# Patient Record
Sex: Female | Born: 1935 | Race: Black or African American | Hispanic: No | Smoking: Never smoker
Health system: Southern US, Community
[De-identification: ages and names within clinical notes are randomized; demographics above are authoritative.]

## PROBLEM LIST (undated history)

## (undated) DIAGNOSIS — E1110 Type 2 diabetes mellitus with ketoacidosis without coma: Secondary | ICD-10-CM

## (undated) DIAGNOSIS — R002 Palpitations: Secondary | ICD-10-CM

## (undated) DIAGNOSIS — G4700 Insomnia, unspecified: Secondary | ICD-10-CM

## (undated) DIAGNOSIS — E785 Hyperlipidemia, unspecified: Secondary | ICD-10-CM

## (undated) DIAGNOSIS — I10 Essential (primary) hypertension: Secondary | ICD-10-CM

## (undated) DIAGNOSIS — K219 Gastro-esophageal reflux disease without esophagitis: Secondary | ICD-10-CM

## (undated) DIAGNOSIS — E1140 Type 2 diabetes mellitus with diabetic neuropathy, unspecified: Secondary | ICD-10-CM

## (undated) HISTORY — DX: Hyperlipidemia, unspecified: E78.5

## (undated) HISTORY — PX: COLONOSCOPY: SHX174

## (undated) HISTORY — DX: Type 2 diabetes mellitus with diabetic neuropathy, unspecified: E11.40

## (undated) HISTORY — DX: Palpitations: R00.2

## (undated) HISTORY — PX: DILATION AND CURETTAGE OF UTERUS: SHX78

## (undated) HISTORY — PX: EYE SURGERY: SHX253

## (undated) HISTORY — DX: Insomnia, unspecified: G47.00

## (undated) HISTORY — DX: Essential (primary) hypertension: I10

---

## 1998-05-15 ENCOUNTER — Emergency Department (HOSPITAL_COMMUNITY): Admission: EM | Admit: 1998-05-15 | Discharge: 1998-05-15 | Payer: Self-pay | Admitting: Emergency Medicine

## 1998-06-29 ENCOUNTER — Encounter: Admission: RE | Admit: 1998-06-29 | Discharge: 1998-06-29 | Payer: Self-pay | Admitting: *Deleted

## 2002-11-13 HISTORY — PX: LAPAROSCOPIC APPENDECTOMY: SUR753

## 2002-11-13 HISTORY — PX: UMBILICAL HERNIA REPAIR: SHX196

## 2003-04-13 ENCOUNTER — Emergency Department (HOSPITAL_COMMUNITY): Admission: EM | Admit: 2003-04-13 | Discharge: 2003-04-13 | Payer: Self-pay | Admitting: Emergency Medicine

## 2003-04-14 ENCOUNTER — Encounter: Payer: Self-pay | Admitting: Emergency Medicine

## 2003-08-10 LAB — HM COLONOSCOPY: HM Colonoscopy: ABNORMAL

## 2003-08-19 ENCOUNTER — Ambulatory Visit (HOSPITAL_COMMUNITY): Admission: RE | Admit: 2003-08-19 | Discharge: 2003-08-19 | Payer: Self-pay | Admitting: Gastroenterology

## 2003-08-19 ENCOUNTER — Encounter: Payer: Self-pay | Admitting: Gastroenterology

## 2003-09-19 ENCOUNTER — Inpatient Hospital Stay (HOSPITAL_COMMUNITY): Admission: EM | Admit: 2003-09-19 | Discharge: 2003-09-20 | Payer: Self-pay | Admitting: Emergency Medicine

## 2003-09-20 ENCOUNTER — Encounter (INDEPENDENT_AMBULATORY_CARE_PROVIDER_SITE_OTHER): Payer: Self-pay | Admitting: Specialist

## 2004-03-02 ENCOUNTER — Ambulatory Visit (HOSPITAL_COMMUNITY): Admission: RE | Admit: 2004-03-02 | Discharge: 2004-03-02 | Payer: Self-pay

## 2006-11-28 ENCOUNTER — Ambulatory Visit: Payer: Self-pay | Admitting: Endocrinology

## 2006-12-19 ENCOUNTER — Ambulatory Visit: Payer: Self-pay | Admitting: Endocrinology

## 2007-01-09 ENCOUNTER — Ambulatory Visit: Payer: Self-pay | Admitting: Endocrinology

## 2007-01-30 ENCOUNTER — Ambulatory Visit: Payer: Self-pay | Admitting: Endocrinology

## 2007-01-30 LAB — CONVERTED CEMR LAB: TSH: 0.68 microintl units/mL (ref 0.35–5.50)

## 2007-02-12 ENCOUNTER — Ambulatory Visit: Payer: Self-pay | Admitting: Endocrinology

## 2007-02-19 ENCOUNTER — Ambulatory Visit: Payer: Self-pay

## 2007-02-19 HISTORY — PX: OTHER SURGICAL HISTORY: SHX169

## 2007-03-05 ENCOUNTER — Ambulatory Visit: Payer: Self-pay | Admitting: Endocrinology

## 2007-05-07 ENCOUNTER — Ambulatory Visit: Payer: Self-pay | Admitting: Endocrinology

## 2007-05-07 LAB — CONVERTED CEMR LAB
BUN: 14 mg/dL (ref 6–23)
CO2: 30 meq/L (ref 19–32)
Calcium: 10.2 mg/dL (ref 8.4–10.5)
Chloride: 103 meq/L (ref 96–112)
Creatinine, Ser: 0.9 mg/dL (ref 0.4–1.2)
Creatinine,U: 77.7 mg/dL
GFR calc Af Amer: 80 mL/min
GFR calc non Af Amer: 66 mL/min
Glucose, Bld: 94 mg/dL (ref 70–99)
Hgb A1c MFr Bld: 8.4 % — ABNORMAL HIGH (ref 4.6–6.0)
Microalb Creat Ratio: 2.6 mg/g (ref 0.0–30.0)
Microalb, Ur: 0.2 mg/dL (ref 0.0–1.9)
Potassium: 4.3 meq/L (ref 3.5–5.1)
Sodium: 141 meq/L (ref 135–145)

## 2007-08-13 ENCOUNTER — Encounter: Payer: Self-pay | Admitting: *Deleted

## 2007-08-13 DIAGNOSIS — I10 Essential (primary) hypertension: Secondary | ICD-10-CM | POA: Insufficient documentation

## 2007-08-13 DIAGNOSIS — R635 Abnormal weight gain: Secondary | ICD-10-CM | POA: Insufficient documentation

## 2007-08-13 DIAGNOSIS — E1110 Type 2 diabetes mellitus with ketoacidosis without coma: Secondary | ICD-10-CM

## 2007-08-13 DIAGNOSIS — G4700 Insomnia, unspecified: Secondary | ICD-10-CM

## 2007-08-13 DIAGNOSIS — E1040 Type 1 diabetes mellitus with diabetic neuropathy, unspecified: Secondary | ICD-10-CM | POA: Insufficient documentation

## 2007-08-13 DIAGNOSIS — E785 Hyperlipidemia, unspecified: Secondary | ICD-10-CM | POA: Insufficient documentation

## 2007-08-13 HISTORY — DX: Type 2 diabetes mellitus with ketoacidosis without coma: E11.10

## 2007-08-13 HISTORY — DX: Insomnia, unspecified: G47.00

## 2007-08-13 HISTORY — DX: Hyperlipidemia, unspecified: E78.5

## 2007-08-13 HISTORY — DX: Essential (primary) hypertension: I10

## 2007-08-14 ENCOUNTER — Encounter: Payer: Self-pay | Admitting: Endocrinology

## 2007-08-14 ENCOUNTER — Ambulatory Visit: Payer: Self-pay | Admitting: Endocrinology

## 2007-08-14 LAB — CONVERTED CEMR LAB: Hgb A1c MFr Bld: 7.7 % — ABNORMAL HIGH (ref 4.6–6.0)

## 2007-11-15 ENCOUNTER — Ambulatory Visit: Payer: Self-pay | Admitting: Endocrinology

## 2007-11-15 LAB — CONVERTED CEMR LAB: Hgb A1c MFr Bld: 7.7 % — ABNORMAL HIGH (ref 4.6–6.0)

## 2007-12-04 ENCOUNTER — Telehealth: Payer: Self-pay | Admitting: Internal Medicine

## 2008-01-02 ENCOUNTER — Encounter: Payer: Self-pay | Admitting: Endocrinology

## 2008-01-09 ENCOUNTER — Encounter: Payer: Self-pay | Admitting: Endocrinology

## 2008-03-16 ENCOUNTER — Ambulatory Visit: Payer: Self-pay | Admitting: Endocrinology

## 2008-03-16 DIAGNOSIS — M79609 Pain in unspecified limb: Secondary | ICD-10-CM | POA: Insufficient documentation

## 2008-03-16 LAB — CONVERTED CEMR LAB: Hgb A1c MFr Bld: 7.6 % — ABNORMAL HIGH (ref 4.6–6.0)

## 2008-06-15 ENCOUNTER — Ambulatory Visit: Payer: Self-pay | Admitting: Endocrinology

## 2008-06-15 LAB — CONVERTED CEMR LAB: Hgb A1c MFr Bld: 7.4 % — ABNORMAL HIGH (ref 4.6–6.0)

## 2008-06-17 ENCOUNTER — Telehealth: Payer: Self-pay | Admitting: Endocrinology

## 2008-06-18 ENCOUNTER — Ambulatory Visit: Payer: Self-pay

## 2008-06-18 ENCOUNTER — Encounter: Payer: Self-pay | Admitting: Endocrinology

## 2008-09-17 ENCOUNTER — Ambulatory Visit: Payer: Self-pay | Admitting: Endocrinology

## 2008-09-17 LAB — CONVERTED CEMR LAB: Hgb A1c MFr Bld: 7.3 % — ABNORMAL HIGH (ref 4.6–6.0)

## 2008-12-17 ENCOUNTER — Ambulatory Visit: Payer: Self-pay | Admitting: Endocrinology

## 2008-12-17 LAB — CONVERTED CEMR LAB: Hgb A1c MFr Bld: 7.6 % — ABNORMAL HIGH (ref 4.6–6.0)

## 2009-03-03 ENCOUNTER — Encounter: Payer: Self-pay | Admitting: Endocrinology

## 2009-03-18 ENCOUNTER — Ambulatory Visit: Payer: Self-pay | Admitting: Endocrinology

## 2009-03-18 LAB — CONVERTED CEMR LAB: Hgb A1c MFr Bld: 7.5 % — ABNORMAL HIGH (ref 4.6–6.5)

## 2009-04-14 ENCOUNTER — Telehealth (INDEPENDENT_AMBULATORY_CARE_PROVIDER_SITE_OTHER): Payer: Self-pay | Admitting: *Deleted

## 2009-05-05 ENCOUNTER — Encounter: Payer: Self-pay | Admitting: Endocrinology

## 2009-06-17 ENCOUNTER — Ambulatory Visit: Payer: Self-pay | Admitting: Endocrinology

## 2009-06-17 LAB — CONVERTED CEMR LAB
Creatinine,U: 113.4 mg/dL
Hgb A1c MFr Bld: 8 % — ABNORMAL HIGH (ref 4.6–6.5)
Microalb Creat Ratio: 1.8 mg/g (ref 0.0–30.0)
Microalb, Ur: 0.2 mg/dL (ref 0.0–1.9)

## 2009-09-17 ENCOUNTER — Ambulatory Visit: Payer: Self-pay | Admitting: Endocrinology

## 2009-09-17 LAB — CONVERTED CEMR LAB: Hgb A1c MFr Bld: 8.3 % — ABNORMAL HIGH (ref 4.6–6.5)

## 2009-12-17 ENCOUNTER — Ambulatory Visit: Payer: Self-pay | Admitting: Endocrinology

## 2009-12-17 LAB — CONVERTED CEMR LAB: Hgb A1c MFr Bld: 8.4 % — ABNORMAL HIGH (ref 4.6–6.5)

## 2010-02-07 ENCOUNTER — Telehealth: Payer: Self-pay | Admitting: Endocrinology

## 2010-03-18 ENCOUNTER — Ambulatory Visit: Payer: Self-pay | Admitting: Endocrinology

## 2010-03-18 LAB — CONVERTED CEMR LAB: Hgb A1c MFr Bld: 7.9 % — ABNORMAL HIGH (ref 4.6–6.5)

## 2010-08-26 ENCOUNTER — Ambulatory Visit: Payer: Self-pay | Admitting: Endocrinology

## 2010-08-26 LAB — CONVERTED CEMR LAB: Hgb A1c MFr Bld: 8.4 % — ABNORMAL HIGH (ref 4.6–6.5)

## 2010-09-23 ENCOUNTER — Emergency Department (HOSPITAL_COMMUNITY): Admission: EM | Admit: 2010-09-23 | Discharge: 2010-09-23 | Payer: Self-pay | Admitting: *Deleted

## 2010-09-23 ENCOUNTER — Emergency Department (HOSPITAL_COMMUNITY): Admission: EM | Admit: 2010-09-23 | Discharge: 2010-09-23 | Payer: Self-pay | Admitting: Emergency Medicine

## 2010-10-31 ENCOUNTER — Encounter
Admission: RE | Admit: 2010-10-31 | Discharge: 2010-10-31 | Payer: Self-pay | Source: Home / Self Care | Attending: Orthopaedic Surgery | Admitting: Orthopaedic Surgery

## 2010-11-17 ENCOUNTER — Encounter: Payer: Self-pay | Admitting: Endocrinology

## 2010-11-18 ENCOUNTER — Telehealth (INDEPENDENT_AMBULATORY_CARE_PROVIDER_SITE_OTHER): Payer: Self-pay | Admitting: *Deleted

## 2010-11-25 ENCOUNTER — Ambulatory Visit
Admission: RE | Admit: 2010-11-25 | Discharge: 2010-11-25 | Payer: Self-pay | Source: Home / Self Care | Attending: Endocrinology | Admitting: Endocrinology

## 2010-11-25 ENCOUNTER — Other Ambulatory Visit: Payer: Self-pay | Admitting: Endocrinology

## 2010-11-25 LAB — HEMOGLOBIN A1C: Hgb A1c MFr Bld: 8.4 % — ABNORMAL HIGH (ref 4.6–6.5)

## 2010-12-08 ENCOUNTER — Telehealth (INDEPENDENT_AMBULATORY_CARE_PROVIDER_SITE_OTHER): Payer: Self-pay | Admitting: *Deleted

## 2010-12-12 ENCOUNTER — Encounter: Payer: Self-pay | Admitting: Endocrinology

## 2010-12-13 NOTE — Assessment & Plan Note (Signed)
Summary: 3 MO ROV /$50 /NWS   Vital Signs:  Patient Profile:   75 Years Old Female Weight:      190.0 pounds O2 Sat:      97 % O2 treatment:    Room Air Temp:     97.1 degrees F oral Pulse rate:   72 / minute BP sitting:   122 / 78  (left arm) Cuff size:   regular  Pt. in pain?   no  Vitals Entered By: Orlan Leavens (September 17, 2008 1:06 PM)                  Referred by:  shelton PCP:  shelton  Chief Complaint:  3 month follow-up/ also want flu shot.  History of Present Illness: pt feels well.  she brings a record of her cbg's which i have reviewed today.  it varies from 70's (before lunch) to mid-100's (am).  better overall.    Prior Medications Reviewed Using: Patient Recall  Updated Prior Medication List: HUMALOG 100 UNIT/ML  SOLN (INSULIN LISPRO (HUMAN)) qac three times a day) 06-22-12 units ACTOS 15 MG  TABS (PIOGLITAZONE HCL) take 1 by mouth qd NEURONTIN 100 MG  CAPS (GABAPENTIN) take 1 by mouth three times a day qd PRAVASTATIN SODIUM 20 MG  TABS (PRAVASTATIN SODIUM) take 1 by mouth qd METFORMIN HCL 1000 MG  TABS (METFORMIN HCL) take 1 by mouth q am & 1/2 by mouth qhs AMBIEN 5 MG  TABS (ZOLPIDEM TARTRATE) take 1 by mouth at bedtime prn METFORMIN HCL 500 MG TABS (METFORMIN HCL) Take 1/2 tablet by mouth at bedtime RELION N 100 UNIT/ML SUSP (INSULIN ISOPHANE HUMAN) Inject 12 unit subcutaneously at bedtime ZIAC 2.5-6.25 MG TABS (BISOPROLOL-HYDROCHLOROTHIAZIDE) Take 1 tablet by mouth once a day METFORMIN HCL 500 MG  TB24 (METFORMIN HCL) take 2 by mouth q am and 1 pm once daily BD U/F SHORT PEN NEEDLE 31G X 8 MM  MISC (INSULIN PEN NEEDLE) use as directed three times a day  Current Allergies: No known allergies   Past Medical History:    Reviewed history from 06/15/2008 and no changes required:       FOOT PAIN, RIGHT (ICD-729.5)       WEIGHT GAIN (ICD-783.1)       INSOMNIA (ICD-780.52)       HYPERTENSION (ICD-401.9)       HYPERLIPIDEMIA (ICD-272.4)  DIABETES MELLITUS, TYPE I (ICD-250.01)            Review of Systems  The patient denies hypoglycemia and syncope.     Physical Exam  General:     well developed, well nourished, in no acute distress Psych:     alert and cooperative; normal mood and affect; normal attention span and concentration Additional Exam:     A1C%                 [H]  7.3 %      Impression & Recommendations:  Problem # 1:  DIABETES MELLITUS, TYPE I (ICD-250.01)  Medications Added to Medication List This Visit: 1)  Humalog 100 Unit/ml Soln (Insulin lispro (human)) .... Qac three times a day) 05-22-12 units  Other Orders: Flu Vaccine 74yrs + (65784) Administration Flu vaccine (O9629)   Patient Instructions: 1)  reduce humalog to (just before each meal) 05-23-11 2)  same nph (8 at night) 3)  ret 3 mos   Flu Vaccine Consent Questions     Do you have a history of severe allergic  reactions to this vaccine? no    Any prior history of allergic reactions to egg and/or gelatin? no    Do you have a sensitivity to the preservative Thimersol? no    Do you have a past history of Guillan-Barre Syndrome? no    Do you currently have an acute febrile illness? no    Have you ever had a severe reaction to latex? no    Vaccine information given and explained to patient? yes    Are you currently pregnant? no    Lot Number:[id number]   Exp Date:05/12/2009   Site Given Right Deltoid IM   ]

## 2010-12-13 NOTE — Medication Information (Signed)
Summary: Diabetic supplies/[facility]  Diabetic supplies/[facility]   Imported By: Lester Marvell 03/09/2009 09:34:41  _____________________________________________________________________  External Attachment:    Type:   Image     Comment:   External Document

## 2010-12-13 NOTE — Progress Notes (Signed)
  Phone Note Refill Request Message from:  Fax from Pharmacy on February 07, 2010 8:10 AM  Refills Requested: Medication #1:  HUMALOG KWIKPEN 100 UNIT/ML SOLN qac (three times a day) 06-21-13 units.   Dosage confirmed as above?Dosage Confirmed Initial call taken by: Josph Macho RMA,  February 07, 2010 8:10 AM    Prescriptions: HUMALOG KWIKPEN 100 UNIT/ML SOLN (INSULIN LISPRO (HUMAN)) qac (three times a day) 06-21-13 units  #1 box x 6   Entered by:   Josph Macho RMA   Authorized by:   Minus Breeding MD   Signed by:   Josph Macho RMA on 02/07/2010   Method used:   Electronically to        [facility]. [phone]* (retail)       [address]       [place], [place]  [address]       Ph: [phone]       Fax: [phone]   RxID:   [id number]

## 2010-12-13 NOTE — Medication Information (Signed)
Summary: Rx for Diabetic Supplies/[facility]  Rx for Diabetic Supplies/[facility]   Imported By: Esmeralda Links D'jimraou 01/13/2008 12:32:31  _____________________________________________________________________  External Attachment:    Type:   Image     Comment:   External Document

## 2010-12-13 NOTE — Assessment & Plan Note (Signed)
Summary: 3 MO ROV/NWS    Vital Signs:  Patient Profile:   75 Years Old Female Weight:      183.8 pounds Temp:     98.7 degrees F oral Pulse rate:   71 / minute BP sitting:   137 / 67  (right arm) Cuff size:   large  Vitals Entered By: Orlan Leavens (November 15, 2007 11:17 AM)                 Visit Type:  Follow-up Visit Referred by:  shelton PCP:  shelton  Chief Complaint:  dm.  History of Present Illness: patient states she has been feeling well recently.  She also states her diabetes has been well-controlled.  She brings with her record of her home glucoses, which I have reviewed today.  She has a few numbers below 100 before breakfast and occasionally before lunch, and are slightly higher before supper and bedtime.  However, the vast majority of her glucoses are in the low 100s, with a few in the mid 100s.  Current Allergies: No known allergies   Past Medical History:    Reviewed history from 08/13/2007 and no changes required:       Diabetes mellitus, type I       Hyperlipidemia       Hypertension     Review of Systems       denies hypoglycemia   Physical Exam  General:     obese.   Pulses:     dorsalis pedis intact bilat.   Extremities:     no deformity.  no ulcer on the feet.  feet are of normal color and temp.  no edema  Neurologic:     sensation is intact to touch on the feet Additional Exam:     a1c=7.7    Impression & Recommendations:  Problem # 1:  DIABETES MELLITUS, TYPE I (ICD-250.01)  Her updated medication list for this problem includes:    Humalog 100 Unit/ml Soln (Insulin lispro (human)) ..... See office notes    Actos 15 Mg Tabs (Pioglitazone hcl) .Marland Kitchen... Take 1 by mouth qd    Metformin Hcl 1000 Mg Tabs (Metformin hcl) .Marland Kitchen... Take 1 by mouth q am & 1/2 by mouth qhs    Novolog 100 Unit/ml Soln (Insulin aspart) ..... See office notes  Orders: TLB-A1C / Hgb A1C (Glycohemoglobin) (83036-A1C) Est. Patient Level III  (60630)      Patient Instructions: 1)  continue NPH insulin 7 units nightly 2)  increase Humalog to 8 units with breakfast, 10 with lunch, and 12 with supper. 3)  Return 4 months    ]  Appended Document: 3 MO ROV/NWS Faxed notes to Dr. Renae Gloss @ [phone]/LMB

## 2010-12-13 NOTE — Assessment & Plan Note (Signed)
Summary: 3 MOS F/U /# / CD  RS'D PER PT TO OCT/NWS   Vital Signs:  Patient profile:   75 year old female Height:      65 inches (165.10 cm) Weight:      176.75 pounds (80.34 kg) BMI:     29.52 O2 Sat:      98 % on Room air Temp:     97.9 degrees F (36.61 degrees C) oral Pulse rate:   69 / minute BP sitting:   132 / 76  (left arm) Cuff size:   regular  Vitals Entered By: Brenton Grills MA (August 26, 2010 1:20 PM)  O2 Flow:  Room air CC: 3 month F/U/aj Is Patient Diabetic? Yes   Referring Provider:  shelton Primary Provider:  shelton  CC:  3 month F/U/aj.  History of Present Illness: [name] brings a record of her cbg's which i have reviewed today.  most are in the 100's.  it is below 100 at hs, and before supper.  pt states [name] feels well in general.  Current Medications (verified): 1)  Neurontin 100 Mg  Caps (Gabapentin) .... Take 1 By Mouth Three Times A Day Qd 2)  Pravastatin Sodium 20 Mg  Tabs (Pravastatin Sodium) .... Take 1 By Mouth Qd 3)  Relion N 100 Unit/ml Susp (Insulin Isophane Human) .... Inject 9 Unit Subcutaneously At Bedtime 4)  Ziac 2.5-6.25 Mg Tabs (Bisoprolol-Hydrochlorothiazide) .... Take 1 Tablet By Mouth Once A Day 5)  Metformin Hcl 500 Mg  Tb24 (Metformin Hcl) .... Take 2 By Mouth Q Am and 1 Pm Once Daily 6)  Bd U/f Short Pen Needle 31g X 8 Mm  Misc (Insulin Pen Needle) .... Use As Directed 4 X A Day (Any Brand) 7)  Humalog Kwikpen 100 Unit/ml Soln (Insulin Lispro (Human)) .... Three Times A Day (Just Before Each Meal)  06-22-12 Units, and Pen Needles 4x A Day  Allergies (verified): No Known Drug Allergies  Past History:  Past Medical History: Last updated: 06/15/2008 FOOT PAIN, RIGHT (ICD-729.5) WEIGHT GAIN (ICD-783.1) INSOMNIA (ICD-780.52) HYPERTENSION (ICD-401.9) HYPERLIPIDEMIA (ICD-272.4) DIABETES MELLITUS, TYPE I (ICD-250.01)  Review of Systems  The patient denies hypoglycemia.    Physical Exam  General:  normal appearance.   Pulses:   dorsalis pedis intact bilat.   Extremities:  no deformity.  no ulcer on the feet.  feet are of normal color and temp.  no edema  Neurologic:  sensation is intact to touch on the feet  Additional Exam:   Hemoglobin A1C       [H]  8.4 %       Impression & Recommendations:  Problem # 1:  DIABETES MELLITUS, TYPE I (ICD-250.01) [name] needs some adjustment in her therapy  Medications Added to Medication List This Visit: 1)  Relion N 100 Unit/ml Susp (Insulin isophane human) .... Inject 12 units subcutaneously at bedtime 2)  Humalog Kwikpen 100 Unit/ml Soln (Insulin lispro (human)) .... Three times a day (just before each meal)  07-22-12 units, and pen needles 4x a day  Other Orders: Flu Vaccine 2yrs + MEDICARE PATIENTS (Z6109) Administration Flu vaccine - MCR (G0008) TLB-A1C / Hgb A1C (Glycohemoglobin) (83036-A1C) Est. Patient Level III ([address])  Patient Instructions: 1)  tests are being ordered for you today.  a few days after the test(s), please call [phone] to hear your test results. 2)  pending the test results: 3)  increase nph to 12 units at bedtime 4)  Please schedule a follow-up appointment in 3 months.  5)  reduce humalog to (just before each meal) 07-22-12 units. 6)  (update: i left message on phone-tree:  rx as we discussed) Prescriptions: HUMALOG KWIKPEN 100 UNIT/ML SOLN (INSULIN LISPRO (HUMAN)) three times a day (just before each meal)  07-22-12 units, and pen needles 4x a day  #1 box x 11   Entered and Authorized by:   Minus Breeding MD   Signed by:   Minus Breeding MD on 08/26/2010   Method used:   Print then Give to Patient   RxID:   [id number]  Flu Vaccine Consent Questions     Do you have a history of severe allergic reactions to this vaccine? no    Any prior history of allergic reactions to egg and/or gelatin? no    Do you have a sensitivity to the preservative Thimersol? no    Do you have a past history of Guillan-Barre Syndrome? no    Do you currently have an  acute febrile illness? no    Have you ever had a severe reaction to latex? no    Vaccine information given and explained to patient? yes    Are you currently pregnant? no    Lot Number:[id number]   Exp Date:05/13/2011   Site Given  Right Deltoid IMu1

## 2010-12-13 NOTE — Medication Information (Signed)
Summary: Rx for Diabetic Supplies/[facility]  Rx for Diabetic Supplies/[facility]   Imported By: Esmeralda Links D'jimraou 01/15/2008 14:47:54  _____________________________________________________________________  External Attachment:    Type:   Image     Comment:   External Document

## 2010-12-13 NOTE — Medication Information (Signed)
Summary: Glucose Testing Supplies/[facility]  Glucose Testing Supplies/[facility]   Imported By: Sherian Rein 05/19/2009 08:28:09  _____________________________________________________________________  External Attachment:    Type:   Image     Comment:   External Document

## 2010-12-13 NOTE — Assessment & Plan Note (Signed)
Summary: 4 MTH FU-STC   Vital Signs:  Patient Profile:   75 Years Old Female Weight:      188.0 pounds Temp:     98.3 degrees F oral Pulse rate:   76 / minute BP sitting:   126 / 70  (left arm) Cuff size:   regular  Vitals Entered By: Orlan Leavens (Mar 16, 2008 11:09 AM)                 Referred by:  shelton PCP:  shelton  Chief Complaint:  4 MONTH FOLLOW-UP.  History of Present Illness: patient says she's feeling well in general. She currently takes Humalog with breakfast, 10 with lunch, and 13 with supper. She also takes NPH 7 units q.h.s. She brings with her extensive record of her home glucoses which I have reviewed today. patient states 3 months of slight pain of the right foot, worse in the context of inversion of the foot. Its worst at the forefoot, but not in the toes.    Current Allergies: No known allergies   Past Medical History:    Reviewed history from 08/13/2007 and no changes required:       Diabetes mellitus, type I       Hyperlipidemia       Hypertension     Review of Systems  The patient denies suspicious skin lesions.         no hypoglycemia   Physical Exam  General:     normal appearance.   Pulses:     dorsalis pedis intact bilat.  no carotid bruit  Extremities:     no deformity.  no ulcer on the feet.  feet are of normal color and temp.  no edema  Neurologic:     sensation is intact to touch on the feet  Skin:     insulin injection sites at anterior abdomen are normal  Additional Exam:     A1C%                 [H]  7.6 %              Impression & Recommendations:  Problem # 1:  DIABETES MELLITUS, TYPE I (ICD-250.01)  The following medications were removed from the medication list:    Novolog 100 Unit/ml Soln (Insulin aspart) ..... See office notes  Her updated medication list for this problem includes:    Humalog 100 Unit/ml Soln (Insulin lispro (human)) ..... Qac three times a day) 06-22-12 units    Actos 15 Mg Tabs  (Pioglitazone hcl) .Marland Kitchen... Take 1 by mouth qd    Metformin Hcl 1000 Mg Tabs (Metformin hcl) .Marland Kitchen... Take 1 by mouth q am & 1/2 by mouth qhs    Metformin Hcl 500 Mg Tabs (Metformin hcl) .Marland Kitchen... Take 1/2 tablet by mouth at bedtime    Relion N 100 Unit/ml Susp (Insulin isophane human) ..... Inject 8 unit subcutaneously at bedtime    Metformin Hcl 500 Mg Tb24 (Metformin hcl) .Marland Kitchen... Take 2 by mouth q am and 1 pm once daily  Orders: TLB-A1C / Hgb A1C (Glycohemoglobin) (83036-A1C) Est. Patient Level IV ([address])   Problem # 2:  FOOT PAIN, RIGHT (ICD-729.5)  Orders: Podiatry Referral (Podiatry)   Medications Added to Medication List This Visit: 1)  Humalog 100 Unit/ml Soln (Insulin lispro (human)) .... Qac three times a day) 06-22-12 units 2)  Relion N 100 Unit/ml Susp (Insulin isophane human) .... Inject 7 unit subcutaneously at bedtime 3)  Relion N 100 Unit/ml Susp (Insulin isophane human) .... Inject 8 unit subcutaneously at bedtime   Patient Instructions: 1)  increase nph to 8 units 2)  same humalog 3)  ret 3 mos 4)  cc dr Kirtland Bouchard shelton 5)  ref podiatry    ]  Appended Document: 4 MTH FU-STC faxed notes to dr shelton @ [phone]/lmb

## 2010-12-13 NOTE — Assessment & Plan Note (Signed)
Summary: 3 MTH FU  STC   Vital Signs:  Patient profile:   75 year old female Height:      65 inches (165.10 cm) Weight:      178.25 pounds (81.02 kg) O2 Sat:      98 % on Room air Temp:     97.1 degrees F (36.17 degrees C) oral Pulse rate:   68 / minute BP sitting:   124 / 84  (left arm) Cuff size:   large  Vitals Entered By: Josph Macho CMA (December 17, 2009 12:58 PM)  O2 Flow:  Room air CC: 3 month follow up/ pt states she is no longer taking Ambien/ CF Is Patient Diabetic? Yes   Referring Provider:  shelton Primary Provider:  shelton  CC:  3 month follow up/ pt states she is no longer taking Ambien/ CF.  History of Present Illness: pt states she feels well in general.  she brings a record of her cbg's which i have reviewed today.  it is highest in am (mostly mid-100's) and lowest at hs (80-90).  it is lower before supper than before lunch.  Current Medications (verified): 1)  Neurontin 100 Mg  Caps (Gabapentin) .... Take 1 By Mouth Three Times A Day Qd 2)  Pravastatin Sodium 20 Mg  Tabs (Pravastatin Sodium) .... Take 1 By Mouth Qd 3)  Ambien 5 Mg  Tabs (Zolpidem Tartrate) .... Take 1 By Mouth At Bedtime Prn 4)  Metformin Hcl 500 Mg Tabs (Metformin Hcl) .... Take 1/2 Tablet By Mouth At Bedtime 5)  Relion N 100 Unit/ml Susp (Insulin Isophane Human) .... Inject 9 Unit Subcutaneously At Bedtime 6)  Ziac 2.5-6.25 Mg Tabs (Bisoprolol-Hydrochlorothiazide) .... Take 1 Tablet By Mouth Once A Day 7)  Metformin Hcl 500 Mg  Tb24 (Metformin Hcl) .... Take 2 By Mouth Q Am and 1 Pm Once Daily 8)  Bd U/f Short Pen Needle 31g X 8 Mm  Misc (Insulin Pen Needle) .... Use As Directed Three Times A Day 9)  Humalog Kwikpen 100 Unit/ml Soln (Insulin Lispro (Human)) .... Qac (Three Times A Day) 06-21-13 Units  Allergies (verified): No Known Drug Allergies  Past History:  Past Medical History: Last updated: 06/15/2008 FOOT PAIN, RIGHT (ICD-729.5) WEIGHT GAIN (ICD-783.1) INSOMNIA  (ICD-780.52) HYPERTENSION (ICD-401.9) HYPERLIPIDEMIA (ICD-272.4) DIABETES MELLITUS, TYPE I (ICD-250.01)  Review of Systems  The patient denies hypoglycemia.    Physical Exam  General:  normal appearance.   Skin:  insulin injection sites at anterior abdomen are normal  Additional Exam:  Hemoglobin A1C       [H]  8.4 %   Impression & Recommendations:  Problem # 1:  DIABETES MELLITUS, TYPE I (ICD-250.01) needs increased rx  Other Orders: TLB-A1C / Hgb A1C (Glycohemoglobin) (83036-A1C) Est. Patient Level III ([address])  Patient Instructions: 1)  pending the test results: 2)  increase nph to 11 units at bedtime 3)  Please schedule a follow-up appointment in 3 months. 4)  tests are being ordered for you today.  a few days after the test(s), please call [phone] to hear your test results. 5)  take humalog to (just before each meal) 07-22-12 units 6)  here are some sample pens of "apidra" (it is very interchangeable with "humalog").  Preventive Care Screening  Mammogram:    Date:  08/13/2009    Results:  historical

## 2010-12-13 NOTE — Assessment & Plan Note (Signed)
   Vital Signs:  Patient Profile:   75 Years Old Female Weight:      180 pounds Temp:     98 degrees F Pulse rate:   74 / minute BP sitting:   113 / 66                 Visit Type:  f/u Referred by:  shelton PCP:  shelton  Chief Complaint:  dm.  History of Present Illness: patient is a 75 year old woman who states her home glucoses have significantly improved since her last visit.  She brings with her and extensive record of her home glucoses which varied between the 70s in the low 100s.  However, there in the mid 100s in the morning, which is higher than they are at bedtime.  Current Allergies: No known allergies   Past Medical History:    Reviewed history from 08/13/2007 and no changes required:       Diabetes mellitus, type I       Hyperlipidemia       Hypertension     Review of Systems       she denies hypoglycemia     Impression & Recommendations:  Problem # 1:  DIABETES MELLITUS, TYPE I (ICD-250.01)  Her updated medication list for this problem includes:    Humalog 100 Unit/ml Soln (Insulin lispro (human)) ..... See office notes    Actos 15 Mg Tabs (Pioglitazone hcl) .Marland Kitchen... Take 1 by mouth qd    Metformin Hcl 1000 Mg Tabs (Metformin hcl) .Marland Kitchen... Take 1 by mouth q am & 1/2 by mouth qhs    Novolog 100 Unit/ml Soln (Insulin aspart) ..... See office notes  Labs Reviewed: HgBA1c: 7.7 (08/14/2007)   Creat: 0.9 (05/07/2007)      Complete Medication List: 1)  Humalog 100 Unit/ml Soln (Insulin lispro (human)) .... See office notes 2)  Actos 15 Mg Tabs (Pioglitazone hcl) .... Take 1 by mouth qd 3)  Neurontin 100 Mg Caps (Gabapentin) .... Take 1 by mouth three times a day qd 4)  Pravastatin Sodium 20 Mg Tabs (Pravastatin sodium) .... Take 1 by mouth qd 5)  Metformin Hcl 1000 Mg Tabs (Metformin hcl) .... Take 1 by mouth q am & 1/2 by mouth qhs 6)  Novolog 100 Unit/ml Soln (Insulin aspart) .... See office notes 7)  Ambien 5 Mg Tabs (Zolpidem tartrate) .... Take 1  by mouth at bedtime prn   Patient Instructions: 1)  1 continue Humalog 8 breakfast 9 lunch 11 supper 2)  2 increase NPH to 7 units nightly 3)  Please schedule a follow-up appointment in 3 months. 4)  3 continue your Actos and metformin at their current dosages    ]

## 2010-12-13 NOTE — Assessment & Plan Note (Signed)
Summary: 3 MO ROV /NWS $50   Vital Signs:  Patient profile:   75 year old female Height:      65 inches (165.10 cm) Weight:      182 pounds (82.73 kg) O2 Sat:      95 % on Room air Temp:     97.8 degrees F (36.56 degrees C) oral Pulse rate:   71 / minute BP sitting:   118 / 70  (left arm) Cuff size:   regular  Vitals Entered By: Josph Macho CMA (September 17, 2009 1:11 PM)  O2 Flow:  Room air CC: 3 month follow up/ flu vax today/ CF Is Patient Diabetic? Yes   Referring Provider:  shelton Primary Provider:  shelton  CC:  3 month follow up/ flu vax today/ CF.  History of Present Illness: pt states she feels well in general.  she brings a record of her cbg's which i have reviewed today.  it is well-controlled, excpet for a 64 in the afternoon.  all other cbg's are in the low-100's.  Current Medications (verified): 1)  Humalog 100 Unit/ml  Soln (Insulin Lispro (Human)) .... Qac Three Times A Day) 05-22-12 Units 2)  Neurontin 100 Mg  Caps (Gabapentin) .... Take 1 By Mouth Three Times A Day Qd 3)  Pravastatin Sodium 20 Mg  Tabs (Pravastatin Sodium) .... Take 1 By Mouth Qd 4)  Metformin Hcl 1000 Mg  Tabs (Metformin Hcl) .... Take 1 By Mouth Q Am & 1/2 By Mouth Qhs 5)  Ambien 5 Mg  Tabs (Zolpidem Tartrate) .... Take 1 By Mouth At Bedtime Prn 6)  Metformin Hcl 500 Mg Tabs (Metformin Hcl) .... Take 1/2 Tablet By Mouth At Bedtime 7)  Relion N 100 Unit/ml Susp (Insulin Isophane Human) .... Inject 9 Unit Subcutaneously At Bedtime 8)  Ziac 2.5-6.25 Mg Tabs (Bisoprolol-Hydrochlorothiazide) .... Take 1 Tablet By Mouth Once A Day 9)  Metformin Hcl 500 Mg  Tb24 (Metformin Hcl) .... Take 2 By Mouth Q Am and 1 Pm Once Daily 10)  Bd U/f Short Pen Needle 31g X 8 Mm  Misc (Insulin Pen Needle) .... Use As Directed Three Times A Day 11)  Humalog Pen 100 Unit/ml Soln (Insulin Lispro (Human)) .... Three Times A Day (Qac) 06-22-13 Units  Allergies (verified): No Known Drug Allergies  Past  History:  Past Medical History: Last updated: 06/15/2008 FOOT PAIN, RIGHT (ICD-729.5) WEIGHT GAIN (ICD-783.1) INSOMNIA (ICD-780.52) HYPERTENSION (ICD-401.9) HYPERLIPIDEMIA (ICD-272.4) DIABETES MELLITUS, TYPE I (ICD-250.01)  Review of Systems  The patient denies syncope.    Physical Exam  General:  normal appearance.   Pulses:  dorsalis pedis intact bilat.   Extremities:  no deformity.  no ulcer on the feet.  feet are of normal color and temp.  no edema  Neurologic:  sensation is intact to touch on the feet  Additional Exam:  Hemoglobin A1C       [H]  8.3 %   Impression & Recommendations:  Problem # 1:  DIABETES MELLITUS, TYPE I (ICD-250.01) needs increased rx  Medications Added to Medication List This Visit: 1)  Humalog Pen 100 Unit/ml Soln (Insulin lispro (human)) .... Three times a day (qac) 06-21-13 units 2)  Humalog Kwikpen 100 Unit/ml Soln (Insulin lispro (human)) .... Qac (three times a day) 06-21-13 units  Other Orders: Flu Vaccine 66yrs + (36644) Administration Flu vaccine - MCR (G0008) TLB-A1C / Hgb A1C (Glycohemoglobin) (83036-A1C) Est. Patient Level III (03474)  Patient Instructions: 1)  same nph (9 units  at bedtime) 2)  Please schedule a follow-up appointment in 3 months. 3)  tests are being ordered for you today.  a few days after the test(s), please call [phone] to hear your test results. 4)  decrease humalog to (just before each meal) 06-21-13 units 5)  (update: i left message on phone-tree:  increase nph to 10 units at bedtime) Flu Vaccine Consent Questions     Do you have a history of severe allergic reactions to this vaccine? no    Any prior history of allergic reactions to egg and/or gelatin? no    Do you have a sensitivity to the preservative Thimersol? no    Do you have a past history of Guillan-Barre Syndrome? no    Do you currently have an acute febrile illness? no    Have you ever had a severe reaction to latex? no    Vaccine information given  and explained to patient? yes    Are you currently pregnant? no    Lot Number:[id number]   Exp Date:05/12/2010   Site Given  Left Deltoid IMPrescriptions: HUMALOG KWIKPEN 100 UNIT/ML SOLN (INSULIN LISPRO (HUMAN)) qac (three times a day) 06-21-13 units  #1 box x 11   Entered and Authorized by:   Minus Breeding MD   Signed by:   Minus Breeding MD on 09/17/2009   Method used:   Print then Give to Patient   RxID:   [id number]   .[email or web address]

## 2010-12-13 NOTE — Progress Notes (Signed)
  Phone Note Outgoing Call   Call placed by: Ami Bullins CMA,  April 14, 2009 4:08 PM Summary of Call: Spoke with pt regarding her actos. MD advised pt to d/c the Actos since pt's insurance has blocked her from gettitng this. Pt mentioned that she did have about 60 left and wanted to know if she could take these until she ran out. Please advise.  Follow-up for Phone Call        ok Follow-up by: Minus Breeding MD,  April 14, 2009 4:16 PM  Additional Follow-up for Phone Call Additional follow up Details #1::        Informed pt that she could finish up rx of actos Additional Follow-up by: Ami Bullins CMA,  April 14, 2009 4:24 PM

## 2010-12-13 NOTE — Progress Notes (Signed)
Summary: REFILL  Medications Added METFORMIN HCL 500 MG TABS (METFORMIN HCL) Take 1/2 tablet by mouth at bedtime RELION N 100 UNIT/ML SUSP (INSULIN ISOPHANE HUMAN) Inject 3 unit subcutaneously at bedtime ZIAC 2.5-6.25 MG TABS (BISOPROLOL-HYDROCHLOROTHIAZIDE) Take 1 tablet by mouth once a day       Phone Note Call from Patient Call back at Home Phone [phone]   Caller: Patient/[phone] Call For: DR ELLISON/ANN Summary of Call: NEED SHORT PEN NEEDLES  CALLED IN TO [facility] [phone] PT STATES SHE IS OUT OF MEDS NEED TO TAKE HER INSULINE TO NIGHT Initial call taken by: Shelbie Proctor,  December 04, 2007 1:12 PM    New/Updated Medications: METFORMIN HCL 500 MG TABS (METFORMIN HCL) Take 1/2 tablet by mouth at bedtime RELION N 100 UNIT/ML SUSP (INSULIN ISOPHANE HUMAN) Inject 3 unit subcutaneously at bedtime ZIAC 2.5-6.25 MG TABS (BISOPROLOL-HYDROCHLOROTHIAZIDE) Take 1 tablet by mouth once a day   Prescriptions: HUMALOG 100 UNIT/ML  SOLN (INSULIN LISPRO (HUMAN)) see office notes  #1 x 6   Entered by:   Maris Berger   Authorized by:   Corwin Levins MD   Signed by:   Maris Berger on 12/04/2007   Method used:   Electronically sent to ...       [facility]. #[id number]*       [address]       [place], [place]  [address]       Ph: [phone]       Fax: [phone]   RxID:   [id number]

## 2010-12-13 NOTE — Assessment & Plan Note (Signed)
Summary: 3 MTH FU-$50-STC   Vital Signs:  Patient Profile:   75 Years Old Female Weight:      188.0 pounds Temp:     97.7 degrees F oral Pulse rate:   76 / minute BP sitting:   130 / 71  (left arm) Cuff size:   regular  Pt. in pain?   yes    Location:   Leg    Type:       sharp  Vitals Entered By: Orlan Leavens (June 15, 2008 1:03 PM)                  Referred by:  shelton PCP:  shelton  Chief Complaint:  3 month follow-up/ pt complaining of leg pain.  History of Present Illness: pt says her cbg's vary from 77-200.  it is generally highest in am, despite no hs-snack.  it is lowest at hs. pt states chronic leg pain at rest and with exertion    Current Allergies: No known allergies   Past Medical History:    Reviewed history from 08/13/2007 and no changes required:       FOOT PAIN, RIGHT (ICD-729.5)       WEIGHT GAIN (ICD-783.1)       INSOMNIA (ICD-780.52)       HYPERTENSION (ICD-401.9)       HYPERLIPIDEMIA (ICD-272.4)       DIABETES MELLITUS, TYPE I (ICD-250.01)            Review of Systems  The patient denies hypoglycemia.     Physical Exam  General:     obese.   Pulses:     dorsalis pedis intact bilat.   Extremities:     no deformity.  no ulcer on the feet.  feet are of normal color and temp.  no edema  Neurologic:     sensation is intact to touch on the feet  Additional Exam:      A1C%                 [H]  7.4 %      Impression & Recommendations:  Problem # 1:  DIABETES MELLITUS, TYPE I (ICD-250.01) with steady improvement  Problem # 2:  LEG PAIN, BILATERAL (ICD-729.5)  Medications Added to Medication List This Visit: 1)  Relion N 100 Unit/ml Susp (Insulin isophane human) .... Inject 12 unit subcutaneously at bedtime   Patient Instructions: 1)  cc dr Kirtland Bouchard shelton 2)  increase nph to 12 units qhs 3)  art dopplers 4)  decrease qac humalog to 06-23-11 units 5)  ret 3 mos   ]

## 2010-12-13 NOTE — Assessment & Plan Note (Signed)
Summary: 3 mth fu  $50   stc   Vital Signs:  Patient profile:   75 year old female Height:      65 inches Weight:      188 pounds BMI:     31.40 Temp:     97.6 degrees F oral Pulse rate:   84 / minute BP sitting:   126 / 62  (left arm) Cuff size:   large  Vitals Entered By: Bill Salinas CMA (Mar 18, 2009 1:18 PM) CC: follow-up visit   Referring Provider:  shelton Primary Provider:  shelton  CC:  follow-up visit.  History of Present Illness: pt says she feels well in general.  she brings a record of her cbg's which i have reviewed today.  it varies from high-60's (before lunch and after supper) to low-100's (other times).  denies any change in her weight.  Current Medications (verified): 1)  Humalog 100 Unit/ml  Soln (Insulin Lispro (Human)) .... Qac Three Times A Day) 05-22-12 Units 2)  Actos 15 Mg  Tabs (Pioglitazone Hcl) .... Take 1 By Mouth Qd 3)  Neurontin 100 Mg  Caps (Gabapentin) .... Take 1 By Mouth Three Times A Day Qd 4)  Pravastatin Sodium 20 Mg  Tabs (Pravastatin Sodium) .... Take 1 By Mouth Qd 5)  Metformin Hcl 1000 Mg  Tabs (Metformin Hcl) .... Take 1 By Mouth Q Am & 1/2 By Mouth Qhs 6)  Ambien 5 Mg  Tabs (Zolpidem Tartrate) .... Take 1 By Mouth At Bedtime Prn 7)  Metformin Hcl 500 Mg Tabs (Metformin Hcl) .... Take 1/2 Tablet By Mouth At Bedtime 8)  Relion N 100 Unit/ml Susp (Insulin Isophane Human) .... Inject 12 Unit Subcutaneously At Bedtime 9)  Ziac 2.5-6.25 Mg Tabs (Bisoprolol-Hydrochlorothiazide) .... Take 1 Tablet By Mouth Once A Day 10)  Metformin Hcl 500 Mg  Tb24 (Metformin Hcl) .... Take 2 By Mouth Q Am and 1 Pm Once Daily 11)  Bd U/f Short Pen Needle 31g X 8 Mm  Misc (Insulin Pen Needle) .... Use As Directed Three Times A Day 12)  Humalog Pen 100 Unit/ml Soln (Insulin Lispro (Human)) .... Use As Directed  Allergies (verified): No Known Drug Allergies  Past History:  Past Medical History:    FOOT PAIN, RIGHT (ICD-729.5)    WEIGHT GAIN (ICD-783.1)   INSOMNIA (ICD-780.52)    HYPERTENSION (ICD-401.9)    HYPERLIPIDEMIA (ICD-272.4)    DIABETES MELLITUS, TYPE I (ICD-250.01)     (06/15/2008)  Review of Systems  The patient denies syncope.    Physical Exam  General:  normal appearance.   Skin:  insulin injection sites at anterior abdomen are normal  Additional Exam:  a1c=7.5   Impression & Recommendations:  Problem # 1:  DIABETES MELLITUS, TYPE I (ICD-250.01) therapy limited by hypoglycemia  Medications Added to Medication List This Visit: 1)  Relion N 100 Unit/ml Susp (Insulin isophane human) .... Inject 9 unit subcutaneously at bedtime 2)  Humalog Pen 100 Unit/ml Soln (Insulin lispro (human)) .... Three times a day (qac0 04-22-10 units  Other Orders: TLB-A1C / Hgb A1C (Glycohemoglobin) (83036-A1C) Est. Patient Level III (29562)  Patient Instructions: 1)  same nph (9 units at bedtime) 2)  reduce humalog to (just before each meal) 04-22-10 3)  Please schedule a follow-up appointment in 3 months.

## 2010-12-13 NOTE — Assessment & Plan Note (Signed)
Summary: 3 MTH FU   STC   Vital Signs:  Patient profile:   75 year old female Height:      65 inches (165.10 cm) Weight:      174 pounds (79.09 kg) BMI:     29.06 O2 Sat:      96 % on Room air Temp:     97.0 degrees F (36.11 degrees C) oral Pulse rate:   78 / minute BP sitting:   118 / 72  (left arm) Cuff size:   large  Vitals Entered By: Josph Macho RMA (Mar 18, 2010 1:11 PM)  O2 Flow:  Room air CC: 3 month follow up/ pt states she is no longer taking Ambien/ pt states she needs refills on pen needles/ CF Is Patient Diabetic? Yes   Referring Provider:  shelton Primary Provider:  shelton  CC:  3 month follow up/ pt states she is no longer taking Ambien/ pt states she needs refills on pen needles/ CF.  History of Present Illness: pt states she feels well in general.  she brings a record of her cbg's which i have reviewed today.  it is variable, but has 1 cbg in the 90's at every time of day except the afternoon.  most cbg's are low to mid-100's.    Current Medications (verified): 1)  Neurontin 100 Mg  Caps (Gabapentin) .... Take 1 By Mouth Three Times A Day Qd 2)  Pravastatin Sodium 20 Mg  Tabs (Pravastatin Sodium) .... Take 1 By Mouth Qd 3)  Ambien 5 Mg  Tabs (Zolpidem Tartrate) .... Take 1 By Mouth At Bedtime Prn 4)  Metformin Hcl 500 Mg Tabs (Metformin Hcl) .... Take 1/2 Tablet By Mouth At Bedtime 5)  Relion N 100 Unit/ml Susp (Insulin Isophane Human) .... Inject 9 Unit Subcutaneously At Bedtime 6)  Ziac 2.5-6.25 Mg Tabs (Bisoprolol-Hydrochlorothiazide) .... Take 1 Tablet By Mouth Once A Day 7)  Metformin Hcl 500 Mg  Tb24 (Metformin Hcl) .... Take 2 By Mouth Q Am and 1 Pm Once Daily 8)  Bd U/f Short Pen Needle 31g X 8 Mm  Misc (Insulin Pen Needle) .... Use As Directed Three Times A Day 9)  Humalog Kwikpen 100 Unit/ml Soln (Insulin Lispro (Human)) .... Qac (Three Times A Day) 06-21-13 Units  Allergies (verified): No Known Drug Allergies  Past History:  Past Medical  History: Last updated: 06/15/2008 FOOT PAIN, RIGHT (ICD-729.5) WEIGHT GAIN (ICD-783.1) INSOMNIA (ICD-780.52) HYPERTENSION (ICD-401.9) HYPERLIPIDEMIA (ICD-272.4) DIABETES MELLITUS, TYPE I (ICD-250.01)  Review of Systems  The patient denies hypoglycemia.    Physical Exam  General:  normal appearance.   Pulses:  dorsalis pedis intact bilat.   Extremities:  no deformity.  no ulcer on the feet.  feet are of normal color and temp.  no edema  Neurologic:  sensation is intact to touch on the feet  Additional Exam:   Hemoglobin A1C       [H]  7.9 %    Impression & Recommendations:  Problem # 1:  DIABETES MELLITUS, TYPE I (ICD-250.01) Assessment Improved  Medications Added to Medication List This Visit: 1)  Bd U/f Short Pen Needle 31g X 8 Mm Misc (Insulin pen needle) .... Use as directed 4 x a day (any brand) 2)  Humalog Kwikpen 100 Unit/ml Soln (Insulin lispro (human)) .... Three times a day (just before each meal)  06-22-12 units, and pen needles 4x a day  Other Orders: TLB-A1C / Hgb A1C (Glycohemoglobin) (83036-A1C) Est. Patient Level III (84132)  Patient Instructions: 1)  tests are being ordered for you today.  a few days after the test(s), please call [phone] to hear your test results. 2)  pending the test results: 3)  continue nph 11 units at bedtime 4)  Please schedule a follow-up appointment in 3 months. 5)  increase humalog to (just before each meal) 07-23-12 units. 6)  (update: i left message on phone-tree:  rx as we discussed) Prescriptions: BD U/F SHORT PEN NEEDLE 31G X 8 MM  MISC (INSULIN PEN NEEDLE) use as directed 4 x a day (any brand)  #120 x 11   Entered and Authorized by:   Minus Breeding MD   Signed by:   Minus Breeding MD on 03/18/2010   Method used:   Electronically to        [facility]. [phone]* (retail)       [address]       [place], [place]  [address]       Ph: [phone]       Fax: [phone]   RxID:    [id number] METFORMIN HCL 500 MG  TB24 (METFORMIN HCL) take 2 by mouth q am and 1 pm once daily  #90 Tablet x 11   Entered and Authorized by:   Minus Breeding MD   Signed by:   Minus Breeding MD on 03/18/2010   Method used:   Electronically to        [facility]. [phone]* (retail)       [address]       [place], [place]  [address]       Ph: [phone]       Fax: [phone]   RxID:   [id number]

## 2010-12-13 NOTE — Assessment & Plan Note (Signed)
Summary: 3 MTH FU   $50    STC   Vital Signs:  Patient profile:   75 year old female Height:      65 inches Weight:      187 pounds BMI:     31.23 O2 Sat:      97 % on Room air Temp:     97.7 degrees F oral Pulse rate:   70 / minute BP sitting:   116 / 72  (left arm) Cuff size:   regular  Vitals Entered By: Ami Bullins CMA (June 17, 2009 1:00 PM)  O2 Flow:  Room air CC: pt here for 3 month fu and states her blood sugars have been running high in the PM, pt states the highest reading has been 208/ pt no longer taking actos or ambein/ ab   Referring Atlantis Delong:  shelton Primary Sally Reimers:  shelton  CC:  pt here for 3 month fu and states her blood sugars have been running high in the PM and pt states the highest reading has been 208/ pt no longer taking actos or ambein/ ab.  History of Present Illness: pt says her cbg's have increased sincve she has been off the actos.  she brings a record of her cbg's which i have reviewed today.  it vries from 110 (afternoon) to mid-100's (lunch and hs).  pt states she feels well in general.  Current Medications (verified): 1)  Humalog 100 Unit/ml  Soln (Insulin Lispro (Human)) .... Qac Three Times A Day) 05-22-12 Units 2)  Actos 15 Mg  Tabs (Pioglitazone Hcl) .... Take 1 By Mouth Qd 3)  Neurontin 100 Mg  Caps (Gabapentin) .... Take 1 By Mouth Three Times A Day Qd 4)  Pravastatin Sodium 20 Mg  Tabs (Pravastatin Sodium) .... Take 1 By Mouth Qd 5)  Metformin Hcl 1000 Mg  Tabs (Metformin Hcl) .... Take 1 By Mouth Q Am & 1/2 By Mouth Qhs 6)  Ambien 5 Mg  Tabs (Zolpidem Tartrate) .... Take 1 By Mouth At Bedtime Prn 7)  Metformin Hcl 500 Mg Tabs (Metformin Hcl) .... Take 1/2 Tablet By Mouth At Bedtime 8)  Relion N 100 Unit/ml Susp (Insulin Isophane Human) .... Inject 9 Unit Subcutaneously At Bedtime 9)  Ziac 2.5-6.25 Mg Tabs (Bisoprolol-Hydrochlorothiazide) .... Take 1 Tablet By Mouth Once A Day 10)  Metformin Hcl 500 Mg  Tb24 (Metformin Hcl) .... Take  2 By Mouth Q Am and 1 Pm Once Daily 11)  Bd U/f Short Pen Needle 31g X 8 Mm  Misc (Insulin Pen Needle) .... Use As Directed Three Times A Day 12)  Humalog Pen 100 Unit/ml Soln (Insulin Lispro (Human)) .... Three Times A Day (Qac0 04-22-10 Units  Allergies (verified): No Known Drug Allergies  Past History:  Past Medical History: Last updated: 06/15/2008 FOOT PAIN, RIGHT (ICD-729.5) WEIGHT GAIN (ICD-783.1) INSOMNIA (ICD-780.52) HYPERTENSION (ICD-401.9) HYPERLIPIDEMIA (ICD-272.4) DIABETES MELLITUS, TYPE I (ICD-250.01)  Review of Systems  The patient denies hypoglycemia.    Physical Exam  General:  normal appearance.   Neck:  Supple without thyroid enlargement or tenderness. No cervical lymphadenopathy, neck masses or tracheal deviation.  Additional Exam:  Hemoglobin A1C       [H]  8.0 %                       4.6-6.5 Microalbumin Ratio        1.8 mg/g     Impression & Recommendations:  Problem # 1:  DIABETES MELLITUS, TYPE I (ICD-250.01) needs increased rx  Medications Added to Medication List This Visit: 1)  Humalog Pen 100 Unit/ml Soln (Insulin lispro (human)) .... Three times a day (qac) 06-22-13 units  Other Orders: TLB-A1C / Hgb A1C (Glycohemoglobin) (83036-A1C) TLB-Microalbumin/Creat Ratio, Urine (82043-MALB) Est. Patient Level III ([address])  Patient Instructions: 1)  same nph (9 units at bedtime) 2)  continue humalog to (just before each meal) 04-22-10 3)  Please schedule a follow-up appointment in 3 months. 4)  tests are being ordered for you today.  a few days after the test(s), please call [phone] to hear your test results.  this is very important to do because the results may change the instructions you see here 5)  it is very important to keep good control of blood pressure and cholesterol, especially in those with diabetes.  please discuss these with your doctor.  you should take an aspirin every day, unless you have been advised by a doctor not to. 6)  we  discussed the importance of diet and exercise therapy and the risks of diabetes.  you should see an eye doctor every year. 7)  (update: i left message on phone-tree: 8)  increase humalog to (just before each meal) 06-22-13 units

## 2010-12-13 NOTE — Progress Notes (Signed)
Summary: bisoprolol-hctz  Phone Note From Pharmacy   Caller: [facility]. #[id number]* Reason for Call: Needs renewal Summary of Call: Requesting renewal on Bisoprolol-hctz 2.5/62.5mg  # 30 take 1 by mouth once daily. Last filled 05/17/08. Initial call taken by: Orlan Leavens,  June 17, 2008 1:43 PM      Prescriptions: ZIAC 2.5-6.25 MG TABS (BISOPROLOL-HYDROCHLOROTHIAZIDE) Take 1 tablet by mouth once a day  #30 Tablet x 6   Entered by:   Orlan Leavens   Authorized by:   Minus Breeding MD   Signed by:   Orlan Leavens on 06/17/2008   Method used:   Electronically sent to ...       [facility]. #[id number]*       [address]       [place], [place]  [address]       Ph: [phone]       Fax: [phone]   RxID:   [id number]

## 2010-12-15 NOTE — Medication Information (Signed)
Summary: Humalog/[employer]  Humalog/[employer]   Imported By: Sherian Rein 11/30/2010 14:25:40  _____________________________________________________________________  External Attachment:    Type:   Image     Comment:   External Document

## 2010-12-15 NOTE — Progress Notes (Signed)
Summary: PA-Humulin  Phone Note From Pharmacy   Summary of Call: PA-Humulin called [employer] @ [phone], awaiting form. Dagoberto Reef  December 08, 2010 4:14 PM Form to Dr Everardo All 12/08/10 . Dagoberto Reef  December 09, 2010 11:28 AM   Follow-up for Phone Call        [facility] called and needs to know if pt is on a insulin pump and what type of insulin she uses and they need a call back today. [phone] Tamela Oddi. Follow-up by: Verdell Face,  December 09, 2010 2:42 PM  Additional Follow-up for Phone Call Additional follow up Details #1::        informed Corky Mull of information needed, Prior Authorization approved per [place] at [facility] at number listed above. Additional Follow-up by: Brenton Grills CMA Duncan Dull),  December 09, 2010 3:35 PM

## 2010-12-15 NOTE — Progress Notes (Signed)
Summary: PA-Humalog  Phone Note From Pharmacy   Summary of Call: PA-Humalog, called [employer] @ [phone] received form gave form to Dr Everardo All to complete. Dagoberto Reef  November 18, 2010 8:44 AM   Follow-up for Phone Call        called pt to advise PA in process, sample can be  given until [employer] responds, no answer, no VM. Margaret Pyle, CMA  November 18, 2010 12:32 PM   Pt advised, sample in Side B fridge Follow-up by: Margaret Pyle, CMA,  November 18, 2010 1:22 PM  Additional Follow-up for Phone Call Additional follow up Details #1::        her ins does not pay for humalog.  i changed to reg insulin,a dn sent rx. Additional Follow-up by: Minus Breeding MD,  December 01, 2010 2:47 PM    New/Updated Medications: HUMULIN R 100 UNIT/ML SOLN (INSULIN REGULAR HUMAN) three times a day (just before each meal) 07-22-12 units Prescriptions: HUMULIN R 100 UNIT/ML SOLN (INSULIN REGULAR HUMAN) three times a day (just before each meal) 07-22-12 units  #1 vial x 11   Entered and Authorized by:   Minus Breeding MD   Signed by:   Minus Breeding MD on 12/01/2010   Method used:   Electronically to        [facility]. [phone]* (retail)       [address]       [place], [place]  [address]       Ph: [phone]       Fax: [phone]   RxID:   [id number]

## 2010-12-15 NOTE — Assessment & Plan Note (Signed)
Summary: 3 MTH FU---STC   Vital Signs:  Patient profile:   75 year old female Height:      65 inches (165.10 cm) Weight:      173.25 pounds (78.75 kg) BMI:     28.93 O2 Sat:      95 % on Room air Temp:     98.2 degrees F (36.78 degrees C) oral Pulse rate:   72 / minute BP sitting:   116 / 64  (left arm) Cuff size:   large  Vitals Entered By: Brenton Grills CMA (AAMA) (November 25, 2010 2:09 PM)  O2 Flow:  Room air CC: 3 month F/U/? about PA for insulin/aj Is Patient Diabetic? Yes   Referring Provider:  shelton Primary Provider:  shelton  CC:  3 month F/U/? about PA for insulin/aj.  History of Present Illness: pt states she feels well in general.  she brings a record of her cbg's which i have reviewed today.  it can be as low as 100 at any time of day, and most are in the low-100's.  she seldom has hypoglycemia, and these episodes are mild.    Current Medications (verified): 1)  Neurontin 100 Mg  Caps (Gabapentin) .... Take 1 By Mouth Three Times A Day Qd 2)  Pravastatin Sodium 20 Mg  Tabs (Pravastatin Sodium) .... Take 1 By Mouth Qd 3)  Relion N 100 Unit/ml Susp (Insulin Isophane Human) .... Inject 12 Units Subcutaneously At Bedtime 4)  Ziac 2.5-6.25 Mg Tabs (Bisoprolol-Hydrochlorothiazide) .... Take 1 Tablet By Mouth Once A Day 5)  Metformin Hcl 500 Mg  Tb24 (Metformin Hcl) .... Take 2 By Mouth Q Am and 1 Pm Once Daily 6)  Bd U/f Short Pen Needle 31g X 8 Mm  Misc (Insulin Pen Needle) .... Use As Directed 4 X A Day (Any Brand) 7)  Humalog Kwikpen 100 Unit/ml Soln (Insulin Lispro (Human)) .... Three Times A Day (Just Before Each Meal)  07-22-12 Units, and Pen Needles 4x A Day  Allergies (verified): No Known Drug Allergies  Past History:  Past Medical History: Last updated: 06/15/2008 FOOT PAIN, RIGHT (ICD-729.5) WEIGHT GAIN (ICD-783.1) INSOMNIA (ICD-780.52) HYPERTENSION (ICD-401.9) HYPERLIPIDEMIA (ICD-272.4) DIABETES MELLITUS, TYPE I (ICD-250.01)  Review of  Systems  The patient denies syncope.    Physical Exam  General:  normal appearance.   Skin:  gait is normal and steady Psych:  Alert and cooperative; normal mood and affect; normal attention span and concentration.   Additional Exam:  Hemoglobin A1C       [H]  8.4 %    Impression & Recommendations:  Problem # 1:  DIABETES MELLITUS, TYPE I (ICD-250.01) needs increased rx, but the pattern of her cbg's says we can't safely do so, expecially at her advanced age.  Other Orders: TLB-A1C / Hgb A1C (Glycohemoglobin) (83036-A1C) Est. Patient Level III (16109)  Patient Instructions: 1)  tests are being ordered for you today.  a few days after the test(s), please call [phone] to hear your test results. 2)  pending the test results, please continue the same insulin for now. 3)  Please schedule a follow-up appointment in 3 months. 4)  (update: i left message on phone-tree:  same rx). Prescriptions: HUMALOG KWIKPEN 100 UNIT/ML SOLN (INSULIN LISPRO (HUMAN)) three times a day (just before each meal)  07-22-12 units, and pen needles 4x a day  #1 box x 11   Entered and Authorized by:   Minus Breeding MD   Signed by:   Cleophas Dunker  Everardo All MD on 11/25/2010   Method used:   Print then Give to Patient   RxID:   [id number]    Orders Added: 1)  TLB-A1C / Hgb A1C (Glycohemoglobin) [83036-A1C] 2)  Est. Patient Level III [14782]

## 2010-12-29 NOTE — Medication Information (Signed)
Summary: Humulin/[facility]  Humulin/[facility]   Imported By: Sherian Rein 12/21/2010 14:26:53  _____________________________________________________________________  External Attachment:    Type:   Image     Comment:   External Document

## 2011-01-24 LAB — DIFFERENTIAL
Basophils Absolute: 0 10*3/uL (ref 0.0–0.1)
Basophils Relative: 0 % (ref 0–1)
Eosinophils Absolute: 0.1 10*3/uL (ref 0.0–0.7)
Eosinophils Relative: 3 % (ref 0–5)
Lymphocytes Relative: 54 % — ABNORMAL HIGH (ref 12–46)
Lymphs Abs: 2.5 10*3/uL (ref 0.7–4.0)
Monocytes Absolute: 0.3 10*3/uL (ref 0.1–1.0)
Monocytes Relative: 7 % (ref 3–12)
Neutro Abs: 1.6 10*3/uL — ABNORMAL LOW (ref 1.7–7.7)
Neutrophils Relative %: 35 % — ABNORMAL LOW (ref 43–77)

## 2011-01-24 LAB — URINALYSIS, ROUTINE W REFLEX MICROSCOPIC
Bilirubin Urine: NEGATIVE
Glucose, UA: NEGATIVE mg/dL
Hgb urine dipstick: NEGATIVE
Ketones, ur: NEGATIVE mg/dL
Nitrite: NEGATIVE
Protein, ur: NEGATIVE mg/dL
Specific Gravity, Urine: 1.006 (ref 1.005–1.030)
Urobilinogen, UA: 0.2 mg/dL (ref 0.0–1.0)
pH: 5.5 (ref 5.0–8.0)

## 2011-01-24 LAB — BASIC METABOLIC PANEL
BUN: 16 mg/dL (ref 6–23)
CO2: 27 mEq/L (ref 19–32)
Calcium: 9.8 mg/dL (ref 8.4–10.5)
Chloride: 98 mEq/L (ref 96–112)
Creatinine, Ser: 1.17 mg/dL (ref 0.4–1.2)
GFR calc Af Amer: 55 mL/min — ABNORMAL LOW (ref >60)
GFR calc non Af Amer: 45 mL/min — ABNORMAL LOW (ref >60)
Glucose, Bld: 124 mg/dL — ABNORMAL HIGH (ref 70–99)
Potassium: 4 mEq/L (ref 3.5–5.1)
Sodium: 136 mEq/L (ref 135–145)

## 2011-01-24 LAB — CBC
HCT: 38.2 % (ref 36.0–46.0)
Hemoglobin: 12.6 g/dL (ref 12.0–15.0)
MCH: 27 pg (ref 26.0–34.0)
MCHC: 33 g/dL (ref 30.0–36.0)
MCV: 81.8 fL (ref 78.0–100.0)
Platelets: 247 10*3/uL (ref 150–400)
RBC: 4.67 MIL/uL (ref 3.87–5.11)
RDW: 14.1 % (ref 11.5–15.5)
WBC: 4.6 10*3/uL (ref 4.0–10.5)

## 2011-01-24 LAB — URINE MICROSCOPIC-ADD ON

## 2011-01-24 LAB — POCT CARDIAC MARKERS
CKMB, poc: <1 ng/mL — ABNORMAL LOW (ref 1.0–8.0)
CKMB, poc: <1 ng/mL — ABNORMAL LOW (ref 1.0–8.0)
Myoglobin, poc: 54.5 ng/mL (ref 12–200)
Myoglobin, poc: 76.7 ng/mL (ref 12–200)
Troponin i, poc: <0.05 ng/mL (ref 0.00–0.09)
Troponin i, poc: <0.05 ng/mL (ref 0.00–0.09)

## 2011-03-03 ENCOUNTER — Other Ambulatory Visit (INDEPENDENT_AMBULATORY_CARE_PROVIDER_SITE_OTHER): Payer: Medicare Other

## 2011-03-03 ENCOUNTER — Ambulatory Visit (INDEPENDENT_AMBULATORY_CARE_PROVIDER_SITE_OTHER): Payer: Medicare Other | Admitting: Endocrinology

## 2011-03-03 ENCOUNTER — Encounter: Payer: Self-pay | Admitting: Endocrinology

## 2011-03-03 VITALS — BP 122/74 | HR 76 | Temp 98.6°F | Ht 65.0 in | Wt 175.4 lb

## 2011-03-03 DIAGNOSIS — E109 Type 1 diabetes mellitus without complications: Secondary | ICD-10-CM

## 2011-03-03 DIAGNOSIS — IMO0001 Reserved for inherently not codable concepts without codable children: Secondary | ICD-10-CM

## 2011-03-03 LAB — HEMOGLOBIN A1C: Hgb A1c MFr Bld: 9 % — ABNORMAL HIGH (ref 4.6–6.5)

## 2011-03-03 MED ORDER — GLUCOSE BLOOD VI STRP: ORAL_STRIP | Status: DC

## 2011-03-03 NOTE — Progress Notes (Signed)
  Subjective:    Patient ID: [name], female    DOB: 04-Jun-1936, 75 y.o.   MRN: [id number]  HPI pt states she feels well in general.  she brings a record of her cbg's which i have reviewed today.  It varies from 140-200, with  No trend throughout the day. Past Medical History  Diagnosis Date  . DIABETES MELLITUS, TYPE I 08/13/2007  . HYPERLIPIDEMIA 08/13/2007  . HYPERTENSION 08/13/2007  . INSOMNIA 08/13/2007   Past Surgical History  Procedure Date  . Stress cardiolite 02/19/2007    reports that she has never smoked. She does not have any smokeless tobacco history on file. Her alcohol and drug histories not on file. family history is not on file. Allergies not on file    Review of Systems denies hypoglycemia    Objective:   Physical Exam GENERAL: no distress Pulses: dorsalis pedis intact bilat.   Feet: no deformity.  no ulcer on the feet.  feet are of normal color and temp.  no edema Neuro: sensation is intact to touch on the feet     Lab Results  Component Value Date   HGBA1C 9.0* 03/03/2011      Assessment & Plan:  Dm, needs increased rx

## 2011-03-03 NOTE — Patient Instructions (Addendum)
Here is a new blood-sugar meter.  i have sent a prescription for strip to your pharmacy. check your blood sugar 2 times a day.  vary the time of day when you check, between before the 3 meals, and at bedtime.  also check if you have symptoms of your blood sugar being too high or too low.  please keep a record of the readings and bring it to your next appointment here.  please call us sooner if you are having low blood sugar episodes. blood tests are being ordered for you today.  please call [phone] to hear your test results. pending the test results, please increase regular insulin to 3x a day (just before each meal) 07-23-13 units. Please make a follow-up appointment in 3 months. good diet and exercise habits significanly improve the control of your diabetes.  please let me know if you wish to be referred to a dietician.  high blood sugar is very risky to your health.  you should see an eye doctor every year. controlling your blood pressure and cholesterol drastically reduces the damage diabetes does to your body.  this also applies to quitting smoking.  please discuss these with your doctor.  you should take an aspirin every day, unless you have been advised by a doctor not to. (update: i left message on phone-tree:  Increase reg insulin to 07-24-14 units).

## 2011-03-22 ENCOUNTER — Other Ambulatory Visit: Payer: Self-pay | Admitting: Endocrinology

## 2011-03-28 NOTE — Consult Note (Signed)
[facility]                          ENDOCRINOLOGY CONSULTATION   LIBBEY, DUCE                  MRN:          [id number]  DATE:05/07/2007                            DOB:          06/12/1936    REASON FOR VISIT:  Follow up diabetes.   HISTORY OF PRESENT ILLNESS:  A 75 year old woman who states her glucose  is well controlled.  She currently takes NPH insulin 4 units q.h.s. and  Humalog 7 breakfast, 8 lunch, and 10 supper.  She also takes Actos 15 mg  a day and metformin 1000 mg q.a.m. and 500 mg q.p.m.   PAST MEDICAL HISTORY:  1. Hypertension.  2. Dyslipidemia.   REVIEW OF SYSTEMS:  Denies hypoglycemia.   PHYSICAL EXAMINATION:  VITAL SIGNS:  Blood pressure is 116/66, heart  rate is 67, temperature is 97.3, the weight is 176.  GENERAL:  No distress.  FEET:  Normal color and temperature.  There is no ulcer present on the  feet.  Dorsalis pedis pulses are intact bilaterally and sensation is  intact to touch.   LABORATORY STUDIES:  On May 07, 2007, BMET is normal.  Urine  microalbumin ratio is normal.  Hemoglobin A1c elevated at 8.4.   IMPRESSION:  There is a difference between the patient's report about  how well controlled her glucose is and the A1c.  I will manage the  situation as best I can.   PLAN:  1. Increase NPH insulin to 6 units q.h.s.  2. Increase q.a.c. Humalog to 06/21/10.  3. Return 30 days with a record of your home glucoses.     Sean A. Everardo All, MD  Electronically Signed    SAE/MedQ  DD: 05/08/2007  DT: 05/09/2007  Job #: [id number]   cc:   Merlene Laughter. Renae Gloss, M.D.

## 2011-03-31 NOTE — Consult Note (Signed)
[facility]                          ENDOCRINOLOGY CONSULTATION   [name], [name]                  MRN:          [id number]  DATE:02/12/2007                            DOB:          Jun 30, 1936    REASON FOR VISIT:  Follow up diabetes.   HISTORY OF PRESENT ILLNESS:  A 75 year old woman who still has some  glucose in the high 100s at bedtime and then 200 in the morning, despite  not eating any nightly snack.   She also has some insomnia recently, for reasons she is uncertain about.   PAST MEDICAL HISTORY:  1. Hypertension.  2. Dyslipidemia.  3. She tells me today that her regular doctor was Dr. Renae Gloss, which I      was not aware of.   REVIEW OF SYSTEMS:  Denies hypoglycemia.   PHYSICAL EXAMINATION:  VITAL SIGNS:  Blood pressure 144/61, heart rate  73, temperature 98.6.  The weight is 179.  GENERAL:  In no distress.  She does not appear anxious nor depressed.   IMPRESSION:  1. She needs a further increase in her insulin.  2. Mild insomnia.   PLAN:  1. Increase q.a.c. Humalog to 05-20-09.  2. Add NPH Insulin 3 units nightly.  3. Return next month.  4. I prescribed her 15 tablets of Ambien 5 mg to take as needed and      advised her to follow up her insomnia with Dr. Renae Gloss.     Sean A. Everardo All, MD  Electronically Signed    SAE/MedQ  DD: 02/12/2007  DT: 02/13/2007  Job #: [id number]   cc:   Merlene Laughter. Renae Gloss, M.D.

## 2011-03-31 NOTE — Op Note (Signed)
NAMESHENAY, TORTI                     ACCOUNT NO.:  [id number]   MEDICAL RECORD NO.:  [id number]                   PATIENT TYPE:  AMB   LOCATION:  DAY                                  FACILITY:  [facility]   PHYSICIAN:  Lorre Munroe., M.D.            DATE OF BIRTH:  01-Aug-1936   DATE OF PROCEDURE:  03/02/2004  DATE OF DISCHARGE:                                 OPERATIVE REPORT   PREOPERATIVE DIAGNOSES:  Recurrent umbilical hernia.   POSTOPERATIVE DIAGNOSES:  Recurrent umbilical hernia.   OPERATION:  Repair of umbilical hernia.   SURGEON:  Lebron Conners, M.D.   ANESTHESIA:  General.   DESCRIPTION OF PROCEDURE:  After the patient was monitored and anesthetized  and had routine preparation and draping of the mid abdomen, I excised the  previous scar and lengthened it for a couple of centimeters on each side.  I  then cut into the hernia sac since it was very large and debrided portions  of it but only until I could reduce the contents of the hernia which  appeared to be omentum. I then grasped the edges of the fascia and made  incisions through the hernia sac and subcutaneous tissues and scar freeing  up the fascia and skin and several centimeters in each direction taking  great care not to button hole the umbilical skin. After adequate  mobilization, I closed the defect with running #0 Prolene suture.  There was  generalized weakness in the area so I further mobilized the skin and  subcutaneous tissues off the fascia in all directions and put on a patch of  polypropylene mesh which was trimmed to fit the defect.  The mesh measured  approximately 5 x 6 cm after I trimmed it.  I sewed it in with running  basting 2-0 Prolene suture and I felt the hernia repair was satisfactory.  I  then put in a 7 mm Jackson-Pratt drain brought out through a separate small  stab incision and cut to necessary length and secured it to the skin with 2-  0 Prolene. I sutured the umbilical skin  down to the mesh with a single  suture of 3-0 Vicryl and closed the subcutaneous tissues with running 3-0  Vicryl and then closed the skin with staples. Hemostasis was good.  Sponge,  needle and instrument counts were correct and the patient tolerated the  operation well.                                               Lorre Munroe., M.D.    Jodi Marble  D:  03/02/2004  T:  03/02/2004  Job:  [id number]

## 2011-03-31 NOTE — Op Note (Signed)
[name], [name]                     ACCOUNT NO.:  [id number]   MEDICAL RECORD NO.:  [id number]                   PATIENT TYPE:  INP   LOCATION:  0101                                 FACILITY:  [facility]   PHYSICIAN:  Lorre Munroe., M.D.            DATE OF BIRTH:  [birth year]   DATE OF PROCEDURE:  09/19/2003  DATE OF DISCHARGE:                                 OPERATIVE REPORT   PREOPERATIVE DIAGNOSES:  1. Acute appendicitis.  2. Umbilical hernia.   POSTOPERATIVE DIAGNOSES:  1. Acute appendicitis.  2. Umbilical hernia.   OPERATION:  Laparoscopic appendectomy and repair of umbilical hernia.   SURGEON:  Lebron Conners, M.D.   ANESTHESIA:  General.   DESCRIPTION OF PROCEDURE:  After the patient was monitored and anesthetized,  and had routine preparation and draping of the abdomen, I liberally infused  local anesthetic just below the umbilicus.  I made an incision down to the  fascia and opened the fascia just below the umbilical hernia.  I placed a 0  Vicryl suture in the fascia, after bluntly opening the peritoneum, and  secured a Hasson cannula.  I was easily able to see an inflamed appendix,  and no other abnormalities.  I then anesthetized sites, and under direct  vision put in a 5 mm right upper quadrant port and an 11 mm port in the left  lower quadrant.  I elevated the appendix and reduced its mesentery.  In so  doing, got a little bit of bleeding, which I controlled with clips.  I then  stapled across the appendix and mesentery.  Two firings of stapler were  required.  A little bit of hemostasis remained to be done.  I suctioned out  blood and assured good hemostasis.  I placed the appendix in a plastic pouch  and removed it through the umbilical incision; tied that pursestring suture.  I checked again for bleeding, and found that all had stopped.  I removed the  right upper quadrant port under direct vision and then allowed the CO2 to  escape, removed the  lower abdominal port.  Sponge, needle and instrument  counts were correct.   I made an incision through the subcutaneous tissues, elevating the umbilicus  off the umbilical hernia.  Defined its edges and closed it with running 0  Prolene suture down into the suture of 0 Vicryl (which had already been  placed).  I felt the repair was good.  I closed all skin incisions with  intracuticular 4-0 Vicryl and Steri-Strips.   She tolerated the operation well.                                               Lorre Munroe., M.D.    WB/MEDQ  D:  09/19/2003  T:  09/19/2003  Job:  [id number]  

## 2011-03-31 NOTE — Consult Note (Signed)
[facility]                          ENDOCRINOLOGY CONSULTATION   JAKYAH, BRADBY                  MRN:          [id number]  DATE:11/28/2006                            DOB:          1936-05-20    The patient is self-referred. Reason for referral: Diabetes.   HISTORY OF PRESENT ILLNESS:  [age]-year-old woman with a 20-year  history of diabetes. She is unaware of any chronic complications. She  has been on insulin for the past year. She states her glucose's are  extremely variable and has hypoglycemia 2-3 times per week, usually in  the afternoon and/or after exercise. She takes Humalog 4 breakfast, 3  lunch, and 3 supper.   Symptomatically, she has severe weight gain of 50 pounds over the past  few years but no associated numbness of the feet.   PAST MEDICAL HISTORY:  1. Hypertension.  2. Dyslipidemia.   SOCIAL HISTORY:  She is retired. She is here with her daughter. She was  widowed in 1999.   FAMILY HISTORY:  Positive for diabetes in both parents and in 6  siblings.   REVIEW OF SYSTEMS:  Denies chest pain and shortness of breath.   PHYSICAL EXAMINATION:  VITAL SIGNS: Blood pressure 136/70, her heart  rate is 79, temperature is 97.9. Weight is 184.  GENERAL:  In no distress.  SKIN: Normal texture and temperature, no rash.  HEENT: No proptosis, no periorbital swelling. Pharynx is normal.  NECK: Supple, no goiter.  CHEST: Clear to auscultation, no respiratory distress.  CARDIOVASCULAR: There is no JVD, trace bilateral pretibial edema.  Regular rate and rhythm, no murmur. Pedal pulses are intact and there is  no bruit at the carotid arteries.  FEET: Normal color and temperature, there is no ulcer present on the  feet.  NEUROLOGIC: Oriented, does not appear anxious nor depressed and  sensation is intact to touch on the feet.   LABORATORIES STUDIES:  On June 25, 2006 hemoglobin A1C was 7.9.   IMPRESSION:  1. Diabetes of  uncertain type. It appears that her control is being      limited by hypoglycemia.  2. Dyslipidemia.  3. Hypertension.  4. Weight gain.   PLAN:  1. We talked about the importance of diet and exercise therapy.  2. Decrease Glipizide to 10 mg daily.  3. Check glucose daily, varying the time of day among a.c. and h.s.  4. Return in 3 weeks.  5. Check A1C.  6. Continue other medications for now.     Sean A. Everardo All, MD  Electronically Signed    SAE/MedQ  DD: 11/30/2006  DT: 11/30/2006  Job #: [id number]

## 2011-03-31 NOTE — Discharge Summary (Signed)
NAMECIRIA, [name]                     ACCOUNT NO.:  [id number]   MEDICAL RECORD NO.:  [id number]                   PATIENT TYPE:  INP   LOCATION:  0452                                 FACILITY:  [facility]   PHYSICIAN:  Lorre Munroe., M.D.            DATE OF BIRTH:  03/30/1936   DATE OF ADMISSION:  09/19/2003  DATE OF DISCHARGE:  09/20/2003                                 DISCHARGE SUMMARY   HISTORY OF PRESENT ILLNESS:  the patient is a 75 year old woman with typical  pain in the lower abdomen and tenderness in the right lower quadrant  suggesting acute appendicitis.  CT scan was compatible with that diagnosis.  She was admitted to the hospital to have the procedure done.   On September 19, 2003, she underwent a laparoscopic appendectomy.  She also  had an umbilical hernia which I repaired.  She did well following the  surgery and was ready to go home the next day and was discharged on her  prehospitalization medications and given a prescription for pain medication  as well.   Final pathology showed acute appendicitis without any unsuspected  abnormalities.   DIAGNOSIS:  Acute appendicitis.   OPERATION:  Laparoscopic appendectomy.   CONDITION ON DISCHARGE:  Improved.                                               Lorre Munroe., M.D.    WB/MEDQ  D:  09/29/2003  T:  09/29/2003  Job:  [id number]

## 2011-03-31 NOTE — H&P (Signed)
   NAMEPEREL, HAUSCHILD                     ACCOUNT NO.:  [id number]   MEDICAL RECORD NO.:  [id number]                   PATIENT TYPE:  INP   LOCATION:  0101                                 FACILITY:  [facility]   PHYSICIAN:  Lorre Munroe., M.D.            DATE OF BIRTH:  08-Aug-1936   DATE OF ADMISSION:  09/19/2003  DATE OF DISCHARGE:                                HISTORY & PHYSICAL   CHIEF COMPLAINT:  Abdominal pain.   PRESENT ILLNESS:  A 75 year old black female without any chronic abdominal  or GI problems and no history of abdominal surgery who has had a one day  history of severe lower midline and right lower quadrant pain.  White count  was normal.  CT of the abdomen is consistent with acute appendicitis and  shows no other abnormalities.  She has no urinary symptoms.  She is advised  to undergo appendectomy and admitted for that purpose.   PAST MEDICAL HISTORY:  1. She has type 2 diabetes and is on oral agents for that.  2. She has hypertension which is well controlled by medicine.   CURRENT MEDICINES:  Actos, Glucovance, and Zestril.  She takes no other over  the counter medicines or any herbs or other medicines.   ALLERGIES:  She has no known medicine allergies.   She denies heart problems.  She does not smoke or drink.   FAMILY HISTORY:  Unremarkable.   CHILDHOOD ILLNESSES:  Unremarkable.   REVIEW OF SYSTEMS:  Unremarkable.  She specifically denies numbness in the  feet or hands, shortness of breath, chest pains.   PHYSICAL EXAMINATION:  GENERAL:  No acute distress.  Slightly sedated from  medication.  VITAL SIGNS:  Normal.  HEAD/NECK/EYES/EARS/NOSE/MOUTH AND THROAT:  Unremarkable.  CHEST:  Clear to auscultation.  HEART:  Rate and rhythm normal.  No murmur or gallop.  BREASTS:  Normal.  ABDOMEN:  No mass or organomegaly.  Quite tender across the lower abdomen  especially in the right lower quadrant.  RECTAL:  Not done.  PELVIC:  Not done.  EXTREMITIES:   Normal.  NEUROLOGIC:  Normal.    IMPRESSION:  1. Acute appendicitis.  2. Umbilical hernia.   PLAN:  Repair of umbilical hernia and laparoscopic appendectomy.  The  patient agrees to this.                                               Lorre Munroe., M.D.    WB/MEDQ  D:  09/19/2003  T:  09/19/2003  Job:  [id number]

## 2011-05-24 ENCOUNTER — Other Ambulatory Visit: Payer: Self-pay | Admitting: Endocrinology

## 2011-06-02 ENCOUNTER — Encounter: Payer: Self-pay | Admitting: Endocrinology

## 2011-06-02 ENCOUNTER — Other Ambulatory Visit (INDEPENDENT_AMBULATORY_CARE_PROVIDER_SITE_OTHER): Payer: Medicare Other

## 2011-06-02 ENCOUNTER — Ambulatory Visit (INDEPENDENT_AMBULATORY_CARE_PROVIDER_SITE_OTHER): Payer: Medicare Other | Admitting: Endocrinology

## 2011-06-02 VITALS — BP 122/68 | HR 69 | Temp 98.2°F | Ht 65.0 in | Wt 173.8 lb

## 2011-06-02 DIAGNOSIS — E109 Type 1 diabetes mellitus without complications: Secondary | ICD-10-CM

## 2011-06-02 LAB — HEMOGLOBIN A1C: Hgb A1c MFr Bld: 8.6 % — ABNORMAL HIGH (ref 4.6–6.5)

## 2011-06-02 MED ORDER — INSULIN LISPRO 100 UNIT/ML ~~LOC~~ SOLN: SUBCUTANEOUS | Status: DC

## 2011-06-02 MED ORDER — INSULIN NPH (HUMAN) (ISOPHANE) 100 UNIT/ML ~~LOC~~ SUSP: 13.0000 [IU] | Freq: Every day | SUBCUTANEOUS | Status: DC

## 2011-06-02 NOTE — Progress Notes (Signed)
  Subjective:    Patient ID: [name], female    DOB: [birth year], 75 y.o.   MRN: [id number]  HPI pt states she feels well in general.  She increased her insulin as rx'ed.  she brings a record of her cbg's, checked mostly in am and at hs, which i have reviewed today.  It varies from 84-200, but most are in the low-100's.  There is no trend throughout the day. Past Medical History  Diagnosis Date  . DIABETES MELLITUS, TYPE I 08/13/2007  . HYPERLIPIDEMIA 08/13/2007  . HYPERTENSION 08/13/2007  . INSOMNIA 08/13/2007    Past Surgical History  Procedure Date  . Stress cardiolite 02/19/2007    History   Social History  . Marital Status: Widowed    Spouse Name: N/A    Number of Children: N/A  . Years of Education: N/A   Occupational History  . Not on file.   Social History Main Topics  . Smoking status: Never Smoker   . Smokeless tobacco: Not on file  . Alcohol Use: Not on file  . Drug Use: Not on file  . Sexually Active: Not on file   Other Topics Concern  . Not on file   Social History Narrative  . No narrative on file    Current Outpatient Prescriptions on File Prior to Visit  Medication Sig Dispense Refill  . bisoprolol-hydrochlorothiazide (ZIAC) 2.5-6.25 MG per tablet take 1 tablet by mouth once daily  30 tablet  5  . gabapentin (NEURONTIN) 100 MG capsule Take 100 mg by mouth 3 (three) times daily.        Marland Kitchen glucose blood (ONE TOUCH ULTRA TEST) test strip 2x a day, and lancets 250.01  100 each  11  . Insulin Pen Needle (B-D ULTRAFINE III SHORT PEN) 31G X 8 MM MISC Use as directed 4 x a day       . metFORMIN (GLUCOPHAGE-XR) 500 MG 24 hr tablet Take 2 by mouth every morning and 1 every evening       . pravastatin (PRAVACHOL) 20 MG tablet take 1 tablet by mouth once daily for cholesterol  30 tablet  4    No Known Allergies  No family history on file.  BP 122/68  Pulse 69  Temp(Src) 98.2 F (36.8 C) (Oral)  Ht 5\' 5"  (1.651 m)  Wt 173 lb 12.8 oz (78.835 kg)   BMI 28.92 kg/m2  SpO2 96%    Review of Systems denies hypoglycemia    Objective:   Physical Exam GENERAL: no distress SKIN: Insulin injection sites at the anterior abdomen are normal    Lab Results  Component Value Date   HGBA1C 8.6* 06/02/2011     Assessment & Plan:  Dm, needs increased rx

## 2011-06-02 NOTE — Patient Instructions (Addendum)
check your blood sugar 2 times a day.  vary the time of day when you check, between before the 3 meals, and at bedtime.  also check if you have symptoms of your blood sugar being too high or too low.  please keep a record of the readings and bring it to your next appointment here.  please call us sooner if you are having low blood sugar episodes. blood tests are being ordered for you today.  please call [phone] to hear your test results.  You will be prompted to enter the 9-digit "MRN" number that appears at the top left of this page, followed by #.  Then you will hear the message. pending the test results, please continue the same insulin for now. Please make a follow-up appointment in 3 months. (update: i left message on phone-tree:  Increase nph to 14 units qhs.  Increase humalog to (just before each meal) 08-23-14 units)

## 2011-07-23 ENCOUNTER — Other Ambulatory Visit: Payer: Self-pay | Admitting: Endocrinology

## 2011-08-21 ENCOUNTER — Other Ambulatory Visit: Payer: Self-pay | Admitting: Endocrinology

## 2011-09-08 ENCOUNTER — Other Ambulatory Visit (INDEPENDENT_AMBULATORY_CARE_PROVIDER_SITE_OTHER): Payer: Medicare Other

## 2011-09-08 ENCOUNTER — Ambulatory Visit (INDEPENDENT_AMBULATORY_CARE_PROVIDER_SITE_OTHER): Payer: Medicare Other | Admitting: Endocrinology

## 2011-09-08 ENCOUNTER — Encounter: Payer: Self-pay | Admitting: Endocrinology

## 2011-09-08 VITALS — BP 122/72 | HR 72 | Temp 97.5°F | Ht 66.0 in | Wt 172.0 lb

## 2011-09-08 DIAGNOSIS — E109 Type 1 diabetes mellitus without complications: Secondary | ICD-10-CM

## 2011-09-08 DIAGNOSIS — Z23 Encounter for immunization: Secondary | ICD-10-CM

## 2011-09-08 LAB — HEMOGLOBIN A1C: Hgb A1c MFr Bld: 8.2 % — ABNORMAL HIGH (ref 4.6–6.5)

## 2011-09-08 NOTE — Progress Notes (Signed)
  Subjective:    Patient ID: [name], female    DOB: [birth year], 74 y.o.   MRN: [id number]  HPI pt states she feels well in general.  she brings a record of her cbg's which i have reviewed today.  It varies from 90-130.  There is no trend throughout the day. Past Medical History  Diagnosis Date  . DIABETES MELLITUS, TYPE I 08/13/2007  . HYPERLIPIDEMIA 08/13/2007  . HYPERTENSION 08/13/2007  . INSOMNIA 08/13/2007    Past Surgical History  Procedure Date  . Stress cardiolite 02/19/2007    History   Social History  . Marital Status: Widowed    Spouse Name: N/A    Number of Children: N/A  . Years of Education: N/A   Occupational History  . Not on file.   Social History Main Topics  . Smoking status: Never Smoker   . Smokeless tobacco: Not on file  . Alcohol Use: Not on file  . Drug Use: Not on file  . Sexually Active: Not on file   Other Topics Concern  . Not on file   Social History Narrative  . No narrative on file    Current Outpatient Prescriptions on File Prior to Visit  Medication Sig Dispense Refill  . bisoprolol-hydrochlorothiazide (ZIAC) 2.5-6.25 MG per tablet take 1 tablet by mouth once daily  30 tablet  5  . gabapentin (NEURONTIN) 100 MG capsule Take 100 mg by mouth 3 (three) times daily.        Marland Kitchen glucose blood (ONE TOUCH ULTRA TEST) test strip 2x a day, and lancets 250.01  100 each  11  . insulin lispro (HUMALOG) 100 UNIT/ML injection 3x a day (just before each meal) 08-23-14 units, and pen needles 4/day       . insulin NPH (HUMULIN N,NOVOLIN N) 100 UNIT/ML injection Inject 17 Units into the skin at bedtime.       . Insulin Pen Needle (B-D ULTRAFINE III SHORT PEN) 31G X 8 MM MISC Use as directed 4 x a day       . metFORMIN (GLUCOPHAGE-XR) 500 MG 24 hr tablet take 2 tablets by mouth every morning and 1 tablet every evening  90 tablet  5  . pravastatin (PRAVACHOL) 20 MG tablet take 1 tablet by mouth once daily for cholesterol  30 tablet  5    No  Known Allergies  No family history on file.  BP 122/72  Pulse 72  Temp(Src) 97.5 F (36.4 C) (Oral)  Ht 5\' 6"  (1.676 m)  Wt 172 lb (78.019 kg)  BMI 27.76 kg/m2  SpO2 97%  Review of Systems denies hypoglycemia    Objective:   Physical Exam Pulses: dorsalis pedis intact bilat.   Feet: no deformity.  no ulcer on the feet.  feet are of normal color and temp.  no edema Neuro: sensation is intact to touch on the feet.     Lab Results  Component Value Date   HGBA1C 8.2* 09/08/2011       Assessment & Plan:  Dm, needs increased rx

## 2011-09-08 NOTE — Patient Instructions (Addendum)
check your blood sugar 2 times a day.  vary the time of day when you check, between before the 3 meals, and at bedtime.  also check if you have symptoms of your blood sugar being too high or too low.  please keep a record of the readings and bring it to your next appointment here.  please call us sooner if you are having low blood sugar episodes. blood tests are being ordered for you today.  please call [phone] to hear your test results.  You will be prompted to enter the 9-digit "MRN" number that appears at the top left of this page, followed by #.  Then you will hear the message. pending the test results, please continue the same insulin for now. Please make a follow-up appointment in 4 months. (update: i left message on phone-tree:  Increase nph to 17 qhs).

## 2011-11-22 ENCOUNTER — Other Ambulatory Visit: Payer: Self-pay | Admitting: *Deleted

## 2011-11-22 MED ORDER — BISOPROLOL-HYDROCHLOROTHIAZIDE 2.5-6.25 MG PO TABS: ORAL_TABLET | ORAL | Status: DC

## 2011-11-22 NOTE — Telephone Encounter (Signed)
R'cd fax from [facility] for refill of Ziac

## 2012-01-12 ENCOUNTER — Ambulatory Visit: Payer: Medicare Other | Admitting: Endocrinology

## 2012-01-18 ENCOUNTER — Other Ambulatory Visit: Payer: Self-pay | Admitting: Endocrinology

## 2012-02-02 ENCOUNTER — Ambulatory Visit (INDEPENDENT_AMBULATORY_CARE_PROVIDER_SITE_OTHER): Payer: Medicare Other | Admitting: Endocrinology

## 2012-02-02 ENCOUNTER — Other Ambulatory Visit (INDEPENDENT_AMBULATORY_CARE_PROVIDER_SITE_OTHER): Payer: Medicare Other

## 2012-02-02 ENCOUNTER — Encounter: Payer: Self-pay | Admitting: Endocrinology

## 2012-02-02 VITALS — BP 122/70 | HR 68 | Temp 97.5°F | Ht 65.0 in | Wt 173.8 lb

## 2012-02-02 DIAGNOSIS — E109 Type 1 diabetes mellitus without complications: Secondary | ICD-10-CM

## 2012-02-02 LAB — HEMOGLOBIN A1C: Hgb A1c MFr Bld: 8.3 % — ABNORMAL HIGH (ref 4.6–6.5)

## 2012-02-02 MED ORDER — GLUCOSE BLOOD VI STRP: ORAL_STRIP | Status: DC

## 2012-02-02 NOTE — Patient Instructions (Addendum)
check your blood sugar 2 times a day.  vary the time of day when you check, between before the 3 meals, and at bedtime.  also check if you have symptoms of your blood sugar being too high or too low.  please keep a record of the readings and bring it to your next appointment here.  please call us sooner if you are having low blood sugar episodes. blood tests are being requested for you today.  You will receive a letter with results. Change humalog to 3x a day (just before each meal) 08-24-13 units Increase nph to 18 units at bedtime.   (see letter)

## 2012-02-02 NOTE — Progress Notes (Signed)
  Subjective:    Patient ID: [name], female    DOB: 1936-03-14, 76 y.o.   MRN: [id number]  HPI Pt returns for f/u of insulin-requiring DM (1980).  pt states she feels well in general.  she brings a record of her cbg's which i have reviewed today.  She has occasional mild hypoglycemia at hs.   Past Medical History  Diagnosis Date  . DIABETES MELLITUS, TYPE I 08/13/2007  . HYPERLIPIDEMIA 08/13/2007  . HYPERTENSION 08/13/2007  . INSOMNIA 08/13/2007    Past Surgical History  Procedure Date  . Stress cardiolite 02/19/2007    History   Social History  . Marital Status: Widowed    Spouse Name: N/A    Number of Children: N/A  . Years of Education: N/A   Occupational History  . Not on file.   Social History Main Topics  . Smoking status: Never Smoker   . Smokeless tobacco: Not on file  . Alcohol Use: Not on file  . Drug Use: Not on file  . Sexually Active: Not on file   Other Topics Concern  . Not on file   Social History Narrative  . No narrative on file    Current Outpatient Prescriptions on File Prior to Visit  Medication Sig Dispense Refill  . bisoprolol-hydrochlorothiazide (ZIAC) 2.5-6.25 MG per tablet take 1 tablet by mouth once daily  30 tablet  9  . gabapentin (NEURONTIN) 100 MG capsule Take 100 mg by mouth 3 (three) times daily.        . insulin lispro (HUMALOG) 100 UNIT/ML injection 3x a day (just before each meal) 08-24-12 units, and pen needles 4/day      . insulin NPH (HUMULIN N,NOVOLIN N) 100 UNIT/ML injection Inject 17 Units into the skin at bedtime.       . Insulin Pen Needle (B-D ULTRAFINE III SHORT PEN) 31G X 8 MM MISC Use as directed 4 x a day       . metFORMIN (GLUCOPHAGE-XR) 500 MG 24 hr tablet take 2 tablets by mouth every morning and 1 tablet every evening  90 tablet  5  . pravastatin (PRAVACHOL) 20 MG tablet take 1 tablet by mouth once daily for cholesterol  30 tablet  5    No Known Allergies  No family history on file.  BP 122/70   Pulse 68  Temp(Src) 97.5 F (36.4 C) (Oral)  Ht 5\' 5"  (1.651 m)  Wt 173 lb 12.8 oz (78.835 kg)  BMI 28.92 kg/m2  SpO2 97%    Review of Systems Denies loc    Objective:   Physical Exam VITAL SIGNS:  See vs page GENERAL: no distress Pulses: dorsalis pedis intact bilat.   Feet: no deformity.  no ulcer on the feet.  feet are of normal color and temp.  no edema Neuro: sensation is intact to touch on the feet   Lab Results  Component Value Date   HGBA1C 8.3* 02/02/2012      Assessment & Plan:  DM, needs increased rx

## 2012-02-05 ENCOUNTER — Telehealth: Payer: Self-pay | Admitting: *Deleted

## 2012-02-05 NOTE — Telephone Encounter (Signed)
Called to inform pt of A1c results, pt informed. (Letter also mailed to pt. )

## 2012-02-16 ENCOUNTER — Other Ambulatory Visit: Payer: Self-pay | Admitting: Endocrinology

## 2012-05-10 ENCOUNTER — Ambulatory Visit: Payer: Medicare Other | Admitting: Endocrinology

## 2012-05-13 ENCOUNTER — Other Ambulatory Visit: Payer: Self-pay | Admitting: Endocrinology

## 2012-05-17 ENCOUNTER — Other Ambulatory Visit (INDEPENDENT_AMBULATORY_CARE_PROVIDER_SITE_OTHER): Payer: Medicare Other

## 2012-05-17 ENCOUNTER — Ambulatory Visit (INDEPENDENT_AMBULATORY_CARE_PROVIDER_SITE_OTHER): Payer: Medicare Other | Admitting: Endocrinology

## 2012-05-17 VITALS — BP 120/70 | HR 70 | Temp 97.4°F | Resp 18 | Wt 174.0 lb

## 2012-05-17 DIAGNOSIS — E109 Type 1 diabetes mellitus without complications: Secondary | ICD-10-CM

## 2012-05-17 LAB — MICROALBUMIN / CREATININE URINE RATIO
Creatinine,U: 105.7 mg/dL
Microalb Creat Ratio: 0.6 mg/g (ref 0.0–30.0)
Microalb, Ur: 0.6 mg/dL (ref 0.0–1.9)

## 2012-05-17 LAB — HEMOGLOBIN A1C: Hgb A1c MFr Bld: 8.4 % — ABNORMAL HIGH (ref 4.6–6.5)

## 2012-05-17 NOTE — Progress Notes (Signed)
  Subjective:    Patient ID: [name], female    DOB: 01-14-1936, 76 y.o.   MRN: [id number]  HPI Pt returns for f/u of insulin-requiring DM (dx'ed 1980).  pt states she feels well in general.  she brings a record of her cbg's which i have reviewed today.  She still has occasional mild hypoglycemia at hs (cbg was 70).  It is in general highest before lunch Past Medical History  Diagnosis Date  . DIABETES MELLITUS, TYPE I 08/13/2007  . HYPERLIPIDEMIA 08/13/2007  . HYPERTENSION 08/13/2007  . INSOMNIA 08/13/2007    Past Surgical History  Procedure Date  . Stress cardiolite 02/19/2007    History   Social History  . Marital Status: Widowed    Spouse Name: N/A    Number of Children: N/A  . Years of Education: N/A   Occupational History  . Not on file.   Social History Main Topics  . Smoking status: Never Smoker   . Smokeless tobacco: Not on file  . Alcohol Use: Not on file  . Drug Use: Not on file  . Sexually Active: Not on file   Other Topics Concern  . Not on file   Social History Narrative  . No narrative on file    Current Outpatient Prescriptions on File Prior to Visit  Medication Sig Dispense Refill  . B-D ULTRAFINE III SHORT PEN 31G X 8 MM MISC use three times a day to four times a day as directed by prescriber  100 each  6  . bisoprolol-hydrochlorothiazide (ZIAC) 2.5-6.25 MG per tablet take 1 tablet by mouth once daily  30 tablet  9  . gabapentin (NEURONTIN) 100 MG capsule Take 100 mg by mouth 3 (three) times daily.        Marland Kitchen glucose blood (ONE TOUCH ULTRA TEST) test strip 2x a day, and lancets 250.01  100 each  11  . insulin lispro (HUMALOG) 100 UNIT/ML injection 3x a day (just before each meal) 09-24-13 units, and pen needles 4/day      . insulin NPH (HUMULIN N,NOVOLIN N) 100 UNIT/ML injection Inject 18 Units into the skin at bedtime.       . metFORMIN (GLUCOPHAGE-XR) 500 MG 24 hr tablet take 2 tablets by mouth every morning and 1 tablet every evening  90  tablet  5  . pravastatin (PRAVACHOL) 20 MG tablet take 1 tablet by mouth once daily for cholesterol  30 tablet  5  . DISCONTD: insulin NPH (HUMULIN N PEN) 100 UNIT/ML injection Inject 13 Units into the skin at bedtime.  15 mL  12    No Known Allergies  No family history on file.  BP 120/70  Pulse 70  Temp 97.4 F (36.3 C) (Oral)  Resp 18  Wt 174 lb 0.6 oz (78.944 kg)  SpO2 97%  Review of Systems denies hypoglycemia    Objective:   Physical Exam VITAL SIGNS:  See vs page GENERAL: no distress SKIN:  Insulin injection sites at the anterior abdomen are normal   Lab Results  Component Value Date   HGBA1C 8.4* 05/17/2012      Assessment & Plan:  DM.  needs increased rx

## 2012-05-17 NOTE — Patient Instructions (Addendum)
check your blood sugar 2 times a day.  vary the time of day when you check, between before the 3 meals, and at bedtime.  also check if you have symptoms of your blood sugar being too high or too low.  please keep a record of the readings and bring it to your next appointment here.  please call us sooner if you are having low blood sugar episodes. blood tests are being requested for you today.  You will receive a letter with results. increase humalog to 3x a day (just before each meal) 09-24-13 units continue nph at 18 units at bedtime.   Please come back for a follow-up appointment in 3 months.

## 2012-05-20 ENCOUNTER — Telehealth: Payer: Self-pay | Admitting: *Deleted

## 2012-05-20 NOTE — Telephone Encounter (Signed)
Called pt to inform of lab results, pt informed via VM and to callback office with any questions/concerns (letter also mailed to pt).  

## 2012-06-01 ENCOUNTER — Other Ambulatory Visit: Payer: Self-pay | Admitting: Endocrinology

## 2012-07-12 ENCOUNTER — Other Ambulatory Visit: Payer: Self-pay | Admitting: Endocrinology

## 2012-07-22 ENCOUNTER — Other Ambulatory Visit: Payer: Self-pay | Admitting: Endocrinology

## 2012-08-12 ENCOUNTER — Other Ambulatory Visit: Payer: Self-pay | Admitting: Endocrinology

## 2012-08-16 ENCOUNTER — Encounter: Payer: Self-pay | Admitting: Endocrinology

## 2012-08-16 ENCOUNTER — Ambulatory Visit (INDEPENDENT_AMBULATORY_CARE_PROVIDER_SITE_OTHER): Payer: Medicare Other | Admitting: Endocrinology

## 2012-08-16 VITALS — BP 124/80 | HR 69 | Temp 97.6°F | Wt 174.0 lb

## 2012-08-16 DIAGNOSIS — E109 Type 1 diabetes mellitus without complications: Secondary | ICD-10-CM

## 2012-08-16 LAB — HEMOGLOBIN A1C
Hgb A1c MFr Bld: 7.9 % — ABNORMAL HIGH
Mean Plasma Glucose: 180 mg/dL — ABNORMAL HIGH

## 2012-08-16 NOTE — Patient Instructions (Addendum)
check your blood sugar 2 times a day.  vary the time of day when you check, between before the 3 meals, and at bedtime.  also check if you have symptoms of your blood sugar being too high or too low.  please keep a record of the readings and bring it to your next appointment here.  please call us sooner if you are having low blood sugar episodes. blood tests are being requested for you today.  You will be contacted with results.   Please come back for a follow-up appointment in 3 months.  

## 2012-08-16 NOTE — Progress Notes (Signed)
  Subjective:    Patient ID: [name], female    DOB: 03-14-1936, 76 y.o.   MRN: [id number]  HPI Pt returns for f/u of insulin-requiring DM (dx'ed 1980; no known complications).  pt states she feels well in general.  she brings a record of her cbg's which i have reviewed today.  It varies from 80's (afternoon) to mid-100's (other times of day).   Past Medical History  Diagnosis Date  . DIABETES MELLITUS, TYPE I 08/13/2007  . HYPERLIPIDEMIA 08/13/2007  . HYPERTENSION 08/13/2007  . INSOMNIA 08/13/2007    Past Surgical History  Procedure Date  . Stress cardiolite 02/19/2007    History   Social History  . Marital Status: Widowed    Spouse Name: N/A    Number of Children: N/A  . Years of Education: N/A   Occupational History  . Not on file.   Social History Main Topics  . Smoking status: Never Smoker   . Smokeless tobacco: Not on file  . Alcohol Use: Not on file  . Drug Use: Not on file  . Sexually Active: Not on file   Other Topics Concern  . Not on file   Social History Narrative  . No narrative on file    Current Outpatient Prescriptions on File Prior to Visit  Medication Sig Dispense Refill  . B-D ULTRAFINE III SHORT PEN 31G X 8 MM MISC use three times a day to four times a day as directed by prescriber  100 each  6  . bisoprolol-hydrochlorothiazide (ZIAC) 2.5-6.25 MG per tablet take 1 tablet by mouth once daily  30 tablet  9  . gabapentin (NEURONTIN) 100 MG capsule Take 100 mg by mouth 3 (three) times daily.        Marland Kitchen glucose blood (ONE TOUCH ULTRA TEST) test strip 2x a day, and lancets 250.01  100 each  11  . insulin lispro (HUMALOG KWIKPEN) 100 UNIT/ML injection Inject subcutaneously three times a day (just before each meal) 09-24-13 units  15 mL  5  . insulin NPH (HUMULIN N PEN) 100 UNIT/ML injection Inject 18 Units into the skin at bedtime.  15 mL  3  . metFORMIN (GLUCOPHAGE-XR) 500 MG 24 hr tablet take 2 tablets by mouth every morning and 1 tablet every  evening  90 tablet  5  . pravastatin (PRAVACHOL) 20 MG tablet take 1 tablet by mouth once daily for cholesterol  30 tablet  5    No Known Allergies  No family history on file.  BP 124/80  Pulse 69  Temp 97.6 F (36.4 C) (Oral)  Wt 174 lb (78.926 kg)  Review of Systems denies hypoglycemia    Objective:   Physical Exam Pulses: dorsalis pedis intact bilat.   Feet: no deformity.  no ulcer on the feet.  feet are of normal color and temp.  no edema Neuro: sensation is intact to touch on the feet.       Assessment & Plan:  DM, apparently well-controlled

## 2012-08-27 ENCOUNTER — Ambulatory Visit (INDEPENDENT_AMBULATORY_CARE_PROVIDER_SITE_OTHER): Payer: Medicare Other | Admitting: Endocrinology

## 2012-08-27 DIAGNOSIS — Z23 Encounter for immunization: Secondary | ICD-10-CM

## 2012-08-27 NOTE — Patient Instructions (Signed)
check your blood sugar 2 times a day.  vary the time of day when you check, between before the 3 meals, and at bedtime.  also check if you have symptoms of your blood sugar being too high or too low.  please keep a record of the readings and bring it to your next appointment here.  please call us sooner if you are having low blood sugar episodes. blood tests are being requested for you today.  You will be contacted with results.   Please come back for a follow-up appointment in 3 months.

## 2012-08-27 NOTE — Progress Notes (Signed)
  Subjective:    Patient ID: [name], female    DOB: 11-01-1936, 76 y.o.   MRN: [id number]  HPI Pt returns for f/u of insulin-requiring DM (dx'ed 1980; no known complications).  pt states she feels well in general.  she brings a record of her cbg's which i have reviewed today.  It varies from 80's (afternoon) to mid-100's (other times of day).     Review of Systems     Objective:   Physical Exam        Assessment & Plan:

## 2012-09-09 ENCOUNTER — Other Ambulatory Visit: Payer: Self-pay | Admitting: Endocrinology

## 2012-11-08 ENCOUNTER — Other Ambulatory Visit: Payer: Self-pay | Admitting: Endocrinology

## 2012-11-22 ENCOUNTER — Ambulatory Visit (INDEPENDENT_AMBULATORY_CARE_PROVIDER_SITE_OTHER): Payer: Medicare Other | Admitting: Endocrinology

## 2012-11-22 ENCOUNTER — Encounter: Payer: Self-pay | Admitting: Endocrinology

## 2012-11-22 VITALS — BP 122/76 | HR 75 | Wt 177.0 lb

## 2012-11-22 DIAGNOSIS — E109 Type 1 diabetes mellitus without complications: Secondary | ICD-10-CM

## 2012-11-22 LAB — HEMOGLOBIN A1C
Hgb A1c MFr Bld: 8.1 % — ABNORMAL HIGH (ref <5.7)
Mean Plasma Glucose: 186 mg/dL — ABNORMAL HIGH (ref <117)

## 2012-11-22 NOTE — Patient Instructions (Addendum)
check your blood sugar 2 times a day.  vary the time of day when you check, between before the 3 meals, and at bedtime.  also check if you have symptoms of your blood sugar being too high or too low.  please keep a record of the readings and bring it to your next appointment here.  please call us sooner if you are having low blood sugar episodes. blood tests are being requested for you today.  You will be contacted with results.   Please come back for a follow-up appointment in 3 months.  You can stop the metformin, to simplify your medications Please increase the humalog to three times a day (just before each meal) 10-24-17 units

## 2012-11-22 NOTE — Progress Notes (Signed)
  Subjective:    Patient ID: [name], female    DOB: 1936-04-03, 77 y.o.   MRN: [id number]  HPI Pt returns for f/u of insulin-requiring DM (dx'ed 1980; no known complications).  pt states she feels well in general.  she brings a record of her cbg's which i have reviewed today.  It varies from 100-200.  It is in general highest at hs. Past Medical History  Diagnosis Date  . DIABETES MELLITUS, TYPE I 08/13/2007  . HYPERLIPIDEMIA 08/13/2007  . HYPERTENSION 08/13/2007  . INSOMNIA 08/13/2007    Past Surgical History  Procedure Date  . Stress cardiolite 02/19/2007    History   Social History  . Marital Status: Widowed    Spouse Name: N/A    Number of Children: N/A  . Years of Education: N/A   Occupational History  . Not on file.   Social History Main Topics  . Smoking status: Never Smoker   . Smokeless tobacco: Not on file  . Alcohol Use: Not on file  . Drug Use: Not on file  . Sexually Active: Not on file   Other Topics Concern  . Not on file   Social History Narrative  . No narrative on file    Current Outpatient Prescriptions on File Prior to Visit  Medication Sig Dispense Refill  . B-D ULTRAFINE III SHORT PEN 31G X 8 MM MISC use three times a day to four times a day as directed by prescriber  100 each  6  . bisoprolol-hydrochlorothiazide (ZIAC) 2.5-6.25 MG per tablet take 1 tablet by mouth once daily  30 tablet  9  . gabapentin (NEURONTIN) 100 MG capsule Take 100 mg by mouth 3 (three) times daily.        Marland Kitchen glucose blood (ONE TOUCH ULTRA TEST) test strip 2x a day, and lancets 250.01  100 each  11  . insulin lispro (HUMALOG) 100 UNIT/ML injection Inject subcutaneously three times a day (just before each meal) 10-24-17 units      . insulin NPH (HUMULIN N PEN) 100 UNIT/ML injection Inject 18 Units into the skin at bedtime.  15 mL  3  . pravastatin (PRAVACHOL) 20 MG tablet take 1 tablet by mouth once daily for cholesterol  30 tablet  5   No Known Allergies  No  family history on file.  BP 122/76  Pulse 75  Wt 177 lb (80.287 kg)  SpO2 96%  Review of Systems denies hypoglycemia.    Objective:   Physical Exam VITAL SIGNS:  See vs page GENERAL: no distress SKIN:  Insulin injection sites at the anterior abdomen are normal   Lab Results  Component Value Date   HGBA1C 8.1* 11/22/2012      Assessment & Plan:  DM, needs increased rx

## 2012-12-12 ENCOUNTER — Other Ambulatory Visit: Payer: Self-pay | Admitting: Endocrinology

## 2012-12-30 ENCOUNTER — Other Ambulatory Visit: Payer: Self-pay | Admitting: Endocrinology

## 2013-01-27 ENCOUNTER — Other Ambulatory Visit: Payer: Self-pay | Admitting: *Deleted

## 2013-01-27 ENCOUNTER — Other Ambulatory Visit: Payer: Self-pay | Admitting: Endocrinology

## 2013-01-27 MED ORDER — PRAVASTATIN SODIUM 20 MG PO TABS: 20.0000 mg | ORAL_TABLET | Freq: Every day | ORAL | Status: DC

## 2013-02-21 ENCOUNTER — Ambulatory Visit: Payer: Medicare Other | Admitting: Endocrinology

## 2013-02-21 DIAGNOSIS — Z0289 Encounter for other administrative examinations: Secondary | ICD-10-CM

## 2013-03-24 ENCOUNTER — Other Ambulatory Visit: Payer: Self-pay | Admitting: Endocrinology

## 2013-06-06 ENCOUNTER — Other Ambulatory Visit: Payer: Self-pay | Admitting: Endocrinology

## 2013-06-14 ENCOUNTER — Other Ambulatory Visit: Payer: Self-pay | Admitting: Endocrinology

## 2013-06-16 ENCOUNTER — Other Ambulatory Visit: Payer: Self-pay | Admitting: *Deleted

## 2013-06-16 MED ORDER — BISOPROLOL-HYDROCHLOROTHIAZIDE 2.5-6.25 MG PO TABS: 1.0000 | ORAL_TABLET | Freq: Every day | ORAL | Status: DC

## 2013-06-18 ENCOUNTER — Other Ambulatory Visit: Payer: Self-pay

## 2013-06-27 ENCOUNTER — Other Ambulatory Visit: Payer: Self-pay | Admitting: *Deleted

## 2013-06-27 ENCOUNTER — Other Ambulatory Visit: Payer: Self-pay | Admitting: Endocrinology

## 2013-06-27 MED ORDER — INSULIN LISPRO 100 UNIT/ML (KWIKPEN): PEN_INJECTOR | SUBCUTANEOUS | Status: DC

## 2013-07-09 ENCOUNTER — Other Ambulatory Visit: Payer: Self-pay

## 2013-07-09 MED ORDER — PRAVASTATIN SODIUM 20 MG PO TABS: 20.0000 mg | ORAL_TABLET | Freq: Every day | ORAL | Status: DC

## 2013-09-09 ENCOUNTER — Telehealth: Payer: Self-pay

## 2013-09-09 NOTE — Telephone Encounter (Signed)
Rx request for bisoprolol-hctz 2.5-6.25mg .  Rx last filled 06/16/13 #30x 2 rf noted pt needed a follow up appt for next refll.  Pt last seen 11/22/12. No appts scheduled.  Pls advise.

## 2013-09-09 NOTE — Telephone Encounter (Signed)
i only see pt for DM.  Please direct request to PCP.

## 2013-09-16 ENCOUNTER — Encounter: Payer: Self-pay | Admitting: Internal Medicine

## 2013-09-16 ENCOUNTER — Ambulatory Visit (INDEPENDENT_AMBULATORY_CARE_PROVIDER_SITE_OTHER): Payer: Medicare Other | Admitting: Internal Medicine

## 2013-09-16 ENCOUNTER — Other Ambulatory Visit: Payer: Self-pay

## 2013-09-16 VITALS — BP 139/64 | HR 72 | Ht 64.0 in | Wt 172.8 lb

## 2013-09-16 DIAGNOSIS — Z23 Encounter for immunization: Secondary | ICD-10-CM

## 2013-09-16 DIAGNOSIS — E119 Type 2 diabetes mellitus without complications: Secondary | ICD-10-CM

## 2013-09-16 DIAGNOSIS — E109 Type 1 diabetes mellitus without complications: Secondary | ICD-10-CM

## 2013-09-16 DIAGNOSIS — R0789 Other chest pain: Secondary | ICD-10-CM

## 2013-09-16 DIAGNOSIS — E785 Hyperlipidemia, unspecified: Secondary | ICD-10-CM

## 2013-09-16 DIAGNOSIS — I10 Essential (primary) hypertension: Secondary | ICD-10-CM

## 2013-09-16 DIAGNOSIS — K219 Gastro-esophageal reflux disease without esophagitis: Secondary | ICD-10-CM

## 2013-09-16 MED ORDER — BISOPROLOL-HYDROCHLOROTHIAZIDE 2.5-6.25 MG PO TABS: 1.0000 | ORAL_TABLET | Freq: Every day | ORAL | Status: DC

## 2013-09-16 NOTE — Patient Instructions (Addendum)
Your physician has requested that you have en exercise stress myoview. For further information please visit [email or web address] Please follow instruction sheet, as given.  Please schedule a follow up visit after your test.

## 2013-09-16 NOTE — Progress Notes (Signed)
OFFICE NOTE  Chief Complaint:  Chest pressure  Primary Care Physician: Alva Garnet., MD  HPI:  [name] is a pleasant 77 year old female referred to me by Dr. Renae Gloss. She is a past medical history significant for diabetes on insulin, peripheral neuropathy secondary to diabetes, dyslipidemia and hypertension. Over the past several weeks she's noted some chest pressure which she says starts in the upper abdominal region and is more substernal. She had one episode that occurred when she was walking on a treadmill but improved somewhat after she stopped and rested. She has had several other episodes not associated with exercise however sometimes the symptoms are associated with a pressure feeling that feels like gas. She has had some relief with belching, but that is not consistent. She does report to eat somewhat about high fat diet, was fried foods, but not specifically spicy foods. She has been taking Zantac as needed and he does seem to improve his symptoms somewhat. An EKG was performed at Dr. Mathews Robinsons office which was abnormal and I repeated that today again demonstrated nonspecific T wave changes but otherwise sinus rhythm. There is a family history of heart disease in her father who died of heart attack and kidney failure and her mother had kidney failure presumably due to hypertension and diabetes.  PMHx:  Past Medical History  Diagnosis Date  . DIABETES MELLITUS, TYPE I 08/13/2007  . HYPERLIPIDEMIA 08/13/2007  . HYPERTENSION 08/13/2007  . INSOMNIA 08/13/2007    Past Surgical History  Procedure Laterality Date  . Stress cardiolite  02/19/2007    FAMHx:  Family History  Problem Relation Age of Onset  . Kidney disease Brother   . Diabetes Brother   . Colon cancer Brother   . Diabetes Mother   . Diabetes Father   . Heart Problems Father   . Kidney disease Sister   . Diabetes Sister   . Multiple sclerosis Child   . Hypertension Child     x2  . Diabetes  Child     x2    SOCHx:   reports that she has never smoked. She has never used smokeless tobacco. She reports that she does not drink alcohol or use illicit drugs.  ALLERGIES:  No Known Allergies  ROS: A comprehensive review of systems was negative except for: Constitutional: positive for weight gain Cardiovascular: positive for chest pressure/discomfort Gastrointestinal: positive for reflux symptoms  HOME MEDS: Current Outpatient Prescriptions  Medication Sig Dispense Refill  . aspirin 325 MG EC tablet Take 325 mg by mouth daily.      . B-D ULTRAFINE III SHORT PEN 31G X 8 MM MISC use 3 to 4 TIMES A DAY as directed by prescriber  100 each  6  . bisoprolol-hydrochlorothiazide (ZIAC) 2.5-6.25 MG per tablet Take 1 tablet by mouth daily.  30 tablet  2  . Calcium Carb-Cholecalciferol (CALCIUM 600 + D) 600-200 MG-UNIT TABS Take 2 tablets by mouth daily.      . Cholecalciferol (VITAMIN D-3) 1000 UNITS CAPS Take 1 capsule by mouth daily.      . clorazepate (TRANXENE) 7.5 MG tablet Take 7.5 mg by mouth 2 (two) times daily as needed for anxiety.      . gabapentin (NEURONTIN) 100 MG capsule Take 100 mg by mouth 2 (two) times daily.       . Insulin Isophane Human (HUMULIN N PEN) 100 UNIT/ML SUPN 17 units at bedtime      . insulin lispro (HUMALOG KWIKPEN) 100 UNIT/ML SOPN INJECT  SUBCUTANEOUSLY 3 TIMES A DAY (JUST BEFORE EACH MEAL) 10-24-17 UNITS.  15 mL  0  . Magnesium 250 MG TABS Take 1 tablet by mouth daily.      . metFORMIN (GLUCOPHAGE-XR) 500 MG 24 hr tablet take 2 tablets by mouth twice daily      . Multiple Vitamin (MULTIVITAMIN) capsule Take 1 capsule by mouth daily.      . ONE TOUCH ULTRA TEST test strip TEST twice a day - DIAGNOSIS CODE 250.01  100 each  11  . pravastatin (PRAVACHOL) 20 MG tablet Take 1 tablet (20 mg total) by mouth daily.  30 tablet  5  . vitamin C (ASCORBIC ACID) 500 MG tablet Take 500 mg by mouth daily.       No current facility-administered medications for this  visit.    LABS/IMAGING: No results found for this or any previous visit (from the past 48 hour(s)). No results found.  VITALS: BP 139/64  Pulse 72  Ht 5\' 4"  (1.626 m)  Wt 172 lb 12.8 oz (78.382 kg)  BMI 29.65 kg/m2  EXAM: General appearance: alert and no distress Neck: no carotid bruit and no JVD Lungs: clear to auscultation bilaterally Heart: regular rate and rhythm, S1, S2 normal, no murmur, click, rub or gallop Abdomen: soft, non-tender; bowel sounds normal; no masses,  no organomegaly and no rebound or guarding Extremities: extremities normal, atraumatic, no cyanosis or edema Pulses: 2+ and symmetric Skin: Skin color, texture, turgor normal. No rashes or lesions Neurologic: Grossly normal Psych: Mood, affect normal  EKG: Normal sinus rhythm at 72, nonspecific T wave changes  ASSESSMENT: 1. Chest pressure, with and without exertion 2. Insulin-dependent diabetes with neuropathy 3. Hypertension-controlled 4. Dyslipidemia 5. Family history of coronary disease 6. Probable GERD  PLAN: 1.   [name] has had several episodes of chest pressure that occurred with or without exertion. She does have multiple coronary risk factors and never has had an evaluation of her heart. She is interested in doing more exercise on the treadmill and I think a treadmill nuclear stress test is indicated. Hopefully this is negative and her symptoms may be more attributable to reflux. It does not she is benefiting from the addition of Zantac and may need a proton pump inhibitor if her symptoms occur more frequently. I recommended an exercise nuclear stress test, I will see her back in the office to discuss results in a few weeks.  Thanks again for the kind referral.  Chrystie Nose, MD, [facility] Attending Cardiologist [facility]  Andrew Soria C 09/16/2013, 10:32 AM

## 2013-09-18 ENCOUNTER — Encounter: Payer: Self-pay | Admitting: Internal Medicine

## 2013-09-24 ENCOUNTER — Ambulatory Visit (HOSPITAL_COMMUNITY)
Admission: RE | Admit: 2013-09-24 | Discharge: 2013-09-24 | Disposition: A | Discharge status: Home / Self Care | Payer: Medicare Other | Source: Ambulatory Visit | Attending: Cardiovascular Disease | Admitting: Cardiovascular Disease

## 2013-09-24 DIAGNOSIS — I10 Essential (primary) hypertension: Secondary | ICD-10-CM

## 2013-09-24 DIAGNOSIS — E119 Type 2 diabetes mellitus without complications: Secondary | ICD-10-CM

## 2013-09-24 DIAGNOSIS — E785 Hyperlipidemia, unspecified: Secondary | ICD-10-CM | POA: Insufficient documentation

## 2013-09-24 DIAGNOSIS — R0789 Other chest pain: Secondary | ICD-10-CM | POA: Insufficient documentation

## 2013-09-24 MED ORDER — AMINOPHYLLINE 25 MG/ML IV SOLN
75.0000 mg | Freq: Once | INTRAVENOUS | Status: AC
  Administered 2013-09-24: 75 mg via INTRAVENOUS

## 2013-09-24 MED ORDER — REGADENOSON 0.4 MG/5ML IV SOLN
0.4000 mg | Freq: Once | INTRAVENOUS | Status: AC
  Administered 2013-09-24: 0.4 mg via INTRAVENOUS

## 2013-09-24 MED ORDER — TECHNETIUM TC 99M SESTAMIBI GENERIC - CARDIOLITE
10.0000 | Freq: Once | INTRAVENOUS | Status: AC | PRN
  Administered 2013-09-24: 10 via INTRAVENOUS

## 2013-09-24 MED ORDER — TECHNETIUM TC 99M SESTAMIBI GENERIC - CARDIOLITE
30.0000 | Freq: Once | INTRAVENOUS | Status: AC | PRN
  Administered 2013-09-24: 30 via INTRAVENOUS

## 2013-09-24 NOTE — Procedures (Addendum)
[facility] [address] [place] [address] [phone]  Cardiology Nuclear Med Study  [name] is a 77 y.o. female     MRN : [id number]     DOB: [birth year]  Procedure Date: 09/24/2013  Nuclear Med Background Indication for Stress Test:  Evaluation for Ischemia and Abnormal EKG History:  No prior cardiac or respiratory history reported by patient. Cardiac Risk Factors: Family History - CAD, Hypertension, IDDM Type 1, Lipids and Overweight  Symptoms:  Chest Pain, Dizziness, DOE, Fatigue, Light-Headedness and SOB   Nuclear Pre-Procedure Caffeine/Decaff Intake:  7:00pm NPO After: 5:00am   IV Site: R Forearm  IV 0.9% NS with Angio Cath:  22g  Chest Size (in):  n/a IV Started by: Emmit Pomfret, RN  Height: 5\' 4"  (1.626 m)  Cup Size: C  BMI:  Body mass index is 29.51 kg/(m^2). Weight:  172 lb (78.019 kg)   Tech Comments:  Test was changed to [employer]. Could not achieve target heart rate. Patient requested to stop treadmilll due to fatigue and shortness of breath.    Nuclear Med Study 1 or 2 day study: 1 day  Stress Test Type:  Lexiscan  Order Authorizing Provider:  Zoila Shutter, MD   Resting Radionuclide: Technetium 50m Sestamibi  Resting Radionuclide Dose: 10.4 mCi   Stress Radionuclide:  Technetium 45m Sestamibi  Stress Radionuclide Dose: 30.8 mCi           Stress Protocol Rest HR:81 Stress HR: 97  Rest BP: 169/83 Stress BP: 169/83  Exercise Time (min): n/a METS: n/a          Dose of Adenosine (mg):  n/a Dose of Lexiscan: 0.4 mg  Dose of Atropine (mg): n/a Dose of Dobutamine: n/a mcg/kg/min (at max HR)  Stress Test Technologist: Ernestene Mention, CCT Nuclear Technologist: Koren Shiver, CNMT   Rest Procedure:  Myocardial perfusion imaging was performed at rest 45 minutes following the intravenous administration of Technetium 39m Sestamibi. Stress Procedure:  The patient received IV Lexiscan 0.4 mg  over 15-seconds.  Technetium 92m Sestamibi injected at 30-seconds.  Due to patient's shortness of breath, she was given IV Aminophylline 75 mg. Symptom was resolved during recovery. There were no significant changes with Lexiscan.  Quantitative spect images were obtained after a 45 minute delay.  Transient Ischemic Dilatation (Normal <1.22):  0.87 Lung/Heart Ratio (Normal <0.45):  0.33 QGS EDV:  36 ml QGS ESV:  5 ml LV Ejection Fraction: 87%  Rest ECG: NSR - Normal EKG  Stress ECG: No significant change from baseline ECG  QPS Raw Data Images:  There is interference from nuclear activity from structures below the diaphragm. This does not affect the ability to read the study. Stress Images:  Normal homogeneous uptake in all areas of the myocardium. Rest Images:  Normal homogeneous uptake in all areas of the myocardium. Subtraction (SDS):  No evidence of ischemia.  Impression Exercise Capacity:  Lexiscan with no exercise. BP Response:  Normal blood pressure response. Clinical Symptoms:  No significant symptoms noted. ECG Impression:  No significant ST segment change suggestive of ischemia. Comparison with Prior Nuclear Study: No previous nuclear study performed  Overall Impression:  Normal stress nuclear study.  LV Wall Motion:  NL LV Function; NL Wall Motion; EF 87%  Chrystie Nose, MD, [facility] Board Certified in Nuclear Cardiology Attending Cardiologist [facility]  Chrystie Nose, MD  09/24/2013 1:35 PM

## 2013-09-30 ENCOUNTER — Encounter: Payer: Self-pay | Admitting: Internal Medicine

## 2013-09-30 ENCOUNTER — Ambulatory Visit (INDEPENDENT_AMBULATORY_CARE_PROVIDER_SITE_OTHER): Payer: Medicare Other | Admitting: Internal Medicine

## 2013-09-30 VITALS — BP 122/72 | HR 84 | Ht 64.0 in | Wt 170.4 lb

## 2013-09-30 DIAGNOSIS — R635 Abnormal weight gain: Secondary | ICD-10-CM

## 2013-09-30 DIAGNOSIS — E1049 Type 1 diabetes mellitus with other diabetic neurological complication: Secondary | ICD-10-CM

## 2013-09-30 DIAGNOSIS — E1040 Type 1 diabetes mellitus with diabetic neuropathy, unspecified: Secondary | ICD-10-CM

## 2013-09-30 DIAGNOSIS — E785 Hyperlipidemia, unspecified: Secondary | ICD-10-CM

## 2013-09-30 DIAGNOSIS — E1142 Type 2 diabetes mellitus with diabetic polyneuropathy: Secondary | ICD-10-CM

## 2013-09-30 DIAGNOSIS — I10 Essential (primary) hypertension: Secondary | ICD-10-CM

## 2013-09-30 DIAGNOSIS — R0789 Other chest pain: Secondary | ICD-10-CM

## 2013-09-30 NOTE — Progress Notes (Signed)
OFFICE NOTE  Chief Complaint:  Chest pressure  Primary Care Physician: Alva Garnet., MD  HPI:  [name] is a pleasant 77 year old female referred to me by Dr. Renae Gloss. She is a past medical history significant for diabetes on insulin, peripheral neuropathy secondary to diabetes, dyslipidemia and hypertension. Over the past several weeks she's noted some chest pressure which she says starts in the upper abdominal region and is more substernal. She had one episode that occurred when she was walking on a treadmill but improved somewhat after she stopped and rested. She has had several other episodes not associated with exercise however sometimes the symptoms are associated with a pressure feeling that feels like gas. She has had some relief with belching, but that is not consistent. She does report to eat somewhat about high fat diet, was fried foods, but not specifically spicy foods. She has been taking Zantac as needed and he does seem to improve his symptoms somewhat. An EKG was performed at Dr. Mathews Robinsons office which was abnormal and I repeated that today again demonstrated nonspecific T wave changes but otherwise sinus rhythm. There is a family history of heart disease in her father who died of heart attack and kidney failure and her mother had kidney failure presumably due to hypertension and diabetes.  At her last office visit I recommended a nuclear stress test which she underwent on 09/24/2013. This was negative for ischemia with an EF of 87%. I reviewed results with her today and I feel that her symptoms are more likely related to reflux. She continues to have some belching.  PMHx:  Past Medical History  Diagnosis Date  . DIABETES MELLITUS, TYPE I 08/13/2007  . HYPERLIPIDEMIA 08/13/2007  . HYPERTENSION 08/13/2007  . INSOMNIA 08/13/2007    Past Surgical History  Procedure Laterality Date  . Stress cardiolite  02/19/2007    FAMHx:  Family History  Problem  Relation Age of Onset  . Kidney disease Brother   . Diabetes Brother   . Colon cancer Brother   . Diabetes Mother   . Diabetes Father   . Heart Problems Father   . Kidney disease Sister   . Diabetes Sister   . Multiple sclerosis Child   . Hypertension Child     x2  . Diabetes Child     x2    SOCHx:   reports that she has never smoked. She has never used smokeless tobacco. She reports that she does not drink alcohol or use illicit drugs.  ALLERGIES:  No Known Allergies  ROS: A comprehensive review of systems was negative except for: Constitutional: positive for weight gain Cardiovascular: positive for chest pressure/discomfort Gastrointestinal: positive for reflux symptoms  HOME MEDS: Current Outpatient Prescriptions  Medication Sig Dispense Refill  . aspirin 325 MG EC tablet Take 325 mg by mouth daily.      Marland Kitchen azithromycin (ZITHROMAX) 250 MG tablet Take 250 mg by mouth daily.       . B-D ULTRAFINE III SHORT PEN 31G X 8 MM MISC use 3 to 4 TIMES A DAY as directed by prescriber  100 each  6  . bisoprolol-hydrochlorothiazide (ZIAC) 2.5-6.25 MG per tablet Take 1 tablet by mouth daily.  30 tablet  2  . Calcium Carb-Cholecalciferol (CALCIUM 600 + D) 600-200 MG-UNIT TABS Take 2 tablets by mouth daily.      . Cholecalciferol (VITAMIN D-3) 1000 UNITS CAPS Take 1 capsule by mouth daily.      . clorazepate (TRANXENE)  7.5 MG tablet Take 7.5 mg by mouth 2 (two) times daily as needed for anxiety.      . gabapentin (NEURONTIN) 100 MG capsule Take 100 mg by mouth 2 (two) times daily.       . Insulin Isophane Human (HUMULIN N PEN) 100 UNIT/ML SUPN 17 units at bedtime      . insulin lispro (HUMALOG KWIKPEN) 100 UNIT/ML SOPN INJECT SUBCUTANEOUSLY 3 TIMES A DAY (JUST BEFORE EACH MEAL) 10-24-17 UNITS.  15 mL  0  . Magnesium 250 MG TABS Take 1 tablet by mouth daily.      . metFORMIN (GLUCOPHAGE-XR) 500 MG 24 hr tablet take 2 tablets by mouth twice daily      . Multiple Vitamin (MULTIVITAMIN)  capsule Take 1 capsule by mouth daily.      . ONE TOUCH ULTRA TEST test strip TEST twice a day - DIAGNOSIS CODE 250.01  100 each  11  . pravastatin (PRAVACHOL) 20 MG tablet Take 1 tablet (20 mg total) by mouth daily.  30 tablet  5  . vitamin C (ASCORBIC ACID) 500 MG tablet Take 500 mg by mouth daily.       No current facility-administered medications for this visit.    LABS/IMAGING: No results found for this or any previous visit (from the past 48 hour(s)). No results found.  VITALS: BP 122/72  Pulse 84  Ht 5\' 4"  (1.626 m)  Wt 170 lb 6.4 oz (77.293 kg)  BMI 29.23 kg/m2  EXAM: deferred  EKG: deferred  ASSESSMENT: 1. Chest pressure, with and without exertion - negative nuclear stress test 11/14 2. Insulin-dependent diabetes with neuropathy 3. Hypertension-controlled 4. Dyslipidemia 5. Family history of coronary disease 6. Probable GERD  PLAN: 1.   Ms. Principato had a negative nuclear stress test for ischemia. I suspect her symptoms are more related to GERD and have recommended over-the-counter omeprazole or esomeprazole to a treating her symptoms.  Given her cardiac risk factors I would like to followup with her annually for ongoing risk factor modification.  Thanks again for the kind referral.  Chrystie Nose, MD, [facility] Attending Cardiologist [facility]  Kaelan Emami C 09/30/2013, 1:36 PM

## 2013-09-30 NOTE — Patient Instructions (Signed)
Dr. Rennis Golden recommends OTC omeprazole for reflux symptoms.  Your physician wants you to follow-up in: 1 year. You will receive a reminder letter in the mail two months in advance. If you don't receive a letter, please call our office to schedule the follow-up appointment.

## 2013-12-05 ENCOUNTER — Other Ambulatory Visit: Payer: Self-pay

## 2013-12-05 MED ORDER — BISOPROLOL-HYDROCHLOROTHIAZIDE 2.5-6.25 MG PO TABS: 1.0000 | ORAL_TABLET | Freq: Every day | ORAL | Status: AC

## 2013-12-31 ENCOUNTER — Other Ambulatory Visit: Payer: Self-pay

## 2013-12-31 MED ORDER — PRAVASTATIN SODIUM 20 MG PO TABS: 20.0000 mg | ORAL_TABLET | Freq: Every day | ORAL | Status: DC

## 2014-02-09 ENCOUNTER — Other Ambulatory Visit: Payer: Self-pay

## 2014-04-15 ENCOUNTER — Telehealth: Payer: Self-pay | Admitting: Internal Medicine

## 2014-04-15 NOTE — Telephone Encounter (Signed)
Patient needs clearance.  Last nuclear stress test in Nov 2014 (normal)  Will defer to Dr. Debara Pickett to advise.

## 2014-04-15 NOTE — Telephone Encounter (Signed)
Pt just found out she needs a hysterectomy.She needs clarence for this surgery. Please fax to Dr Marcello Moores Henley-[phone].

## 2014-04-16 ENCOUNTER — Encounter: Payer: Self-pay | Admitting: *Deleted

## 2014-04-16 NOTE — Telephone Encounter (Signed)
Patient's daughter, Luellen Pucker, was notified.

## 2014-04-16 NOTE — Telephone Encounter (Signed)
Letter composed and faxed through epic to Dr. Ulanda Edison. Will print letter and fax to number provided.

## 2014-04-16 NOTE — Telephone Encounter (Signed)
Low risk for hysterectomy.  Ok to provide clearance letter.  Dr. Debara Pickett

## 2014-05-24 ENCOUNTER — Other Ambulatory Visit: Payer: Self-pay | Admitting: Obstetrics and Gynecology

## 2014-05-26 NOTE — Patient Instructions (Addendum)
   Your procedure is scheduled on:  Wednesday, July 22  Enter through the [employer] of [facility] at:  6 AM Pick up the phone at the desk and dial [phone] and inform us of your arrival.  Please call this number if you have any problems the morning of surgery: [phone]  Remember: Do not eat or drink after midnight: Tuesday Take these medicines the morning of surgery with a SIP OF WATER:  Bisoprolol-hctz, gabapentin, omprazole, pravastatin.   Patient instructed to take Metformin Tuesday morning but withhold Tuesday night and Wednesday morning dose.  Patient to half Humulin bedtime dose - take 9 units.  Do not take any diabetes medications on day of surgery - Wednesday.  We will check your blood sugar upon arrival in Short Stay Department.  Do not wear jewelry, make-up, or FINGER nail polish No metal in your hair or on your body. Do not wear lotions, powders, perfumes.  You may wear deodorant.  Do not bring valuables to the hospital. Contacts, dentures or bridgework may not be worn into surgery.  Leave suitcase in the car. After Surgery it may be brought to your room. For patients being admitted to the hospital, checkout time is 11:00am the day of discharge.  Home with daughter Luellen Pucker cell [phone].

## 2014-05-27 ENCOUNTER — Encounter (HOSPITAL_COMMUNITY): Payer: Self-pay | Admitting: Pharmacy Technician

## 2014-05-28 ENCOUNTER — Encounter (HOSPITAL_COMMUNITY): Payer: Self-pay

## 2014-05-28 ENCOUNTER — Encounter (HOSPITAL_COMMUNITY)
Admission: RE | Admit: 2014-05-28 | Discharge: 2014-05-28 | Disposition: A | Discharge status: Home / Self Care | Payer: Medicare Other | Source: Ambulatory Visit | Attending: Obstetrics and Gynecology | Admitting: Obstetrics and Gynecology

## 2014-05-28 DIAGNOSIS — Z01812 Encounter for preprocedural laboratory examination: Secondary | ICD-10-CM | POA: Insufficient documentation

## 2014-05-28 HISTORY — DX: Gastro-esophageal reflux disease without esophagitis: K21.9

## 2014-05-28 HISTORY — DX: Type 2 diabetes mellitus with ketoacidosis without coma: E11.10

## 2014-05-28 LAB — URINALYSIS, ROUTINE W REFLEX MICROSCOPIC
Bilirubin Urine: NEGATIVE
Glucose, UA: NEGATIVE mg/dL
Hgb urine dipstick: NEGATIVE
Ketones, ur: NEGATIVE mg/dL
Nitrite: NEGATIVE
Protein, ur: NEGATIVE mg/dL
Specific Gravity, Urine: 1.02 (ref 1.005–1.030)
Urobilinogen, UA: 0.2 mg/dL (ref 0.0–1.0)
pH: 5.5 (ref 5.0–8.0)

## 2014-05-28 LAB — CBC WITH DIFFERENTIAL/PLATELET
Basophils Absolute: 0 10*3/uL (ref 0.0–0.1)
Basophils Relative: 0 % (ref 0–1)
Eosinophils Absolute: 0.1 10*3/uL (ref 0.0–0.7)
Eosinophils Relative: 2 % (ref 0–5)
HCT: 41.8 % (ref 36.0–46.0)
Hemoglobin: 13.7 g/dL (ref 12.0–15.0)
Lymphocytes Relative: 44 % (ref 12–46)
Lymphs Abs: 2.5 10*3/uL (ref 0.7–4.0)
MCH: 27.4 pg (ref 26.0–34.0)
MCHC: 32.8 g/dL (ref 30.0–36.0)
MCV: 83.6 fL (ref 78.0–100.0)
Monocytes Absolute: 0.4 10*3/uL (ref 0.1–1.0)
Monocytes Relative: 7 % (ref 3–12)
Neutro Abs: 2.7 10*3/uL (ref 1.7–7.7)
Neutrophils Relative %: 47 % (ref 43–77)
Platelets: 321 10*3/uL (ref 150–400)
RBC: 5 MIL/uL (ref 3.87–5.11)
RDW: 14.7 % (ref 11.5–15.5)
WBC: 5.7 10*3/uL (ref 4.0–10.5)

## 2014-05-28 LAB — COMPREHENSIVE METABOLIC PANEL
ALT: 60 U/L — ABNORMAL HIGH (ref 0–35)
AST: 45 U/L — ABNORMAL HIGH (ref 0–37)
Albumin: 4.2 g/dL (ref 3.5–5.2)
Alkaline Phosphatase: 63 U/L (ref 39–117)
Anion gap: 14 (ref 5–15)
BUN: 15 mg/dL (ref 6–23)
CO2: 29 mEq/L (ref 19–32)
Calcium: 10.3 mg/dL (ref 8.4–10.5)
Chloride: 96 mEq/L (ref 96–112)
Creatinine, Ser: 1.1 mg/dL (ref 0.50–1.10)
GFR calc Af Amer: 55 mL/min — ABNORMAL LOW (ref >90)
GFR calc non Af Amer: 48 mL/min — ABNORMAL LOW (ref >90)
Glucose, Bld: 274 mg/dL — ABNORMAL HIGH (ref 70–99)
Potassium: 4.4 mEq/L (ref 3.7–5.3)
Sodium: 139 mEq/L (ref 137–147)
Total Bilirubin: 0.3 mg/dL (ref 0.3–1.2)
Total Protein: 8 g/dL (ref 6.0–8.3)

## 2014-05-28 LAB — URINE MICROSCOPIC-ADD ON

## 2014-05-28 NOTE — Pre-Procedure Instructions (Signed)
Dr Royce Macadamia informed that patient's cbg is 274 at PAT visit today.  Patient has not taken her diabetes medication this morning but has had breakfast.  No orders given.  [place] for surgery.

## 2014-06-03 ENCOUNTER — Encounter (HOSPITAL_COMMUNITY): Payer: Self-pay | Admitting: *Deleted

## 2014-06-03 ENCOUNTER — Ambulatory Visit (HOSPITAL_COMMUNITY): Payer: Medicare Other | Admitting: Anesthesiology

## 2014-06-03 ENCOUNTER — Encounter (HOSPITAL_COMMUNITY)
Admission: RE | Disposition: A | Discharge status: Home / Self Care | Payer: Self-pay | Source: Ambulatory Visit | Attending: Obstetrics and Gynecology

## 2014-06-03 ENCOUNTER — Ambulatory Visit (HOSPITAL_COMMUNITY)
Admission: RE | Admit: 2014-06-03 | Discharge: 2014-06-05 | Disposition: A | Discharge status: Home / Self Care | Payer: Medicare Other | Source: Ambulatory Visit | Attending: Obstetrics and Gynecology | Admitting: Obstetrics and Gynecology

## 2014-06-03 ENCOUNTER — Encounter (HOSPITAL_COMMUNITY): Payer: Medicare Other | Admitting: Anesthesiology

## 2014-06-03 DIAGNOSIS — Z800 Family history of malignant neoplasm of digestive organs: Secondary | ICD-10-CM | POA: Diagnosis not present

## 2014-06-03 DIAGNOSIS — I10 Essential (primary) hypertension: Secondary | ICD-10-CM | POA: Diagnosis not present

## 2014-06-03 DIAGNOSIS — E1040 Type 1 diabetes mellitus with diabetic neuropathy, unspecified: Secondary | ICD-10-CM

## 2014-06-03 DIAGNOSIS — E785 Hyperlipidemia, unspecified: Secondary | ICD-10-CM

## 2014-06-03 DIAGNOSIS — N8110 Cystocele, unspecified: Secondary | ICD-10-CM | POA: Diagnosis present

## 2014-06-03 DIAGNOSIS — Z794 Long term (current) use of insulin: Secondary | ICD-10-CM | POA: Insufficient documentation

## 2014-06-03 DIAGNOSIS — D252 Subserosal leiomyoma of uterus: Secondary | ICD-10-CM | POA: Insufficient documentation

## 2014-06-03 DIAGNOSIS — N888 Other specified noninflammatory disorders of cervix uteri: Secondary | ICD-10-CM | POA: Insufficient documentation

## 2014-06-03 DIAGNOSIS — E7800 Pure hypercholesterolemia, unspecified: Secondary | ICD-10-CM | POA: Diagnosis not present

## 2014-06-03 DIAGNOSIS — R159 Full incontinence of feces: Secondary | ICD-10-CM | POA: Insufficient documentation

## 2014-06-03 DIAGNOSIS — D251 Intramural leiomyoma of uterus: Secondary | ICD-10-CM | POA: Insufficient documentation

## 2014-06-03 DIAGNOSIS — N812 Incomplete uterovaginal prolapse: Secondary | ICD-10-CM | POA: Diagnosis not present

## 2014-06-03 DIAGNOSIS — K219 Gastro-esophageal reflux disease without esophagitis: Secondary | ICD-10-CM | POA: Diagnosis not present

## 2014-06-03 DIAGNOSIS — E119 Type 2 diabetes mellitus without complications: Secondary | ICD-10-CM | POA: Diagnosis not present

## 2014-06-03 HISTORY — PX: VAGINAL HYSTERECTOMY: SHX2639

## 2014-06-03 HISTORY — PX: ANTERIOR AND POSTERIOR REPAIR: SHX5121

## 2014-06-03 LAB — CBC
HCT: 38 % (ref 36.0–46.0)
Hemoglobin: 12.4 g/dL (ref 12.0–15.0)
MCH: 27 pg (ref 26.0–34.0)
MCHC: 32.6 g/dL (ref 30.0–36.0)
MCV: 82.6 fL (ref 78.0–100.0)
Platelets: 225 10*3/uL (ref 150–400)
RBC: 4.6 MIL/uL (ref 3.87–5.11)
RDW: 14.5 % (ref 11.5–15.5)
WBC: 7.5 10*3/uL (ref 4.0–10.5)

## 2014-06-03 LAB — GLUCOSE, CAPILLARY
Glucose-Capillary: 142 mg/dL — ABNORMAL HIGH (ref 70–99)
Glucose-Capillary: 193 mg/dL — ABNORMAL HIGH (ref 70–99)
Glucose-Capillary: 200 mg/dL — ABNORMAL HIGH (ref 70–99)
Glucose-Capillary: 202 mg/dL — ABNORMAL HIGH (ref 70–99)
Glucose-Capillary: 174 mg/dL — ABNORMAL HIGH (ref 70–99)

## 2014-06-03 LAB — CREATININE, SERUM
Creatinine, Ser: 0.91 mg/dL (ref 0.50–1.10)
GFR calc Af Amer: 69 mL/min — ABNORMAL LOW (ref >90)
GFR calc non Af Amer: 60 mL/min — ABNORMAL LOW (ref >90)

## 2014-06-03 SURGERY — HYSTERECTOMY, VAGINAL
Anesthesia: General | Site: Vagina

## 2014-06-03 MED ORDER — DIPHENHYDRAMINE HCL 50 MG/ML IJ SOLN: 12.5000 mg | Freq: Four times a day (QID) | INTRAMUSCULAR | Status: DC | PRN

## 2014-06-03 MED ORDER — METFORMIN HCL 500 MG PO TABS: 1000.0000 mg | ORAL_TABLET | Freq: Two times a day (BID) | ORAL | Status: DC

## 2014-06-03 MED ORDER — ROCURONIUM BROMIDE 100 MG/10ML IV SOLN
INTRAVENOUS | Status: AC
  Filled 2014-06-03: qty 1

## 2014-06-03 MED ORDER — CEFAZOLIN SODIUM 1-5 GM-% IV SOLN
1.0000 g | Freq: Three times a day (TID) | INTRAVENOUS | Status: DC
  Filled 2014-06-03 (×2): qty 50

## 2014-06-03 MED ORDER — FENTANYL CITRATE 0.05 MG/ML IJ SOLN
INTRAMUSCULAR | Status: AC
  Filled 2014-06-03: qty 5

## 2014-06-03 MED ORDER — LACTATED RINGERS IV SOLN
INTRAVENOUS | Status: AC
  Administered 2014-06-03 – 2014-06-04 (×3): via INTRAVENOUS

## 2014-06-03 MED ORDER — METFORMIN HCL ER 500 MG PO TB24
1000.0000 mg | ORAL_TABLET | Freq: Two times a day (BID) | ORAL | Status: DC
  Administered 2014-06-04 – 2014-06-05 (×3): 1000 mg via ORAL
  Filled 2014-06-03 (×5): qty 2

## 2014-06-03 MED ORDER — NEOSTIGMINE METHYLSULFATE 10 MG/10ML IV SOLN
INTRAVENOUS | Status: DC | PRN
  Administered 2014-06-03: 4 mg via INTRAVENOUS

## 2014-06-03 MED ORDER — ONDANSETRON HCL 4 MG/2ML IJ SOLN
INTRAMUSCULAR | Status: DC | PRN
  Administered 2014-06-03: 4 mg via INTRAVENOUS

## 2014-06-03 MED ORDER — NEOSTIGMINE METHYLSULFATE 10 MG/10ML IV SOLN
INTRAVENOUS | Status: AC
  Filled 2014-06-03: qty 1

## 2014-06-03 MED ORDER — LACTATED RINGERS IV SOLN
INTRAVENOUS | Status: DC
  Administered 2014-06-03: 1000 mL via INTRAVENOUS
  Administered 2014-06-03 (×2): via INTRAVENOUS

## 2014-06-03 MED ORDER — CEFAZOLIN SODIUM-DEXTROSE 2-3 GM-% IV SOLR
INTRAVENOUS | Status: DC
Start: 2014-06-03 — End: 2014-06-03
  Filled 2014-06-03: qty 50

## 2014-06-03 MED ORDER — PROPOFOL 10 MG/ML IV EMUL
INTRAVENOUS | Status: AC
  Filled 2014-06-03: qty 20

## 2014-06-03 MED ORDER — OXYCODONE-ACETAMINOPHEN 5-325 MG PO TABS
1.0000 | ORAL_TABLET | Freq: Four times a day (QID) | ORAL | Status: DC | PRN
  Administered 2014-06-04 – 2014-06-05 (×3): 1 via ORAL
  Filled 2014-06-03 (×2): qty 1
  Filled 2014-06-03: qty 2

## 2014-06-03 MED ORDER — HEPARIN SODIUM (PORCINE) 5000 UNIT/ML IJ SOLN
INTRAMUSCULAR | Status: AC
  Administered 2014-06-03: 5000 [IU] via SUBCUTANEOUS
  Filled 2014-06-03: qty 1

## 2014-06-03 MED ORDER — 0.9 % SODIUM CHLORIDE (POUR BTL) OPTIME
TOPICAL | Status: DC | PRN
  Administered 2014-06-03: 1000 mL

## 2014-06-03 MED ORDER — BISOPROLOL-HYDROCHLOROTHIAZIDE 2.5-6.25 MG PO TABS: 1.0000 | ORAL_TABLET | Freq: Every day | ORAL | Status: DC

## 2014-06-03 MED ORDER — FENTANYL CITRATE 0.05 MG/ML IJ SOLN
INTRAMUSCULAR | Status: AC
  Administered 2014-06-03: 25 ug via INTRAVENOUS
  Filled 2014-06-03: qty 2

## 2014-06-03 MED ORDER — LIDOCAINE HCL (CARDIAC) 20 MG/ML IV SOLN
INTRAVENOUS | Status: DC | PRN
  Administered 2014-06-03: 100 mg via INTRAVENOUS

## 2014-06-03 MED ORDER — HEPARIN SODIUM (PORCINE) 5000 UNIT/ML IJ SOLN: 5000.0000 [IU] | Freq: Three times a day (TID) | INTRAMUSCULAR | Status: DC

## 2014-06-03 MED ORDER — ONDANSETRON HCL 4 MG/2ML IJ SOLN: 4.0000 mg | Freq: Four times a day (QID) | INTRAMUSCULAR | Status: DC | PRN

## 2014-06-03 MED ORDER — HEPARIN SODIUM (PORCINE) 5000 UNIT/ML IJ SOLN
5000.0000 [IU] | INTRAMUSCULAR | Status: AC
  Administered 2014-06-03: 5000 [IU] via SUBCUTANEOUS

## 2014-06-03 MED ORDER — CEFAZOLIN SODIUM-DEXTROSE 2-3 GM-% IV SOLR: 2.0000 g | INTRAVENOUS | Status: DC

## 2014-06-03 MED ORDER — MEPERIDINE HCL 25 MG/ML IJ SOLN: 6.2500 mg | INTRAMUSCULAR | Status: DC | PRN

## 2014-06-03 MED ORDER — STERILE DILUENT FOR HUMULIN INSULINS: 0.2000 [IU]/kg | Freq: Three times a day (TID) | SUBCUTANEOUS | Status: DC

## 2014-06-03 MED ORDER — GLYCOPYRROLATE 0.2 MG/ML IJ SOLN
INTRAMUSCULAR | Status: DC | PRN
  Administered 2014-06-03: 0.6 mg via INTRAVENOUS

## 2014-06-03 MED ORDER — SODIUM CHLORIDE 0.9 % IV SOLN
INTRAVENOUS | Status: DC
  Filled 2014-06-03: qty 0.4

## 2014-06-03 MED ORDER — FENTANYL CITRATE 0.05 MG/ML IJ SOLN
INTRAMUSCULAR | Status: DC | PRN
  Administered 2014-06-03 (×3): 50 ug via INTRAVENOUS
  Administered 2014-06-03: 100 ug via INTRAVENOUS

## 2014-06-03 MED ORDER — HYDROMORPHONE 0.3 MG/ML IV SOLN
INTRAVENOUS | Status: DC
  Administered 2014-06-03: 0.799 mg via INTRAVENOUS
  Administered 2014-06-03: 0.599 mg via INTRAVENOUS
  Administered 2014-06-03: 0.2 mg via INTRAVENOUS
  Administered 2014-06-03: 13:00:00 via INTRAVENOUS
  Administered 2014-06-04: 0.3 mg via INTRAVENOUS
  Administered 2014-06-04: 0.4 mg via INTRAVENOUS
  Administered 2014-06-04: 0.599 mg via INTRAVENOUS
  Filled 2014-06-03: qty 25

## 2014-06-03 MED ORDER — PHENYLEPHRINE HCL 10 MG/ML IJ SOLN
INTRAMUSCULAR | Status: DC | PRN
  Administered 2014-06-03: 4 mg

## 2014-06-03 MED ORDER — HEPARIN SODIUM (PORCINE) 5000 UNIT/ML IJ SOLN
5000.0000 [IU] | Freq: Three times a day (TID) | INTRAMUSCULAR | Status: AC
  Administered 2014-06-03 – 2014-06-04 (×3): 5000 [IU] via SUBCUTANEOUS
  Filled 2014-06-03 (×3): qty 1

## 2014-06-03 MED ORDER — INSULIN ASPART 100 UNIT/ML ~~LOC~~ SOLN: 16.0000 [IU] | Freq: Three times a day (TID) | SUBCUTANEOUS | Status: DC

## 2014-06-03 MED ORDER — ONDANSETRON HCL 4 MG PO TABS
4.0000 mg | ORAL_TABLET | Freq: Four times a day (QID) | ORAL | Status: DC | PRN
Start: 2014-06-03 — End: 2014-06-03

## 2014-06-03 MED ORDER — INSULIN ASPART 100 UNIT/ML ~~LOC~~ SOLN
12.0000 [IU] | Freq: Once | SUBCUTANEOUS | Status: AC
  Administered 2014-06-03: 12 [IU] via SUBCUTANEOUS

## 2014-06-03 MED ORDER — BISOPROLOL-HYDROCHLOROTHIAZIDE 2.5-6.25 MG PO TABS
1.0000 | ORAL_TABLET | Freq: Every day | ORAL | Status: DC
  Administered 2014-06-04 – 2014-06-05 (×2): 1 via ORAL
  Filled 2014-06-03 (×3): qty 1

## 2014-06-03 MED ORDER — GLYCOPYRROLATE 0.2 MG/ML IJ SOLN
INTRAMUSCULAR | Status: AC
  Filled 2014-06-03: qty 3

## 2014-06-03 MED ORDER — OXYCODONE-ACETAMINOPHEN 5-325 MG PO TABS: 1.0000 | ORAL_TABLET | Freq: Four times a day (QID) | ORAL | Status: DC | PRN

## 2014-06-03 MED ORDER — INSULIN ASPART 100 UNIT/ML ~~LOC~~ SOLN: 12.0000 [IU] | Freq: Three times a day (TID) | SUBCUTANEOUS | Status: DC

## 2014-06-03 MED ORDER — DIPHENHYDRAMINE HCL 12.5 MG/5ML PO ELIX: 12.5000 mg | ORAL_SOLUTION | Freq: Four times a day (QID) | ORAL | Status: DC | PRN

## 2014-06-03 MED ORDER — NALOXONE HCL 0.4 MG/ML IJ SOLN: 0.4000 mg | INTRAMUSCULAR | Status: DC | PRN

## 2014-06-03 MED ORDER — FENTANYL CITRATE 0.05 MG/ML IJ SOLN
25.0000 ug | INTRAMUSCULAR | Status: DC | PRN
  Administered 2014-06-03 (×4): 25 ug via INTRAVENOUS

## 2014-06-03 MED ORDER — SODIUM CHLORIDE 0.9 % IJ SOLN: 9.0000 mL | INTRAMUSCULAR | Status: DC | PRN

## 2014-06-03 MED ORDER — CEFAZOLIN SODIUM 1-5 GM-% IV SOLN
1.0000 g | Freq: Three times a day (TID) | INTRAVENOUS | Status: AC
  Administered 2014-06-03 (×2): 1 g via INTRAVENOUS
  Filled 2014-06-03 (×4): qty 50

## 2014-06-03 MED ORDER — ROCURONIUM BROMIDE 100 MG/10ML IV SOLN
INTRAVENOUS | Status: DC | PRN
  Administered 2014-06-03: 50 mg via INTRAVENOUS

## 2014-06-03 MED ORDER — ONDANSETRON HCL 4 MG PO TABS: 4.0000 mg | ORAL_TABLET | Freq: Four times a day (QID) | ORAL | Status: DC | PRN

## 2014-06-03 MED ORDER — ONDANSETRON HCL 4 MG/2ML IJ SOLN
INTRAMUSCULAR | Status: AC
  Filled 2014-06-03: qty 2

## 2014-06-03 MED ORDER — PROPOFOL 10 MG/ML IV BOLUS
INTRAVENOUS | Status: DC | PRN
  Administered 2014-06-03: 200 mg via INTRAVENOUS

## 2014-06-03 MED ORDER — ONDANSETRON HCL 4 MG/2ML IJ SOLN: 4.0000 mg | Freq: Once | INTRAMUSCULAR | Status: DC | PRN

## 2014-06-03 MED ORDER — KETOROLAC TROMETHAMINE 30 MG/ML IJ SOLN: 15.0000 mg | Freq: Once | INTRAMUSCULAR | Status: DC | PRN

## 2014-06-03 MED ORDER — LIDOCAINE HCL (CARDIAC) 20 MG/ML IV SOLN
INTRAVENOUS | Status: AC
  Filled 2014-06-03: qty 5

## 2014-06-03 SURGICAL SUPPLY — 38 items
CANISTER SUCT 3000ML (MISCELLANEOUS) ×2 IMPLANT
CLOTH BEACON ORANGE TIMEOUT ST (SAFETY) ×2 IMPLANT
CONT PATH 16OZ SNAP LID 3702 (MISCELLANEOUS) ×1 IMPLANT
DECANTER SPIKE VIAL GLASS SM (MISCELLANEOUS) IMPLANT
DRAPE STERI URO 9X17 APER PCH (DRAPES) ×2 IMPLANT
DRSG TELFA 3X8 NADH (GAUZE/BANDAGES/DRESSINGS) ×2 IMPLANT
GAUZE PACKING IODOFORM 1 (PACKING) ×1 IMPLANT
GAUZE PACKING IODOFORM 2 (PACKING) ×1 IMPLANT
GLOVE BIO SURGEON STRL SZ7.5 (GLOVE) ×2 IMPLANT
GLOVE BIOGEL M 7.0 STRL (GLOVE) ×2 IMPLANT
GLOVE BIOGEL PI IND STRL 6.5 (GLOVE) ×1 IMPLANT
GLOVE BIOGEL PI IND STRL 7.5 (GLOVE) IMPLANT
GLOVE BIOGEL PI IND STRL 8 (GLOVE) IMPLANT
GLOVE BIOGEL PI INDICATOR 6.5 (GLOVE) ×1
GLOVE BIOGEL PI INDICATOR 7.5 (GLOVE) ×2
GLOVE BIOGEL PI INDICATOR 8 (GLOVE) ×2
GLOVE ORTHO TXT STRL SZ7.5 (GLOVE) ×1 IMPLANT
GLOVE SURG SS PI 7.0 STRL IVOR (GLOVE) ×1 IMPLANT
GOWN STRL REUS W/TWL LRG LVL3 (GOWN DISPOSABLE) ×9 IMPLANT
NDL SPNL 22GX3.5 QUINCKE BK (NEEDLE) IMPLANT
NEEDLE HYPO 22GX1.5 SAFETY (NEEDLE) IMPLANT
NEEDLE MAYO .5 CIRCLE (NEEDLE) ×2 IMPLANT
NEEDLE SPNL 22GX3.5 QUINCKE BK (NEEDLE) ×2 IMPLANT
NS IRRIG 1000ML POUR BTL (IV SOLUTION) ×2 IMPLANT
PACK VAGINAL WOMENS (CUSTOM PROCEDURE TRAY) ×2 IMPLANT
PAD DRESSING TELFA 3X8 NADH (GAUZE/BANDAGES/DRESSINGS) ×1 IMPLANT
PAD OB MATERNITY 4.3X12.25 (PERSONAL CARE ITEMS) ×2 IMPLANT
SUT VIC AB 0 CT1 18XCR BRD8 (SUTURE) ×3 IMPLANT
SUT VIC AB 0 CT1 27 (SUTURE) ×4
SUT VIC AB 0 CT1 27XBRD ANBCTR (SUTURE) ×2 IMPLANT
SUT VIC AB 0 CT1 8-18 (SUTURE) ×10
SUT VIC AB 1 CT1 36 (SUTURE) ×2 IMPLANT
SUT VIC AB 3-0 SH 27 (SUTURE) ×2
SUT VIC AB 3-0 SH 27X BRD (SUTURE) IMPLANT
SUT VICRYL 0 TIES 12 18 (SUTURE) ×2 IMPLANT
TOWEL OR 17X24 6PK STRL BLUE (TOWEL DISPOSABLE) ×4 IMPLANT
TRAY FOLEY CATH 14FR (SET/KITS/TRAYS/PACK) ×2 IMPLANT
WATER STERILE IRR 1000ML POUR (IV SOLUTION) ×2 IMPLANT

## 2014-06-03 NOTE — Anesthesia Postprocedure Evaluation (Signed)
  Anesthesia Post-op Note  Anesthesia Post Note  Patient: [name]  Procedure(s) Performed: Procedure(s) (LRB): HYSTERECTOMY VAGINAL (N/A) ANTERIOR (CYSTOCELE) AND POSTERIOR REPAIR (RECTOCELE) (N/A)  Anesthesia type: General  Patient location: PACU  Post pain: Pain level controlled  Post assessment: Post-op Vital signs reviewed  Last Vitals:  Filed Vitals:   06/03/14 1115  BP: 127/60  Pulse: 65  Temp:   Resp: 16    Post vital signs: Reviewed  Level of consciousness: sedated  Complications: No apparent anesthesia complications

## 2014-06-03 NOTE — Progress Notes (Signed)
Patient ID: [name], female   DOB: [birth year], 78 y.o.   MRN: [id number] I examined this lady 06-01-14 and she reports no change in her health since that time. She takes humolog 12 units with breakfast and lunch and 18 units with supper and she takes humulin N 17 units at bedtime. She takes 1000 mg metformin morning and night.

## 2014-06-03 NOTE — Op Note (Signed)
NAMEJAHARA, [name] NO.:  [id number]  MEDICAL RECORD NO.:  [id number]  LOCATION:  [facility]                          FACILITY:  [facility]  PHYSICIAN:  Madisan Passy. Ulanda Edison, M.D. DATE OF BIRTH:  1936-09-17  DATE OF PROCEDURE:  06/03/2014 DATE OF DISCHARGE:                              OPERATIVE REPORT   PREOPERATIVE DIAGNOSES:  Uterine prolapse, fourth degree; cystocele, fourth degree; smaller rectocele.  POSTOPERATIVE DIAGNOSES:  Uterine prolapse, fourth degree; cystocele, fourth degree; smaller rectocele with leiomyomata uteri.  OPERATION:  Vaginal hysterectomy, A and P repair.  OPERATOR:  Tacia Passy. Ulanda Edison, MD  ASSISTANT:  Clarene Duke, MD.  ANESTHESIA:  General anesthesia.  DESCRIPTION OF PROCEDURE:  The patient was brought to the operating room and placed under satisfactory general anesthesia and placed in the dorsal lithotomy position.  A time-out was done.  The patient's vulva, vagina, perineum and urethra were prepped with Betadine solution.  A Foley catheter was inserted to straight drain.  Exam revealed the cystocele presenting at the introitus, smaller rectocele, the uterus was hard to feel but was thought to be slightly anterior, normal size.  The adnexa were free of masses.  The area was draped as a sterile field.  A weighted speculum was placed posteriorly.  A Deaver retractor placed anteriorly.  The cervix was grasped with Lahey clamps.  The cervicovaginal junction was injected with a dilute solution of Neo- Synephrine.  A circumferential incision was made around the cervix at the cervicovaginal junction.  The bladder was pushed anteriorly.  The cul-de-sac was entered.  The uterosacral ligaments were clamped, cut, and suture ligated and held.  The cardinal ligaments were treated in the same fashion but cut.  The anterior peritoneum was entered without difficulty.  The bladder was retracted away.  Two extra bites along the broad ligament were taken  bilaterally with clamp cutting and suture ligating.  The uterus was then inverted through the incision and the cul- de-sac.  There was a fibroid on the left lateral surface of the uterus. The uterus was removed by transecting the upper pedicles.  They were doubly suture ligated and held.  The peritoneum was then reapproximated with a running suture of  #1 Vicryl.  Before it was tied down an extra suture was placed in the uterosacral ligaments to exit through the vaginal mucosa at the end of the procedure.  This was then tied down and held.  The pursestring suture was tied down.  Sponge and needle counts were correct.  The anterior vaginal mucosa was then separated from the large cystocele.  The cystocele was developed by blunt dissection and sharp dissection.  It was reduced by imbricating sutures of 0 Vicryl. Redundant vaginal mucosa was cut away and then the vagina was reunited in the midline with interrupted figure-of-eight sutures of 0 Vicryl. The vaginal mucosa closure was continued to the cuff, then the posterior vaginal fourchette was cut across.  The posterior vaginal mucosa was separated from the rectocele.  The rectocele was obliterated with interrupted sutures of 0 Vicryl.  Redundant vaginal mucosa was cut away and the vaginal mucosa was reunited in the midline with a figure-of- eight sutures of 0 Vicryl.  The  perineum was reapproximated with 3-0 Vicryl suture.  One bleeding point on the posterior vaginal mucosa was attended to with a figure-of-eight suture of 0 Vicryl and then a 2 inch iodoform pack was placed in the vagina and the procedure was terminated.  Blood loss was estimated by me at about 200 mL.  Sponge and needle counts were correct, and the patient was returned to recovery in satisfactory condition.     Kaydyn Passy. Ulanda Edison, M.D.     TFH/MEDQ  D:  06/03/2014  T:  06/03/2014  Job:  [id number]

## 2014-06-03 NOTE — Transfer of Care (Signed)
2Immediate Anesthesia Transfer of Care Note  Patient: [name]  Procedure(s) Performed: Procedure(s) with comments: HYSTERECTOMY VAGINAL (N/A) - 2hrs OR time ANTERIOR (CYSTOCELE) AND POSTERIOR REPAIR (RECTOCELE) (N/A)  Patient Location: PACU  Anesthesia Type:General  Level of Consciousness: awake, alert  and sedated  Airway & Oxygen Therapy: Patient Spontanous Breathing and Patient connected to nasal cannula oxygen  Post-op Assessment: Report given to PACU RN and Post -op Vital signs reviewed and stable  Post vital signs: Reviewed and stable  Complications: No apparent anesthesia complications

## 2014-06-03 NOTE — Anesthesia Preprocedure Evaluation (Signed)
Anesthesia Evaluation  Patient identified by MRN, date of birth, ID band Patient awake    Reviewed: Allergy & Precautions, H&P , NPO status , Patient's Chart, lab work & pertinent test results, reviewed documented beta blocker date and time   Airway Mallampati: II TM Distance: >3 FB Neck ROM: full    Dental no notable dental hx. (+) Teeth Intact   Pulmonary neg pulmonary ROS,    Pulmonary exam normal       Cardiovascular hypertension, Pt. on home beta blockers     Neuro/Psych negative neurological ROS  negative psych ROS   GI/Hepatic Neg liver ROS, GERD-  Medicated and Controlled,  Endo/Other  diabetes, Well Controlled, Type 1, Insulin Dependent, Oral Hypoglycemic Agents  Renal/GU negative Renal ROS     Musculoskeletal   Abdominal Normal abdominal exam  (+)   Peds  Hematology negative hematology ROS (+)   Anesthesia Other Findings   Reproductive/Obstetrics negative OB ROS                           Anesthesia Physical Anesthesia Plan  ASA: II  Anesthesia Plan: General   Post-op Pain Management:    Induction: Intravenous  Airway Management Planned: LMA and Oral ETT  Additional Equipment:   Intra-op Plan:   Post-operative Plan: Extubation in OR  Informed Consent: I have reviewed the patients History and Physical, chart, labs and discussed the procedure including the risks, benefits and alternatives for the proposed anesthesia with the patient or authorized representative who has indicated his/her understanding and acceptance.   Dental Advisory Given  Plan Discussed with: CRNA, Anesthesiologist and Surgeon  Anesthesia Plan Comments: (Normal nuclear imaging study in 09/2013.)        Anesthesia Quick Evaluation

## 2014-06-03 NOTE — Progress Notes (Addendum)
Day of Surgery Procedure(s) (LRB): HYSTERECTOMY VAGINAL (N/A) ANTERIOR (CYSTOCELE) AND POSTERIOR REPAIR (RECTOCELE) (N/A)  Subjective: Patient reports tolerating PO with no nausea.  She is taking a small amount of clear liquid diet.  Pain controlled  Objective: I have reviewed patient's vital signs, intake and output and labs.  General: alert and cooperative GI: soft NT  Assessment: s/p Procedure(s) with comments: HYSTERECTOMY VAGINAL (N/A) - 2hrs OR time ANTERIOR (CYSTOCELE) AND POSTERIOR REPAIR (RECTOCELE) (N/A): stable  Plan: Pt is a diabetic and reports her insulin regimen at home is typically 12units humalog prior to breakfast/ 12 units prior to lunch/ 16 units before dinner and 18 units of NPH at bedtime.   She is really not eating very much yet, so will back off her insulin a bit this PM.  She does not have any NPH ordered currently.  Was 200 prior to dinner so gave 12 units.  She is not sure if she will eat a night time snack.  Pain controlled. Adequate UOP  LOS: 0 days    [name] 06/03/2014, 7:07 PM

## 2014-06-03 NOTE — Progress Notes (Signed)
Patient ID: [name], female   DOB: March 05, 1936, 78 y.o.   MRN: [id number] Op note:   VH A&P repair for prolapse under general anesthesia Op : Zuria Fosdick, Asst: Meisinger EBL 200 cc's.

## 2014-06-04 ENCOUNTER — Encounter (HOSPITAL_COMMUNITY): Payer: Self-pay | Admitting: Obstetrics and Gynecology

## 2014-06-04 DIAGNOSIS — N812 Incomplete uterovaginal prolapse: Secondary | ICD-10-CM | POA: Diagnosis not present

## 2014-06-04 LAB — CBC WITH DIFFERENTIAL/PLATELET
Basophils Absolute: 0 10*3/uL (ref 0.0–0.1)
Basophils Relative: 0 % (ref 0–1)
Eosinophils Absolute: 0.1 10*3/uL (ref 0.0–0.7)
Eosinophils Relative: 1 % (ref 0–5)
HCT: 35.1 % — ABNORMAL LOW (ref 36.0–46.0)
Hemoglobin: 11.6 g/dL — ABNORMAL LOW (ref 12.0–15.0)
Lymphocytes Relative: 16 % (ref 12–46)
Lymphs Abs: 1.4 10*3/uL (ref 0.7–4.0)
MCH: 27.4 pg (ref 26.0–34.0)
MCHC: 33 g/dL (ref 30.0–36.0)
MCV: 83 fL (ref 78.0–100.0)
Monocytes Absolute: 0.8 10*3/uL (ref 0.1–1.0)
Monocytes Relative: 8 % (ref 3–12)
Neutro Abs: 6.8 10*3/uL (ref 1.7–7.7)
Neutrophils Relative %: 75 % (ref 43–77)
Platelets: 226 10*3/uL (ref 150–400)
RBC: 4.23 MIL/uL (ref 3.87–5.11)
RDW: 14.5 % (ref 11.5–15.5)
WBC: 9.1 10*3/uL (ref 4.0–10.5)

## 2014-06-04 LAB — GLUCOSE, CAPILLARY
Glucose-Capillary: 219 mg/dL — ABNORMAL HIGH (ref 70–99)
Glucose-Capillary: 274 mg/dL — ABNORMAL HIGH (ref 70–99)
Glucose-Capillary: 292 mg/dL — ABNORMAL HIGH (ref 70–99)
Glucose-Capillary: 229 mg/dL — ABNORMAL HIGH (ref 70–99)
Glucose-Capillary: 207 mg/dL — ABNORMAL HIGH (ref 70–99)

## 2014-06-04 MED ORDER — INSULIN ASPART 100 UNIT/ML ~~LOC~~ SOLN: 12.0000 [IU] | Freq: Every morning | SUBCUTANEOUS | Status: DC

## 2014-06-04 MED ORDER — INSULIN ASPART 100 UNIT/ML ~~LOC~~ SOLN
18.0000 [IU] | Freq: Every day | SUBCUTANEOUS | Status: DC
  Administered 2014-06-04: 100 [IU] via SUBCUTANEOUS

## 2014-06-04 MED ORDER — INSULIN NPH (HUMAN) (ISOPHANE) 100 UNIT/ML ~~LOC~~ SUSP
17.0000 [IU] | Freq: Every day | SUBCUTANEOUS | Status: DC
  Administered 2014-06-04: 17 [IU] via SUBCUTANEOUS
  Filled 2014-06-04: qty 10

## 2014-06-04 MED ORDER — INSULIN ASPART 100 UNIT/ML ~~LOC~~ SOLN
12.0000 [IU] | Freq: Two times a day (BID) | SUBCUTANEOUS | Status: DC
  Administered 2014-06-04 (×2): 12 [IU] via SUBCUTANEOUS
  Administered 2014-06-05: 100 [IU] via SUBCUTANEOUS

## 2014-06-04 NOTE — Anesthesia Postprocedure Evaluation (Signed)
  Anesthesia Post-op Note  Patient: [name]  Procedure(s) Performed: Procedure(s) with comments: HYSTERECTOMY VAGINAL (N/A) - 2hrs OR time ANTERIOR (CYSTOCELE) AND POSTERIOR REPAIR (RECTOCELE) (N/A)  Patient Location: Women's Unit  Anesthesia Type:General  Level of Consciousness: awake, alert , oriented and patient cooperative  Airway and Oxygen Therapy: Patient Spontanous Breathing  Post-op Pain: mild  Post-op Assessment: Patient's Cardiovascular Status Stable, Respiratory Function Stable, No signs of Nausea or vomiting and Pain level controlled  Post-op Vital Signs: stable  Last Vitals:  Filed Vitals:   06/04/14 1000  BP: 127/53  Pulse: 84  Temp: 37.1 C  Resp: 18    Complications: No apparent anesthesia complications

## 2014-06-04 NOTE — Progress Notes (Signed)
Patient ID: [name], female   DOB: 1936-01-10, 78 y.o.   MRN: [id number] #1 afebrile BP normal  Abdomen soft not especially tender Output excellent CBC not done yet Pack out moderate staining.

## 2014-06-04 NOTE — Progress Notes (Signed)
Inpatient Diabetes Program Recommendations  AACE/ADA: New Consensus Statement on Inpatient Glycemic Control (2013)  Target Ranges:  Prepandial:   less than 140 mg/dL      Peak postprandial:   less than 180 mg/dL (1-2 hours)      Critically ill patients:  140 - 180 mg/dL   Results for [name], [name] (MRN [id number]) as of 06/04/2014 09:14  Ref. Range 06/03/2014 06:29 06/03/2014 10:02 06/03/2014 15:49 06/03/2014 18:14 06/03/2014 22:34 06/04/2014 05:42 06/04/2014 07:46  Glucose-Capillary Latest Range: 70-99 mg/dL 142 (H) 193 (H) 200 (H) 202 (H) 174 (H) 219 (H) 274 (H)    Diabetes history: DM2 Outpatient Diabetes medications: Humulin N 17 QHS, Humalog 12 units with breakfast/12 units with lunch/18 with supper, Metformin 1000 BID Current orders for Inpatient glycemic control: NPH 17 units QHS, Novolog 12 units with breakfast, Novolog 12 units with lunch, Novolog 18 units with supper, and Metformin 1000 mg BID  Inpatient Diabetes Program Recommendations Correction (SSI): Please consider ordering Novolog sensitive correction ACHS (in addition to ordered meal coverage).  Thanks, Barnie Alderman, RN, MSN, CCRN Diabetes Coordinator Inpatient Diabetes Program [phone] (Team Pager) [phone] ([facility] office) [phone] [facility] office)

## 2014-06-04 NOTE — Addendum Note (Signed)
Addendum created 06/04/14 1039 by Georgeanne Nim, CRNA   Modules edited: Notes Section   Notes Section:  File: [id number]

## 2014-06-05 DIAGNOSIS — N812 Incomplete uterovaginal prolapse: Secondary | ICD-10-CM | POA: Diagnosis not present

## 2014-06-05 LAB — GLUCOSE, CAPILLARY: Glucose-Capillary: 291 mg/dL — ABNORMAL HIGH (ref 70–99)

## 2014-06-05 MED ORDER — OXYCODONE-ACETAMINOPHEN 5-325 MG PO TABS: 1.0000 | ORAL_TABLET | Freq: Four times a day (QID) | ORAL | Status: DC | PRN

## 2014-06-05 NOTE — Progress Notes (Signed)
Patient ID: [name], female   DOB: 1936-11-13, 78 y.o.   MRN: [id number] #2 afebrile Doing well for d/c

## 2014-06-05 NOTE — H&P (Signed)
[name], [name] NO.:  [id number]  MEDICAL RECORD NO.:  [id number]  LOCATION:                                 FACILITY:  PHYSICIAN:  Adreana Passy. Ulanda Edison, M.D. DATE OF BIRTH:  25-Aug-1936  DATE OF ADMISSION:  06/03/2014 DATE OF DISCHARGE:                             HISTORY & PHYSICAL   PRESENT ILLNESS:  This is a 78 year old black female, para 2-0-2-2, who is admitted to the hospital for vaginal hysterectomy, A and P repair because of symptomatic uterine and bladder prolapse.  The patient had her period probably at least 20 years ago.  She has been known to have uterine prolapse and cystocele for at least 12 years.  She used a pessary for some time but tired of the pessary did not like the way it made her feel, and she is admitted now for vaginal hysterectomy, A and P repair.  She claims that the uterine prolapses caused her problems.  She has had some fecal incontinence.  Her bowel movements are smaller in caliber, gets diarrhea occasionally.  When she was seen in 2014, at that time, she had never tried a pessary because of her symptoms with the prolapse.  She was tried on a pessary as she kept the pessary in from December 2014 until June 2015 when she has to have the pessary removed. She does not have stress urinary incontinence.  She has claimed at times in the past that the prolapse causes her to have difficulty initiating a urinary stream and caused some splaying of the urinary stream.  She is bothered by the prolapse especially when she is on her feet, and she is ready to have it repaired.  PAST MEDICAL HISTORY: 1. High blood pressure. 2. High cholesterol. 3. Diabetes.  PAST SURGICAL HISTORY:  She had 1 abortion and 1 spontaneous abortion, D and Cs for both.  ALLERGIES:  No known drug allergies.  SOCIAL HISTORY:  The patient never smoked.  Does not drink.  She is Sales promotion account executive.  FAMILY HISTORY:  Brother cancer of the colon.  MEDICATIONS:   Include medication for diabetes pravastatin and gabapentin medication for high blood pressure.  I have discussed this patient with Dr. Karlton Lemon, her medical doctor, and she feels that she is a good candidate for surgery.  She had a recent blood sugar of 274 but the patient states that her blood sugars have been well controlled, and Dr. Karlton Lemon does not feel this is a problem. She also had slightly elevated liver function tests.  Dr. Karlton Lemon says that her liver function tests have always been normal and slightly elevated.  PHYSICAL EXAMINATION:  GENERAL:  On admission reveals a well-developed black female, in no distress. VITAL SIGNS:  Her blood pressure is 140/80, she is 5 feet 4 inches, weighs 176 pounds, has a BMI of 30. HEAD, EYES, NOSE AND THROAT:  Normal. NECK:  Supple without thyromegaly. BREASTS:  Have been examined within the last year and showed no masses. ABDOMEN:  Soft and nontender. HEART:  Normal size and sounds.  No murmurs. LUNGS:  Clear to auscultation. GU:  Vulva and vagina showed that the cervix protrudes through the introitus.  There is a fourth-degree  cystocele.  The external genitalia are normal.  The uterus is hard to feel thought to be small.  Adnexa are clear. RECTAL:  Last year has been normal.  ADMITTING IMPRESSION:  Uterovaginal prolapse with fourth-degree cystocele, and fourth degree prolapse of the cervix, hypertension, diabetes, high cholesterol.  The patient is admitted for a vaginal hysterectomy, A and P repair.  She understands the risks involved.  She has been counseled about them and is ready to proceed.     Naara Passy. Ulanda Edison, M.D.     TFH/MEDQ  D:  06/02/2014  T:  06/02/2014  Job:  [id number]

## 2014-06-05 NOTE — Progress Notes (Signed)
Pt is discharged in the care of daughter. Discharged instructions with Rx were given to pt. Questions were asked and answered. Denies vaginal bleeding,Pain or discomfort.. No equipment needed  for home use..Stable.

## 2014-06-05 NOTE — Discharge Summary (Signed)
NAMEMACYN, Beverly NO.:  [id number]  MEDICAL RECORD NO.:  [id number]  LOCATION:  9303                          FACILITY:  [facility]  PHYSICIAN:  Trilby [name]. Ulanda Edison, M.D. DATE OF BIRTH:  1936/08/01  DATE OF ADMISSION:  06/03/2014 DATE OF DISCHARGE:                              DISCHARGE SUMMARY   HISTORY OF PRESENT ILLNESS:  This is a 78 year old black female, who was admitted to the hospital for vaginal hysterectomy, A and P repair because of a prolapse of the uterus and she did not like the pessary and she wanted to correct it.  The patient underwent a vaginal hysterectomy, A and P repair by Dr. Ulanda Edison with Dr. Willis Modena assisting under general anesthesia on June 05, 2014,  She did well postoperatively and is ready for discharge on the second postoperative day.  She has been able to tolerate food, ambulated well, and is voided well since her catheter has been removed.  The packing was removed on the first postop day, she has remained afebrile.  Her capillary blood glucoses have been 274, 292, 229, 207, and 291.  This compares to a preop blood sugar of 274.  Her path report is pending.  FINAL DIAGNOSES:  Uterine prolapse with large cystocele, smaller rectocele, diabetes, high blood pressure, high cholesterol,  OPERATIONS:  Vaginal hysterectomy, A and P repair.  FINAL CONDITION:  Improved.  INSTRUCTIONS:  Include our regular discharge instructions.  No vaginal entrance.  No heavy lifting or strenuous activity.  Call with temperature elevation greater than 100.4 degrees.  Call with any problems.  Return to the office in 2 weeks for followup examination. She is to continue the medication she is on at home, including bisoprolol and hydrochlorothiazide, insulin with each meal, and Humulin N at night, metformin, eye drops, and I have given her a prescription for Percocet 5/325, 30 tablets 1 every 6 hours as needed for pain, and I have advised her to return to the  office in 2 weeks for followup examination.     [name]. Ulanda Edison, M.D.     TFH/MEDQ  D:  06/05/2014  T:  06/05/2014  Job:  [id number]

## 2014-06-05 NOTE — Discharge Instructions (Signed)
No heavy lifting. Call with any problems, temp> 100. 4 degrees, heavy vaginal bleeding, vomiting or other problems.

## 2015-01-01 ENCOUNTER — Ambulatory Visit: Payer: Self-pay | Admitting: Internal Medicine

## 2015-03-25 ENCOUNTER — Other Ambulatory Visit: Payer: Self-pay | Admitting: Orthopaedic Surgery

## 2015-03-25 DIAGNOSIS — M542 Cervicalgia: Secondary | ICD-10-CM

## 2015-04-07 ENCOUNTER — Ambulatory Visit
Admission: RE | Admit: 2015-04-07 | Discharge: 2015-04-07 | Disposition: A | Discharge status: Home / Self Care | Payer: Medicare Other | Source: Ambulatory Visit | Attending: Orthopaedic Surgery | Admitting: Orthopaedic Surgery

## 2015-04-07 DIAGNOSIS — M542 Cervicalgia: Secondary | ICD-10-CM

## 2015-05-10 ENCOUNTER — Other Ambulatory Visit: Payer: Self-pay

## 2015-05-12 ENCOUNTER — Ambulatory Visit: Payer: Medicare Other | Attending: Internal Medicine | Admitting: Physical Therapy

## 2015-05-12 DIAGNOSIS — M25512 Pain in left shoulder: Secondary | ICD-10-CM | POA: Diagnosis present

## 2015-05-12 DIAGNOSIS — M62838 Other muscle spasm: Secondary | ICD-10-CM | POA: Insufficient documentation

## 2015-05-12 NOTE — Therapy (Signed)
[place], [place], [address] Phone: [phone]   Fax:  [phone]  Physical Therapy Evaluation  Patient Details  Name: [name] MRN: [id number] Date of Birth: [birth year] Referring Provider:  Willey Blade, MD  Encounter Date: 05/12/2015      PT End of Session - 05/12/15 1250    Visit Number 1   Number of Visits 6   Date for PT Re-Evaluation 06/23/15   PT Start Time 0800   PT Stop Time 0845   PT Time Calculation (min) 45 min   Activity Tolerance Patient tolerated treatment well   Behavior During Therapy [facility] for tasks assessed/performed      Past Medical History  Diagnosis Date  . HYPERLIPIDEMIA 08/13/2007  . HYPERTENSION 08/13/2007  . INSOMNIA 08/13/2007  . SVD (spontaneous vaginal delivery)     x 2  . Type 2 DM with ketoacidosis 08/13/2007  . GERD (gastroesophageal reflux disease)     Past Surgical History  Procedure Laterality Date  . Stress cardiolite  02/19/2007  . Hernia repair    . Appendectomy    . Right hand surgery    . Eye surgery      bilateral cataracts  . Dilation and curettage of uterus    . Colonoscopy    . Vaginal hysterectomy N/A 06/03/2014    Procedure: HYSTERECTOMY VAGINAL;  Surgeon: Melina Schools, MD;  Location: [facility];  Service: Gynecology;  Laterality: N/A;  2hrs OR time  . Anterior and posterior repair N/A 06/03/2014    Procedure: ANTERIOR (CYSTOCELE) AND POSTERIOR REPAIR (RECTOCELE);  Surgeon: Melina Schools, MD;  Location: [facility];  Service: Gynecology;  Laterality: N/A;    There were no vitals filed for this visit.  Visit Diagnosis:  Muscle spasm of left shoulder area - Plan: PT plan of care cert/re-cert  Pain in joint, shoulder region, left - Plan: PT plan of care cert/re-cert      Subjective Assessment - 05/12/15 0809    Subjective pt is a 79 y.o F with CC of L shoulder and left side of neck muscle tightness with pain that has been going on  for about a month.  She reports since onset the pain is getting worse. She got an injection in the cervical region 05/06/2015 which she reports mild relief.     Patient is accompained by: Family member  daughter   How long can you sit comfortably? unlimited   How long can you stand comfortably? 1 hour   How long can you walk comfortably? 1 hour   Diagnostic tests MRI 04/07/2015 per pt report arthritis in the cervical spine   Patient Stated Goals to be pain free, and get back to exercising   Currently in Pain? Yes   Pain Score 0-No pain   Pain Location Shoulder   Pain Orientation Left   Pain Type Chronic pain   Pain Onset 1 to 4 weeks ago   Pain Frequency Intermittent   Aggravating Factors  when getting aggricated with dog   Pain Relieving Factors moving the arm around   Multiple Pain Sites Yes   Pain Score 3   Pain Location Arm   Pain Orientation Left   Pain Onset 1 to 4 weeks ago   Aggravating Factors  unknown of what causes pain   Pain Relieving Factors moving arm around            [facility] PT Assessment - 05/12/15 0816    Assessment  Medical Diagnosis L sided neck and shoulder muscle tightness   Onset Date/Surgical Date --  about 1 month ago   Hand Dominance Right   Next MD Visit 05/26/2015   Prior Therapy no   Precautions   Precautions None   Restrictions   Weight Bearing Restrictions No   Balance Screen   Has the patient fallen in the past 6 months No   Has the patient had a decrease in activity level because of a fear of falling?  No   Is the patient reluctant to leave their home because of a fear of falling?  No   Home Environment   Living Environment Private residence   Living Arrangements Alone   Available Help at Discharge Available 24 hours/day;Available PRN/intermittently   Type of Home House   Home Access Stairs to enter   Entrance Stairs-Number of Steps 5   Home Layout One level   Prior Function   Level of Independence Independent;Independent with basic  ADLs   Vocation Retired   Leisure sitting around the house.    Cognition   Overall Cognitive Status Within Functional Limits for tasks assessed   Posture/Postural Control   Posture/Postural Control Postural limitations   Postural Limitations Rounded Shoulders;Forward head;Increased thoracic kyphosis   ROM / Strength   AROM / PROM / Strength AROM;Strength   AROM   Overall AROM  --  shoulder AROM WFL   AROM Assessment Site Shoulder;Cervical   Right/Left Shoulder Right;Left   Cervical Flexion 50   Cervical Extension 40   Cervical - Right Side Bend 40   Cervical - Left Side Bend 40   Cervical - Right Rotation 50   Cervical - Left Rotation 50   Strength   Strength Assessment Site Shoulder   Right/Left Shoulder Right;Left   Right Shoulder Flexion 4+/5   Right Shoulder Extension 4+/5   Right Shoulder ABduction 4+/5   Right Shoulder Internal Rotation 4+/5   Right Shoulder External Rotation 4+/5   Left Shoulder Flexion 4+/5   Left Shoulder Extension 4+/5   Left Shoulder ABduction 4+/5   Left Shoulder Internal Rotation 4+/5   Left Shoulder External Rotation 4+/5   Special Tests    Special Tests Rotator Cuff Impingement;Cervical   Cervical Tests Spurling's;Dictraction;other   Rotator Cuff Impingment tests Michel Bickers test;Empty Can test;Speed's test;Drop Arm test   other    Findings --  Upper limb tension test                   [facility] Adult PT Treatment/Exercise - 05/12/15 0816    Exercises   Exercises Shoulder   Shoulder Exercises: Seated   Other Seated Exercises Deep neck flexor 10 x 5 sec  VC to perform chin tuck   Shoulder Exercises: Stretch   Corner Stretch 2 reps;30 seconds   Other Shoulder Stretches upper trap and levator scap 2 x 30 sec                PT Education - 05/12/15 1249    Education provided Yes   Education Details evalution findings, POC, goals, HEP, anatomical education   Person(s) Educated Patient   Methods Explanation    Comprehension Verbalized understanding          PT Short Term Goals - 05/12/15 1258    PT SHORT TERM GOAL #1   Title pt will be I with basic HEP (06/02/2015)   Time 3   Period Weeks   Status New  PT Long Term Goals - May 30, 2015 1258    PT LONG TERM GOAL #1   Title upon discharge pt will be I with all exercises given through out therapy (06/23/2015)   Time 6   Period Weeks   Status New   PT LONG TERM GOAL #2   Title pt will demonstrate < 1/10 pain during and following liftind and carrying > 5-10# overhead to assist with personal exercise (06/23/2015)   Time 6   Period Weeks   Status New   PT LONG TERM GOAL #3   Title She will decrease muscle spasm of the L shoulder musculature to decraease referred pain to the left arm to assist with ADLs (06/23/2015)   Time 6   Period Weeks   Status New   PT LONG TERM GOAL #4   Title pt will be able to verbalize and dmeonstrate tehcniques to reduce risk or shoulder and neck reinjury via psotural awareness, lifting and carrying mechanics, and HEP (06/23/2015)   Time 6   Period Weeks   Status New               Plan - 30-May-2015 1250    Clinical Impression Statement [name] presents to OPPT with CC of left shoulder and neck pain secondary to muscle tightness. She demonstrates limited cervical mobility, but demonstrates full funcitonal shoulder mobility and strength. Palpation reveals tightness in L upper trap , levator scapul and sub-occipital  tightness with intermittent referral down the left arm to the lateral forearm. She demonstrate forward head posture and mild increased thoracic kyphosis. She would benefit from skilled physical therapy to maximize her funciton by addressing the impairments listed.    Pt will benefit from skilled therapeutic intervention in order to improve on the following deficits Decreased activity tolerance;Decreased endurance;Impaired UE functional use;Pain;Increased muscle spasms   Rehab Potential Good    PT Frequency 1x / week   PT Duration 6 weeks   PT Treatment/Interventions ADLs/Self Care Home Management;Cryotherapy;Electrical Stimulation;Moist Heat;Ultrasound;Therapeutic activities;Therapeutic exercise;Neuromuscular re-education;Patient/family education;Manual techniques;Dry needling;Taping   PT Next Visit Plan assess reponse to HEP, dry needling, stretching and strengtheing of shoulders, posture education   PT Home Exercise Plan upper trap, levator scap, Deep neck flexors, doorway stretch   Consulted and Agree with Plan of Care Patient          G-Codes - 30-May-2015 1302    Functional Assessment Tool Used clinical judgement    Functional Limitation Carrying, moving and handling objects   Carrying, Moving and Handling Objects Current Status [id number]) At least 20 percent but less than 40 percent impaired, limited or restricted   Carrying, Moving and Handling Objects Goal Status ([id number]) At least 1 percent but less than 20 percent impaired, limited or restricted       Problem List Patient Active Problem List   Diagnosis Date Noted  . Prolapse of anterior vaginal wall 06/03/2014  . Chest pressure 09/16/2013  . GERD (gastroesophageal reflux disease) 09/16/2013  . Pain in limb 03/16/2008  . Type 1 diabetes, controlled, with neuropathy 08/13/2007  . HYPERLIPIDEMIA 08/13/2007  . HYPERTENSION 08/13/2007  . INSOMNIA 08/13/2007  . WEIGHT GAIN 08/13/2007   Starr Lake PT, DPT, LAT, ATC  05-30-2015  1:12 PM    [facility] [address] [place], [place], [address] Phone: [phone]   Fax:  [phone]

## 2015-05-12 NOTE — Patient Instructions (Signed)
   Epifanio Labrador PT, DPT, LAT, ATC  [facility] Phone: [phone]     

## 2015-05-27 ENCOUNTER — Ambulatory Visit: Payer: Medicare Other | Attending: Internal Medicine | Admitting: Physical Therapy

## 2015-05-27 DIAGNOSIS — M25512 Pain in left shoulder: Secondary | ICD-10-CM | POA: Insufficient documentation

## 2015-05-27 DIAGNOSIS — M62838 Other muscle spasm: Secondary | ICD-10-CM | POA: Insufficient documentation

## 2015-05-27 NOTE — Therapy (Signed)
[name], [place], [address] Phone: [phone]   Fax:  [phone]  Physical Therapy Treatment  Patient Details  Name: [name] MRN: [id number] Date of Birth: [birth year] Referring Provider:  Willey Blade, MD  Encounter Date: 05/27/2015      PT End of Session - 05/27/15 1641    Visit Number 2   Number of Visits 6   Date for PT Re-Evaluation 06/23/15   PT Start Time 1500   PT Stop Time 1600   PT Time Calculation (min) 60 min   Activity Tolerance Patient tolerated treatment well      Past Medical History  Diagnosis Date  . HYPERLIPIDEMIA 08/13/2007  . HYPERTENSION 08/13/2007  . INSOMNIA 08/13/2007  . SVD (spontaneous vaginal delivery)     x 2  . Type 2 DM with ketoacidosis 08/13/2007  . GERD (gastroesophageal reflux disease)     Past Surgical History  Procedure Laterality Date  . Stress cardiolite  02/19/2007  . Hernia repair    . Appendectomy    . Right hand surgery    . Eye surgery      bilateral cataracts  . Dilation and curettage of uterus    . Colonoscopy    . Vaginal hysterectomy N/A 06/03/2014    Procedure: HYSTERECTOMY VAGINAL;  Surgeon: Melina Schools, MD;  Location: [facility];  Service: Gynecology;  Laterality: N/A;  2hrs OR time  . Anterior and posterior repair N/A 06/03/2014    Procedure: ANTERIOR (CYSTOCELE) AND POSTERIOR REPAIR (RECTOCELE);  Surgeon: Melina Schools, MD;  Location: [facility];  Service: Gynecology;  Laterality: N/A;    There were no vitals filed for this visit.  Visit Diagnosis:  Muscle spasm of left shoulder area  Pain in joint, shoulder region, left      Subjective Assessment - 05/27/15 1507    Subjective neck and left UE pain, numbness to 4th and 5th finger aggravated with sitting shifted to the right and looking down.  Better with right sidebending stretch   Currently in Pain? Yes   Pain Score 2    Pain Location Neck   Pain Orientation Left    Pain Type Chronic pain   Pain Frequency Intermittent   Aggravating Factors  look down; sit shifted to the right   Pain Relieving Factors moving around                         [facility] Adult PT Treatment/Exercise - 05/27/15 1538    Neck Exercises: Seated   Neck Retraction 10 reps   Other Seated Exercise review upper trap stretch from last visit   Other Seated Exercise review corner stretch   Neck Exercises: Supine   Neck Retraction 10 reps   Capital Flexion 5 reps   Moist Heat Therapy   Number Minutes Moist Heat 15 Minutes   Moist Heat Location Cervical   Electrical Stimulation   Electrical Stimulation Location cervical   Electrical Stimulation Action IFC   Electrical Stimulation Parameters 7 ma   Electrical Stimulation Goals Pain   Manual Therapy   Manual Therapy Joint mobilization;Neural Stretch   Joint Mobilization PA grade 2/3 C4-C7; lateral glides R/L C4-C7 15 sec each level   Passive ROM right sidebending 3x 20 sec   Manual Traction 3x 30 sec gentle   Neural Stretch left UE 15x  PT Short Term Goals - 05/27/15 1646    PT SHORT TERM GOAL #1   Title pt will be I with basic HEP (06/02/2015)   Time 3   Period Weeks   Status On-going           PT Long Term Goals - 05/27/15 1646    PT LONG TERM GOAL #1   Title upon discharge pt will be I with all exercises given through out therapy (06/23/2015)   Time 6   Period Weeks   Status On-going   PT LONG TERM GOAL #2   Title pt will demonstrate < 1/10 pain during and following liftind and carrying > 5-10# overhead to assist with personal exercise (06/23/2015)   Time 6   Period Weeks   Status On-going   PT LONG TERM GOAL #3   Title She will decrease muscle spasm of the L shoulder musculature to decraease referred pain to the left arm to assist with ADLs (06/23/2015)   Time 6   Period Weeks   Status On-going   PT LONG TERM GOAL #4   Title pt will be able to verbalize and  dmeonstrate tehcniques to reduce risk or shoulder and neck reinjury via psotural awareness, lifting and carrying mechanics, and HEP (06/23/2015)   Time 6   Period Weeks   Status On-going               Plan - 05/27/15 1642    Clinical Impression Statement Patient reports an overall reduction in pain and peripheral symptoms to < 4-5x per day.  She would like to try other inteventions before considering dry needling.  Decreased symptoms with right sidebending, peripheralization with left sidebending.  Therapist closely monitoring symptoms location throughout treatment session.  Good pain relief with modalties.   PT Next Visit Plan postural education; thoracic extension, supine scapular stabilization with band, cervical joint mobs; modalities as needed        Problem List Patient Active Problem List   Diagnosis Date Noted  . Prolapse of anterior vaginal wall 06/03/2014  . Chest pressure 09/16/2013  . GERD (gastroesophageal reflux disease) 09/16/2013  . Pain in limb 03/16/2008  . Type 1 diabetes, controlled, with neuropathy 08/13/2007  . HYPERLIPIDEMIA 08/13/2007  . HYPERTENSION 08/13/2007  . INSOMNIA 08/13/2007  . WEIGHT GAIN 08/13/2007    Alvera Singh 05/27/2015, 4:49 PM  [facility] [address] [place], [place], [address] Phone: [phone]   Fax:  [phone]    Ruben Im, PT 05/27/2015 4:50 PM Phone: [phone] Fax: [phone]

## 2015-06-03 ENCOUNTER — Ambulatory Visit: Payer: Medicare Other | Admitting: Physical Therapy

## 2015-06-10 ENCOUNTER — Ambulatory Visit: Payer: Medicare Other | Admitting: Physical Therapy

## 2015-06-10 DIAGNOSIS — M25512 Pain in left shoulder: Secondary | ICD-10-CM

## 2015-06-10 DIAGNOSIS — M62838 Other muscle spasm: Secondary | ICD-10-CM

## 2015-06-10 NOTE — Patient Instructions (Signed)
Do once or twice a day.  Seated or standing rows and pull downs 8x with yellow band;      Over Head Pull: Narrow Grip       On back, knees bent, feet flat, band across thighs, elbows straight but relaxed. Pull hands apart (start). Keeping elbows straight, bring arms up and over head, hands toward floor. Keep pull steady on band. Hold momentarily. Return slowly, keeping pull steady, back to start. Repeat _8__ times. Band color ___yellow___   Side Pull: Double Arm   On back, knees bent, feet flat. Arms perpendicular to body, shoulder level, elbows straight but relaxed. Pull arms out to sides, elbows straight. Resistance band comes across collarbones, hands toward floor. Hold momentarily. Slowly return to starting position. Repeat _8__ times. Band color __yellow___   Sash   On back, knees bent, feet flat, left hand on left hip, right hand above left. Pull right arm DIAGONALLY (hip to shoulder) across chest. Bring right arm along head toward floor. Hold momentarily. Slowly return to starting position. Repeat _8__ times. Do with left arm. Band color __yellow____   Shoulder Rotation: Double Arm   On back, knees bent, feet flat, elbows tucked at sides, bent 90, hands palms up. Pull hands apart and down toward floor, keeping elbows near sides. Hold momentarily. Slowly return to starting position. Repeat __8_ times. Band color ____yellow__

## 2015-06-10 NOTE — Therapy (Signed)
[facility] [place], [place], [address] Phone: [phone]   Fax:  [phone]  Physical Therapy Treatment/Discharge Summary  Patient Details  Name: [name] MRN: [id number] Date of Birth: 1936/09/21 Referring Provider:  Willey Blade, MD  Encounter Date: 06/10/2015      PT End of Session - 06/10/15 1235    Visit Number 3   Number of Visits 6   Date for PT Re-Evaluation 06/23/15   PT Start Time 0845   PT Stop Time 0928   PT Time Calculation (min) 43 min   Activity Tolerance Patient tolerated treatment well      Past Medical History  Diagnosis Date  . HYPERLIPIDEMIA 08/13/2007  . HYPERTENSION 08/13/2007  . INSOMNIA 08/13/2007  . SVD (spontaneous vaginal delivery)     x 2  . Type 2 DM with ketoacidosis 08/13/2007  . GERD (gastroesophageal reflux disease)     Past Surgical History  Procedure Laterality Date  . Stress cardiolite  02/19/2007  . Hernia repair    . Appendectomy    . Right hand surgery    . Eye surgery      bilateral cataracts  . Dilation and curettage of uterus    . Colonoscopy    . Vaginal hysterectomy N/A 06/03/2014    Procedure: HYSTERECTOMY VAGINAL;  Surgeon: Melina Schools, MD;  Location: [facility];  Service: Gynecology;  Laterality: N/A;  2hrs OR time  . Anterior and posterior repair N/A 06/03/2014    Procedure: ANTERIOR (CYSTOCELE) AND POSTERIOR REPAIR (RECTOCELE);  Surgeon: Melina Schools, MD;  Location: [facility];  Service: Gynecology;  Laterality: N/A;    There were no vitals filed for this visit.  Visit Diagnosis:  Muscle spasm of left shoulder area  Pain in joint, shoulder region, left      Subjective Assessment - 06/10/15 0848    Subjective I'm feeling better.  I've been a few days without it but then it comes back.  Mild numbness intermittently in 4th and 5th fingers.  Not usually at night.  Sleeping better.  States the heat and estim "stirred up her arm last  time."  I can do my exercise and sometimes fine and sometimes brings it on.  Right sidebending brings it on slowly.     Currently in Pain? Yes   Pain Score 1    Pain Location Neck   Pain Orientation Left   Pain Type Chronic pain   Aggravating Factors  looking down or if fall asleep in the chair   Pain Relieving Factors moving around;  green rubbing alcohol            [facility] PT Assessment - 06/10/15 0911    AROM   Cervical Flexion 55   Cervical Extension 53   Cervical - Right Side Bend 30   Cervical - Left Side Bend 30   Cervical - Right Rotation 50   Cervical - Left Rotation 50   Strength   Left Shoulder Flexion 4+/5   Left Shoulder Extension 4+/5   Left Shoulder ABduction 4+/5   Left Shoulder Internal Rotation 4+/5   Left Shoulder External Rotation 4+/5                     [facility] Adult PT Treatment/Exercise - 06/10/15 0001    Shoulder Exercises: Supine   Other Supine Exercises Scapular stabilzation series: yellow band, overhead, ER,sash, HABD 8x each   Shoulder Exercises: Seated   Row Both;10  reps   Shoulder Exercises: Standing   Extension Strengthening;Both;15 reps;Theraband   Theraband Level (Shoulder Extension) Level 1 (Yellow)   Other Standing Exercises --  triceps extension yellow band 10x                PT Education - 07/05/2015 1234    Education provided Yes   Education Details yellow band scapular stabilization exercises in supine, sitting and standing   Person(s) Educated Patient   Methods Explanation;Demonstration;Handout   Comprehension Verbalized understanding;Returned demonstration          PT Short Term Goals - Jul 05, 2015 1241    PT SHORT TERM GOAL #1   Title pt will be I with basic HEP (06/02/2015)   Status Achieved           PT Long Term Goals - 07-05-2015 1241    PT LONG TERM GOAL #1   Title upon discharge pt will be I with all exercises given through out therapy (06/23/2015)   Status Achieved   PT LONG TERM GOAL #2    Title pt will demonstrate < 1/10 pain during and following liftind and carrying > 5-10# overhead to assist with personal exercise (06/23/2015)   Status Partially Met   PT LONG TERM GOAL #3   Title She will decrease muscle spasm of the L shoulder musculature to decraease referred pain to the left arm to assist with ADLs (06/23/2015)   Status Achieved   PT LONG TERM GOAL #4   Title pt will be able to verbalize and dmeonstrate tehcniques to reduce risk or shoulder and neck reinjury via psotural awareness, lifting and carrying mechanics, and HEP (06/23/2015)   Status Achieved               Plan - 07/05/2015 1236    Clinical Impression Statement The patient reports she feels she can self manage now with her HEP.  She states she really likes the theraband exercises given today and is able to perform them without exacerbation of symptoms.  Good improvement with cervical flex and extension ROM.  Left UE strength is good 4+/5.  Discharge from PT at this time per patient request with partial goals met.            G-Codes - 05-Jul-2015 1241    Functional Assessment Tool Used clinical judgement    Functional Limitation Carrying, moving and handling objects   Carrying, Moving and Handling Objects Goal Status [phone]) At least 1 percent but less than 20 percent impaired, limited or restricted   Carrying, Moving and Handling Objects Discharge Status [phone]) At least 1 percent but less than 20 percent impaired, limited or restricted      Problem List Patient Active Problem List   Diagnosis Date Noted  . Prolapse of anterior vaginal wall 06/03/2014  . Chest pressure 09/16/2013  . GERD (gastroesophageal reflux disease) 09/16/2013  . Pain in limb 03/16/2008  . Type 1 diabetes, controlled, with neuropathy 08/13/2007  . HYPERLIPIDEMIA 08/13/2007  . HYPERTENSION 08/13/2007  . INSOMNIA 08/13/2007  . WEIGHT GAIN 08/13/2007    Alvera Singh 05-Jul-2015, 12:43 PM  [facility] [address] [place], [place], [address] Phone: [phone]   Fax:  [phone]   PHYSICAL THERAPY DISCHARGE SUMMARY  Visits from Start of Care: 3  Current functional level related to goals / functional outcomes: See clinical impressions above.     Remaining deficits: See above.  Majority of goals met.     Education /  Equipment: HEP  Plan: Patient agrees to discharge.  Patient goals were partially met. Patient is being discharged due to meeting the stated rehab goals.  ?????  Ruben Im, PT 06/10/2015 12:44 PM Phone: [phone] Fax: [phone]

## 2015-12-17 ENCOUNTER — Other Ambulatory Visit: Payer: Self-pay | Admitting: Internal Medicine

## 2015-12-17 DIAGNOSIS — E2839 Other primary ovarian failure: Secondary | ICD-10-CM

## 2015-12-27 ENCOUNTER — Ambulatory Visit
Admission: RE | Admit: 2015-12-27 | Discharge: 2015-12-27 | Disposition: A | Discharge status: Home / Self Care | Payer: Medicare Other | Source: Ambulatory Visit | Attending: Internal Medicine | Admitting: Internal Medicine

## 2015-12-27 DIAGNOSIS — E2839 Other primary ovarian failure: Secondary | ICD-10-CM

## 2016-01-03 ENCOUNTER — Ambulatory Visit (INDEPENDENT_AMBULATORY_CARE_PROVIDER_SITE_OTHER): Payer: Medicare Other | Admitting: Podiatry

## 2016-01-03 VITALS — BP 126/65 | HR 79 | Resp 16 | Ht 64.0 in | Wt 172.0 lb

## 2016-01-03 DIAGNOSIS — L309 Dermatitis, unspecified: Secondary | ICD-10-CM

## 2016-01-03 MED ORDER — TERBINAFINE HCL 250 MG PO TABS: ORAL_TABLET | ORAL | Status: DC

## 2016-01-03 NOTE — Progress Notes (Signed)
   Subjective:    Patient ID: [name], female    DOB: 19-Aug-1936, 80 y.o.   MRN: [id number]  HPI Patient presents with a bilateral skin condition; itching. Pt stated, "has itching around bottom of all toes"; pt has used vaseline at night with some relief; x2 months.  Pt diabetic type 2; sugar=did not take today; A1C=9.0   Review of Systems  All other systems reviewed and are negative.      Objective:   Physical Exam        Assessment & Plan:

## 2016-01-04 NOTE — Progress Notes (Signed)
Subjective:     Patient ID: [name], female   DOB: [birth year], 80 y.o.   MRN: [id number]  HPI patient states that she has long-term diabetes that's not in great control and has dryness underneath the digits of both feet with itching with no current breakage of skin or   Review of Systems  All other systems reviewed and are negative.      Objective:   Physical Exam  Constitutional: She is oriented to person, place, and time.  Cardiovascular: Intact distal pulses.   Musculoskeletal: Normal range of motion.  Neurological: She is oriented to person, place, and time.  Skin: Skin is warm.  Nursing note and vitals reviewed.  neurovascular status intact muscle strength adequate range of motion within normal limits with patient found have diminishment of sharp Dole vibratory bilateral. I noted patient to have dryness of the skin bilateral with irritation of the interspace but no active drainage or     Assessment:      probable fungal infection localized in nature with dry skin formation    Plan:      H&P conditions reviewed and diabetic education concerning feet was rendered. I placed her on a Lamisil pulse pack to be taken 7  pills of the time for 4 months and also 's cream for dry skin.

## 2016-02-01 ENCOUNTER — Encounter: Payer: Self-pay | Admitting: Internal Medicine

## 2016-02-14 ENCOUNTER — Encounter: Payer: Medicare Other | Admitting: Internal Medicine

## 2016-03-17 ENCOUNTER — Telehealth: Payer: Self-pay | Admitting: *Deleted

## 2016-03-17 NOTE — Telephone Encounter (Signed)
Dr. Henrene Pastor,  Ms. Buckett is a previous pt of Dr. Sharlett Iles. Last colon was 07/2003 which revealed diverticulosis. She is 80 y/o and scheduled for recall colonoscopy 03/29/16. Do you wish to proceed with colonoscopy as scheduled or have patient seen in the office.  Please advise. Thank you,  Olivia Mackie

## 2016-03-20 NOTE — Telephone Encounter (Signed)
I did not see a recall letter. Was she referred? If so by whom and why?

## 2016-03-21 ENCOUNTER — Ambulatory Visit (AMBULATORY_SURGERY_CENTER): Payer: Self-pay

## 2016-03-21 ENCOUNTER — Encounter: Payer: Self-pay | Admitting: Internal Medicine

## 2016-03-21 ENCOUNTER — Telehealth: Payer: Self-pay

## 2016-03-21 VITALS — Ht 63.75 in | Wt 172.4 lb

## 2016-03-21 DIAGNOSIS — Z800 Family history of malignant neoplasm of digestive organs: Secondary | ICD-10-CM

## 2016-03-21 MED ORDER — SUPREP BOWEL PREP KIT 17.5-3.13-1.6 GM/177ML PO SOLN: 1.0000 | Freq: Once | ORAL | Status: DC

## 2016-03-21 NOTE — Telephone Encounter (Signed)
Agree 

## 2016-03-21 NOTE — Telephone Encounter (Signed)
Per [name], scheduler pt called in to schedule her own procedure. No recall was sent to pt nor was there a referral. Per Dr. Henrene Pastor proceed if pt without significant health issues. If concerns, please advise him via office note.

## 2016-03-21 NOTE — Progress Notes (Signed)
No home oxygen No past problems with anesthesia No diet meds No allergies to eggs or soy  No internet

## 2016-03-21 NOTE — Telephone Encounter (Signed)
Dr Henrene Pastor,     Pt is a healthy-looking 80 yo who exercises daily.  Stated Dr Heath Gold told her it was time for her colonoscopy.  Former DP pt.  Had PV today.  Will proceed with procedure as planned.                                                                                                                                                                                                                           Thank you, Angela/PV

## 2016-03-22 ENCOUNTER — Telehealth: Payer: Self-pay | Admitting: Internal Medicine

## 2016-03-22 NOTE — Telephone Encounter (Signed)
Notified patient that a Suprep sample will be available on the 3rd floor. Patient aware and states she will come up tomorrow to pick this up. No further questions at this time.

## 2016-03-29 ENCOUNTER — Encounter: Payer: Self-pay | Admitting: Internal Medicine

## 2016-03-29 ENCOUNTER — Ambulatory Visit (AMBULATORY_SURGERY_CENTER): Payer: Medicare Other | Admitting: Internal Medicine

## 2016-03-29 VITALS — BP 134/65 | HR 67 | Temp 97.8°F | Resp 14 | Ht 63.0 in | Wt 172.0 lb

## 2016-03-29 DIAGNOSIS — Z1211 Encounter for screening for malignant neoplasm of colon: Secondary | ICD-10-CM

## 2016-03-29 DIAGNOSIS — D122 Benign neoplasm of ascending colon: Secondary | ICD-10-CM | POA: Diagnosis not present

## 2016-03-29 DIAGNOSIS — Z800 Family history of malignant neoplasm of digestive organs: Secondary | ICD-10-CM

## 2016-03-29 LAB — GLUCOSE, CAPILLARY
Glucose-Capillary: 212 mg/dL — ABNORMAL HIGH (ref 65–99)
Glucose-Capillary: 186 mg/dL — ABNORMAL HIGH (ref 65–99)

## 2016-03-29 MED ORDER — SODIUM CHLORIDE 0.9 % IV SOLN: 500.0000 mL | INTRAVENOUS | Status: DC

## 2016-03-29 NOTE — Progress Notes (Signed)
A and Ox 3 Report to RN 

## 2016-03-29 NOTE — Progress Notes (Signed)
Called to room to assist during endoscopic procedure.  Patient ID and intended procedure confirmed with present staff. Received instructions for my participation in the procedure from the performing physician.  

## 2016-03-29 NOTE — Patient Instructions (Signed)
YOU HAD AN ENDOSCOPIC PROCEDURE TODAY AT [facility]:   Refer to the procedure report that was given to you for any specific questions about what was found during the examination.  If the procedure report does not answer your questions, please call your gastroenterologist to clarify.  If you requested that your care partner not be given the details of your procedure findings, then the procedure report has been included in a sealed envelope for you to review at your convenience later.  YOU SHOULD EXPECT: Some feelings of bloating in the abdomen. Passage of more gas than usual.  Walking can help get rid of the air that was put into your GI tract during the procedure and reduce the bloating. If you had a lower endoscopy (such as a colonoscopy or flexible sigmoidoscopy) you may notice spotting of blood in your stool or on the toilet paper. If you underwent a bowel prep for your procedure, you may not have a normal bowel movement for a few days.  Please Note:  You might notice some irritation and congestion in your nose or some drainage.  This is from the oxygen used during your procedure.  There is no need for concern and it should clear up in a day or so.  SYMPTOMS TO REPORT IMMEDIATELY:   Following lower endoscopy (colonoscopy or flexible sigmoidoscopy):  Excessive amounts of blood in the stool  Significant tenderness or worsening of abdominal pains  Swelling of the abdomen that is new, acute  Fever of 100F or higher   For urgent or emergent issues, a gastroenterologist can be reached at any hour by calling [phone].   DIET: Your first meal following the procedure should be a small meal and then it is ok to progress to your normal diet. Heavy or fried foods are harder to digest and may make you feel nauseous or bloated.  Likewise, meals heavy in dairy and vegetables can increase bloating.  Drink plenty of fluids but you should avoid alcoholic beverages for 24  hours.  ACTIVITY:  You should plan to take it easy for the rest of today and you should NOT DRIVE or use heavy machinery until tomorrow (because of the sedation medicines used during the test).    FOLLOW UP: Our staff will call the number listed on your records the next business day following your procedure to check on you and address any questions or concerns that you may have regarding the information given to you following your procedure. If we do not reach you, we will leave a message.  However, if you are feeling well and you are not experiencing any problems, there is no need to return our call.  We will assume that you have returned to your regular daily activities without incident.  If any biopsies were taken you will be contacted by phone or by letter within the next 1-3 weeks.  Please call us at [phone] if you have not heard about the biopsies in 3 weeks.    SIGNATURES/CONFIDENTIALITY: You and/or your care partner have signed paperwork which will be entered into your electronic medical record.  These signatures attest to the fact that that the information above on your After Visit Summary has been reviewed and is understood.  Full responsibility of the confidentiality of this discharge information lies with you and/or your care-partner.  Polyp, diverticulosis, and high fiber diet information given.  No repeat colonoscopy due to age.

## 2016-03-29 NOTE — Op Note (Signed)
[facility] Patient Name: [name] Procedure Date: 03/29/2016 7:59 AM MRN: [id number] Endoscopist: Docia Chuck. Henrene Pastor , MD Age: 80 Referring MD:  Date of Birth: [birth year] Gender: Female Procedure:                Colonoscopy, with cold snare polypectomy -1 Indications:              Screening for colorectal malignant neoplasm Medicines:                Monitored Anesthesia Care Procedure:                Pre-Anesthesia Assessment:                           - Prior to the procedure, a History and Physical                            was performed, and patient medications and                            allergies were reviewed. The patient's tolerance of                            previous anesthesia was also reviewed. The risks                            and benefits of the procedure and the sedation                            options and risks were discussed with the patient.                            All questions were answered, and informed consent                            was obtained. Prior Anticoagulants: The patient has                            taken no previous anticoagulant or antiplatelet                            agents. ASA Grade Assessment: II - A patient with                            mild systemic disease. After reviewing the risks                            and benefits, the patient was deemed in                            satisfactory condition to undergo the procedure.                           After obtaining informed consent, the colonoscope  was passed under direct vision. Throughout the                            procedure, the patient's blood pressure, pulse, and                            oxygen saturations were monitored continuously. The                            Model CF-HQ190L [id number]) scope was introduced                            through the anus and advanced to the the cecum,                             identified by appendiceal orifice and ileocecal                            valve. The ileocecal valve, appendiceal orifice,                            and rectum were photographed. The quality of the                            bowel preparation was excellent. The colonoscopy                            was performed without difficulty. The patient                            tolerated the procedure well. The bowel preparation                            used was SUPREP. Scope In: 8:32:49 AM Scope Out: 8:43:28 AM Scope Withdrawal Time: 0 hours 8 minutes 11 seconds  Total Procedure Duration: 0 hours 10 minutes 39 seconds  Findings:                 The digital rectal exam was normal.                           A 5 mm polyp was found in the ascending colon. The                            polyp was sessile. The polyp was removed with a                            cold snare. Resection and retrieval were complete.                           Multiple medium-mouthed diverticula were found in                            the left colon.  The exam was otherwise without abnormality on                            direct and retroflexion views. Complications:            No immediate complications. Estimated blood loss:                            None. Estimated Blood Loss:     Estimated blood loss: none. Impression:               - One 5 mm polyp in the ascending colon, removed                            with a cold snare. Resected and retrieved.                           - Diverticulosis in the left colon.                           - The examination was otherwise normal on direct                            and retroflexion views. Recommendation:           - Resume previous diet.                           - Continue present medications.                           - No repeat colonoscopy due to age. Docia Chuck. Henrene Pastor, MD 03/29/2016 8:53:46 AM This report has been signed  electronically. CC Letter to:             Royetta Crochet. Karlton Lemon, MD

## 2016-03-30 ENCOUNTER — Telehealth: Payer: Self-pay

## 2016-03-30 NOTE — Telephone Encounter (Signed)
  Follow up Call-  Call back number 03/29/2016  Post procedure Call Back phone  # [phone]  Permission to leave phone message Yes     Patient questions:  Do you have a fever, pain , or abdominal swelling? No. Pain Score  0 *  Have you tolerated food without any problems? Yes.    Have you been able to return to your normal activities? Yes.    Do you have any questions about your discharge instructions: Diet   No. Medications  No. Follow up visit  No.  Do you have questions or concerns about your Care? No.  Actions: * If pain score is 4 or above: No action needed, pain <4.

## 2016-04-04 ENCOUNTER — Encounter: Payer: Self-pay | Admitting: Internal Medicine

## 2016-11-28 DIAGNOSIS — E785 Hyperlipidemia, unspecified: Secondary | ICD-10-CM | POA: Diagnosis not present

## 2016-11-28 DIAGNOSIS — M542 Cervicalgia: Secondary | ICD-10-CM | POA: Diagnosis not present

## 2016-11-28 DIAGNOSIS — E1165 Type 2 diabetes mellitus with hyperglycemia: Secondary | ICD-10-CM | POA: Diagnosis not present

## 2016-11-28 DIAGNOSIS — I10 Essential (primary) hypertension: Secondary | ICD-10-CM | POA: Diagnosis not present

## 2017-01-04 DIAGNOSIS — H52223 Regular astigmatism, bilateral: Secondary | ICD-10-CM | POA: Diagnosis not present

## 2017-01-04 DIAGNOSIS — H401131 Primary open-angle glaucoma, bilateral, mild stage: Secondary | ICD-10-CM | POA: Diagnosis not present

## 2017-01-04 DIAGNOSIS — E119 Type 2 diabetes mellitus without complications: Secondary | ICD-10-CM | POA: Diagnosis not present

## 2017-02-08 DIAGNOSIS — E7800 Pure hypercholesterolemia, unspecified: Secondary | ICD-10-CM | POA: Diagnosis not present

## 2017-02-08 DIAGNOSIS — E1140 Type 2 diabetes mellitus with diabetic neuropathy, unspecified: Secondary | ICD-10-CM | POA: Diagnosis not present

## 2017-02-08 DIAGNOSIS — I10 Essential (primary) hypertension: Secondary | ICD-10-CM | POA: Diagnosis not present

## 2017-02-08 DIAGNOSIS — E1165 Type 2 diabetes mellitus with hyperglycemia: Secondary | ICD-10-CM | POA: Diagnosis not present

## 2017-02-09 ENCOUNTER — Telehealth: Payer: Self-pay | Admitting: Internal Medicine

## 2017-02-09 NOTE — Telephone Encounter (Signed)
Received records from [facility] for appointment on 03/12/17 with Dr Debara Pickett.  Records put with Dr Lysbeth Penner schedule for 03/12/17. lp

## 2017-03-08 DIAGNOSIS — E1140 Type 2 diabetes mellitus with diabetic neuropathy, unspecified: Secondary | ICD-10-CM | POA: Diagnosis not present

## 2017-03-08 DIAGNOSIS — E1165 Type 2 diabetes mellitus with hyperglycemia: Secondary | ICD-10-CM | POA: Diagnosis not present

## 2017-03-08 DIAGNOSIS — I10 Essential (primary) hypertension: Secondary | ICD-10-CM | POA: Diagnosis not present

## 2017-03-08 DIAGNOSIS — E7800 Pure hypercholesterolemia, unspecified: Secondary | ICD-10-CM | POA: Diagnosis not present

## 2017-03-12 ENCOUNTER — Encounter: Payer: Self-pay | Admitting: Internal Medicine

## 2017-03-12 ENCOUNTER — Ambulatory Visit (INDEPENDENT_AMBULATORY_CARE_PROVIDER_SITE_OTHER): Payer: Medicare Other | Admitting: Internal Medicine

## 2017-03-12 VITALS — BP 137/69 | HR 62 | Ht 64.0 in | Wt 169.6 lb

## 2017-03-12 DIAGNOSIS — I10 Essential (primary) hypertension: Secondary | ICD-10-CM

## 2017-03-12 DIAGNOSIS — E782 Mixed hyperlipidemia: Secondary | ICD-10-CM

## 2017-03-12 DIAGNOSIS — R002 Palpitations: Secondary | ICD-10-CM | POA: Insufficient documentation

## 2017-03-12 DIAGNOSIS — E1040 Type 1 diabetes mellitus with diabetic neuropathy, unspecified: Secondary | ICD-10-CM

## 2017-03-12 NOTE — Progress Notes (Signed)
OFFICE NOTE  Chief Complaint:  Palpitations  Primary Care Physician: Salena Saner., MD  HPI:  [name] is a pleasant 81 year old female referred to me by Dr. Karlton Lemon. She is a past medical history significant for diabetes on insulin, peripheral neuropathy secondary to diabetes, dyslipidemia and hypertension. Over the past several weeks she's noted some chest pressure which she says starts in the upper abdominal region and is more substernal. She had one episode that occurred when she was walking on a treadmill but improved somewhat after she stopped and rested. She has had several other episodes not associated with exercise however sometimes the symptoms are associated with a pressure feeling that feels like gas. She has had some relief with belching, but that is not consistent. She does report to eat somewhat about high fat diet, was fried foods, but not specifically spicy foods. She has been taking Zantac as needed and he does seem to improve his symptoms somewhat. An EKG was performed at Dr. Roland Earl office which was abnormal and I repeated that today again demonstrated nonspecific T wave changes but otherwise sinus rhythm. There is a family history of heart disease in her father who died of heart attack and kidney failure and her mother had kidney failure presumably due to hypertension and diabetes.  At her last office visit I recommended a nuclear stress test which she underwent on 09/24/2013. This was negative for ischemia with an EF of 87%. I reviewed results with her today and I feel that her symptoms are more likely related to reflux. She continues to have some belching.  03/12/2017  [name] returns today for follow-up. This is considered a new patient visit as her last visit was in November 2014. She was referred back by her endocrinologist Dr. Chalmers Cater. She reports having had a solitary episode of palpitations would last for a few seconds or less than a minute.  This occurred about a month ago but she's had no further episodes. She denies any worsening chest pain or worsening shortness of breath. As previously mentioned she had a stress test which was negative for ischemia in November 2014. She does have a number of coronary risk factors including insulin-dependent diabetes with end organ dysfunction, dyslipidemia and hypertension.  PMHx:  Past Medical History:  Diagnosis Date  . Diabetic neuropathy ([place])   . GERD (gastroesophageal reflux disease)   . HYPERLIPIDEMIA 08/13/2007  . HYPERTENSION 08/13/2007  . INSOMNIA 08/13/2007  . Palpitations   . SVD (spontaneous vaginal delivery)    x 2  . Type 2 DM with ketoacidosis ([place]) 08/13/2007    Past Surgical History:  Procedure Laterality Date  . ANTERIOR AND POSTERIOR REPAIR N/A 06/03/2014   Procedure: ANTERIOR (CYSTOCELE) AND POSTERIOR REPAIR (RECTOCELE);  Surgeon: Melina Schools, MD;  Location: [facility];  Service: Gynecology;  Laterality: N/A;  . APPENDECTOMY    . COLONOSCOPY    . DILATION AND CURETTAGE OF UTERUS    . EYE SURGERY     bilateral cataracts  . HERNIA REPAIR    . right hand surgery    . Stress Cardiolite  02/19/2007  . VAGINAL HYSTERECTOMY N/A 06/03/2014   Procedure: HYSTERECTOMY VAGINAL;  Surgeon: Melina Schools, MD;  Location: [facility];  Service: Gynecology;  Laterality: N/A;  2hrs OR time    FAMHx:  Family History  Problem Relation Age of Onset  . Kidney disease Brother   . Diabetes Brother   . Colon cancer Brother   . Diabetes  Mother   . Diabetes Father   . Heart Problems Father   . Kidney disease Sister   . Colon cancer Sister   . Diabetes Sister   . Multiple sclerosis Child   . Hypertension Child     x2  . Diabetes Child     x2    SOCHx:   reports that she has never smoked. She has never used smokeless tobacco. She reports that she does not drink alcohol or use drugs.  ALLERGIES:  No Known Allergies  ROS: Pertinent items noted in HPI and remainder of  comprehensive ROS otherwise negative.  HOME MEDS: Current Outpatient Prescriptions  Medication Sig Dispense Refill  . aspirin 325 MG EC tablet Take 325 mg by mouth daily.    . B-D ULTRAFINE III SHORT PEN 31G X 8 MM MISC use 3 to 4 TIMES A DAY as directed by prescriber 100 each 6  . bisoprolol (ZEBETA) 10 MG tablet Take 10 mg by mouth daily.    . bisoprolol-hydrochlorothiazide (ZIAC) 2.5-6.25 MG per tablet Take 1 tablet by mouth daily. 30 tablet 0  . brinzolamide (AZOPT) 1 % ophthalmic suspension 1 drop 3 (three) times daily.    . Brinzolamide-Brimonidine (SIMBRINZA) 1-0.2 % SUSP Apply 1 drop to eye See admin instructions. Left eye : 1 drop in the morning Right eye: 1 drop in the morning and 1 drop in the afternoon    . Calcium Carb-Cholecalciferol (CALCIUM 600 + D) 600-200 MG-UNIT TABS Take 2 tablets by mouth daily.    . Calcium Carbonate (CALCIUM 600 PO) Take 1 tablet by mouth 2 (two) times daily.    . Cholecalciferol (VITAMIN D-3) 1000 UNITS CAPS Take 1 capsule by mouth daily.    . clorazepate (TRANXENE) 7.5 MG tablet Take 7.5 mg by mouth 2 (two) times daily as needed for anxiety.    . Cyanocobalamin (VITAMIN B-12 CR PO) Take by mouth. 15108mcg    . gabapentin (NEURONTIN) 100 MG capsule Take 100 mg by mouth daily.     Marland Kitchen HUMALOG KWIKPEN 100 UNIT/ML KiwkPen Inject 12-18 Units into the skin 3 (three) times daily.  0  . Insulin Glargine (BASAGLAR KWIKPEN) 100 UNIT/ML SOPN Inject 30 Units into the skin at bedtime.  0  . insulin lispro protamine-lispro (HUMALOG 50/50 MIX) (50-50) 100 UNIT/ML SUSP injection Inject into the skin 2 (two) times daily before a meal.    . lansoprazole (PREVACID) 15 MG capsule Take 15 mg by mouth daily at 12 noon.    . latanoprost (XALATAN) 0.005 % ophthalmic solution Place 1 drop into both eyes at bedtime.    . Magnesium 200 MG TABS Take 2 tablets by mouth daily.    . metFORMIN (GLUCOPHAGE-XR) 500 MG 24 hr tablet Take 500 mg by mouth 2 (two) times daily.  0  . Multiple  Vitamin (MULTIVITAMIN) capsule Take 1 capsule by mouth daily.    . ONE TOUCH ULTRA TEST test strip TEST twice a day - DIAGNOSIS CODE 250.01 100 each 11  . OVER THE COUNTER MEDICATION Focus Factor    . ranitidine (ZANTAC) 150 MG capsule Take 150 mg by mouth 2 (two) times daily.    . timolol (BETIMOL) 0.5 % ophthalmic solution Apply 1 drop to eye daily.    . vitamin C (ASCORBIC ACID) 500 MG tablet Take 500 mg by mouth daily.     No current facility-administered medications for this visit.     LABS/IMAGING: No results found for this or any previous visit (from the  past 48 hour(s)). No results found.  VITALS: BP 137/69   Pulse 62   Ht 5\' 4"  (1.626 m)   Wt 169 lb 9.6 oz (76.9 kg)   BMI 29.11 kg/m   EXAM: General appearance: alert and no distress Neck: no carotid bruit and no JVD Lungs: clear to auscultation bilaterally Heart: regular rate and rhythm Abdomen: soft, non-tender; bowel sounds normal; no masses,  no organomegaly Extremities: extremities normal, atraumatic, no cyanosis or edema Pulses: 2+ and symmetric Skin: Skin color, texture, turgor normal. No rashes or lesions Neurologic: Grossly normal Psych: Pleasant  EKG: Normal sinus rhythm at 62, nonspecific T wave changes  ASSESSMENT: 1. Palpitations 2. Chest pressure, with and without exertion - negative nuclear stress test 11/14 (resolved) 3. Insulin-dependent diabetes with neuropathy 4. Hypertension-controlled 5. Dyslipidemia 6. Family history of coronary disease 7. Probable GERD  PLAN: 1.   Ms. Casavant has had some minor palpitations which lasted for less than a minute presented no recurrence. She had a negative nuclear stress test in 2014. She does have numerous cardiovascular risk factors but is not having any symptoms. She says she does do some exercise on the treadmill albeit at a very low rate fairly often. She is not interested in further stress testing at this time. I offered a monitor however is difficult  to know how long the monitor her for her very brief episode of solitary palpitations. I would advise that we look for pattern of more frequently occurring palpitations before we consider monitoring.  Thanks for referring her back for evaluation. We'll plan to see her annually or sooner as necessary if her symptoms worsen.  Pixie Casino, MD, [facility] Attending Cardiologist [place] Leiah Giannotti 03/12/2017, 3:04 PM

## 2017-03-12 NOTE — Patient Instructions (Signed)
Your physician wants you to follow-up in: ONE YEAR with Dr. Hilty. You will receive a reminder letter in the mail two months in advance. If you don't receive a letter, please call our office to schedule the follow-up appointment.  

## 2017-04-05 DIAGNOSIS — I10 Essential (primary) hypertension: Secondary | ICD-10-CM | POA: Diagnosis not present

## 2017-04-05 DIAGNOSIS — Z0000 Encounter for general adult medical examination without abnormal findings: Secondary | ICD-10-CM | POA: Diagnosis not present

## 2017-04-05 DIAGNOSIS — E559 Vitamin D deficiency, unspecified: Secondary | ICD-10-CM | POA: Diagnosis not present

## 2017-04-05 DIAGNOSIS — E1165 Type 2 diabetes mellitus with hyperglycemia: Secondary | ICD-10-CM | POA: Diagnosis not present

## 2017-04-05 DIAGNOSIS — E785 Hyperlipidemia, unspecified: Secondary | ICD-10-CM | POA: Diagnosis not present

## 2017-05-29 DIAGNOSIS — H401131 Primary open-angle glaucoma, bilateral, mild stage: Secondary | ICD-10-CM | POA: Diagnosis not present

## 2017-07-03 DIAGNOSIS — E1165 Type 2 diabetes mellitus with hyperglycemia: Secondary | ICD-10-CM | POA: Diagnosis not present

## 2017-07-03 DIAGNOSIS — E7800 Pure hypercholesterolemia, unspecified: Secondary | ICD-10-CM | POA: Diagnosis not present

## 2017-07-10 DIAGNOSIS — E1165 Type 2 diabetes mellitus with hyperglycemia: Secondary | ICD-10-CM | POA: Diagnosis not present

## 2017-07-10 DIAGNOSIS — I10 Essential (primary) hypertension: Secondary | ICD-10-CM | POA: Diagnosis not present

## 2017-07-10 DIAGNOSIS — E7800 Pure hypercholesterolemia, unspecified: Secondary | ICD-10-CM | POA: Diagnosis not present

## 2017-07-10 DIAGNOSIS — E1140 Type 2 diabetes mellitus with diabetic neuropathy, unspecified: Secondary | ICD-10-CM | POA: Diagnosis not present

## 2017-10-26 DIAGNOSIS — H401132 Primary open-angle glaucoma, bilateral, moderate stage: Secondary | ICD-10-CM | POA: Diagnosis not present

## 2018-03-12 ENCOUNTER — Encounter: Payer: Self-pay | Admitting: Internal Medicine

## 2018-03-12 ENCOUNTER — Ambulatory Visit (INDEPENDENT_AMBULATORY_CARE_PROVIDER_SITE_OTHER): Payer: Medicare Other | Admitting: Internal Medicine

## 2018-03-12 VITALS — BP 168/72 | HR 66 | Ht 64.0 in | Wt 172.0 lb

## 2018-03-12 DIAGNOSIS — I10 Essential (primary) hypertension: Secondary | ICD-10-CM

## 2018-03-12 DIAGNOSIS — E1040 Type 1 diabetes mellitus with diabetic neuropathy, unspecified: Secondary | ICD-10-CM

## 2018-03-12 DIAGNOSIS — E782 Mixed hyperlipidemia: Secondary | ICD-10-CM | POA: Diagnosis not present

## 2018-03-12 MED ORDER — ATORVASTATIN CALCIUM 20 MG PO TABS: 20.0000 mg | ORAL_TABLET | Freq: Every day | ORAL | 5 refills | Status: DC

## 2018-03-12 NOTE — Progress Notes (Signed)
OFFICE NOTE  Chief Complaint:  No complaints  Primary Care Physician: Willey Blade, MD  HPI:  [name] is a pleasant 82 year old female referred to me by Dr. Karlton Lemon. She is a past medical history significant for diabetes on insulin, peripheral neuropathy secondary to diabetes, dyslipidemia and hypertension. Over the past several weeks she's noted some chest pressure which she says starts in the upper abdominal region and is more substernal. She had one episode that occurred when she was walking on a treadmill but improved somewhat after she stopped and rested. She has had several other episodes not associated with exercise however sometimes the symptoms are associated with a pressure feeling that feels like gas. She has had some relief with belching, but that is not consistent. She does report to eat somewhat about high fat diet, was fried foods, but not specifically spicy foods. She has been taking Zantac as needed and he does seem to improve his symptoms somewhat. An EKG was performed at Dr. Roland Earl office which was abnormal and I repeated that today again demonstrated nonspecific T wave changes but otherwise sinus rhythm. There is a family history of heart disease in her father who died of heart attack and kidney failure and her mother had kidney failure presumably due to hypertension and diabetes.  At her last office visit I recommended a nuclear stress test which she underwent on 09/24/2013. This was negative for ischemia with an EF of 87%. I reviewed results with her today and I feel that her symptoms are more likely related to reflux. She continues to have some belching.  03/12/2017  [name] returns today for follow-up. This is considered a new patient visit as her last visit was in November 2014. She was referred back by her endocrinologist Dr. Chalmers Cater. She reports having had a solitary episode of palpitations would last for a few seconds or less than a minute.  This occurred about a month ago but she's had no further episodes. She denies any worsening chest pain or worsening shortness of breath. As previously mentioned she had a stress test which was negative for ischemia in November 2014. She does have a number of coronary risk factors including insulin-dependent diabetes with end organ dysfunction, dyslipidemia and hypertension.  03/12/2018  [name] was seen today in follow-up.  She is accompanied by her granddaughter.  Overall she is without complaints.  She occasionally gets some pain that goes down her right leg and starts up at her right hip.  Blood pressure was elevated today 168/70 to have a recheck came at 134/60.  EKG shows sinus rhythm with voltage criteria for LVH at 66.  Labs last in August 2018 showed total cholesterol 160, HDL 48, LDL 84 and triglycerides 140.  PMHx:  Past Medical History:  Diagnosis Date  . Diabetic neuropathy ([place])   . GERD (gastroesophageal reflux disease)   . HYPERLIPIDEMIA 08/13/2007  . HYPERTENSION 08/13/2007  . INSOMNIA 08/13/2007  . Palpitations   . SVD (spontaneous vaginal delivery)    x 2  . Type 2 DM with ketoacidosis ([place]) 08/13/2007    Past Surgical History:  Procedure Laterality Date  . ANTERIOR AND POSTERIOR REPAIR N/A 06/03/2014   Procedure: ANTERIOR (CYSTOCELE) AND POSTERIOR REPAIR (RECTOCELE);  Surgeon: Melina Schools, MD;  Location: [facility];  Service: Gynecology;  Laterality: N/A;  . APPENDECTOMY    . COLONOSCOPY    . DILATION AND CURETTAGE OF UTERUS    . EYE SURGERY  bilateral cataracts  . HERNIA REPAIR    . right hand surgery    . Stress Cardiolite  02/19/2007  . VAGINAL HYSTERECTOMY N/A 06/03/2014   Procedure: HYSTERECTOMY VAGINAL;  Surgeon: Melina Schools, MD;  Location: [facility];  Service: Gynecology;  Laterality: N/A;  2hrs OR time    FAMHx:  Family History  Problem Relation Age of Onset  . Kidney disease Brother   . Diabetes Brother   . Colon cancer Brother   . Diabetes  Mother   . Diabetes Father   . Heart Problems Father   . Kidney disease Sister   . Colon cancer Sister   . Diabetes Sister   . Multiple sclerosis Child   . Hypertension Child        x2  . Diabetes Child        x2    SOCHx:   reports that she has never smoked. She has never used smokeless tobacco. She reports that she does not drink alcohol or use drugs.  ALLERGIES:  No Known Allergies  ROS: Pertinent items noted in HPI and remainder of comprehensive ROS otherwise negative.  HOME MEDS: Current Outpatient Medications  Medication Sig Dispense Refill  . aspirin 325 MG EC tablet Take 325 mg by mouth daily.    . B-D ULTRAFINE III SHORT PEN 31G X 8 MM MISC use 3 to 4 TIMES A DAY as directed by prescriber 100 each 6  . bisoprolol-hydrochlorothiazide (ZIAC) 2.5-6.25 MG per tablet Take 1 tablet by mouth daily. 30 tablet 0  . Calcium Carb-Cholecalciferol (CALCIUM 600 + D) 600-200 MG-UNIT TABS Take 2 tablets by mouth daily.    . Cholecalciferol (VITAMIN D-3) 1000 UNITS CAPS Take 1 capsule by mouth daily.    . clorazepate (TRANXENE) 7.5 MG tablet Take 7.5 mg by mouth 2 (two) times daily as needed for anxiety.    . Cyanocobalamin (VITAMIN B-12 CR PO) Take by mouth. 1561mcg    . dorzolamide-timolol (COSOPT) 22.3-6.8 MG/ML ophthalmic solution Place 1 drop into both eyes daily.  3  . gabapentin (NEURONTIN) 100 MG capsule Take 100 mg by mouth daily.     Marland Kitchen HUMALOG KWIKPEN 100 UNIT/ML KiwkPen Inject 12-18 Units into the skin 3 (three) times daily.  0  . insulin degludec (TRESIBA FLEXTOUCH) 100 UNIT/ML SOPN FlexTouch Pen Inject 30 Units into the skin daily at 10 pm.    . insulin lispro protamine-lispro (HUMALOG 50/50 MIX) (50-50) 100 UNIT/ML SUSP injection Inject into the skin 2 (two) times daily before a meal.    . lansoprazole (PREVACID) 15 MG capsule Take 15 mg by mouth daily at 12 noon.    . latanoprost (XALATAN) 0.005 % ophthalmic solution Place 1 drop into both eyes at bedtime.    . Magnesium  200 MG TABS Take 2 tablets by mouth daily.    . metFORMIN (GLUCOPHAGE-XR) 500 MG 24 hr tablet Take 500 mg by mouth 2 (two) times daily.  0  . Multiple Vitamin (MULTIVITAMIN) capsule Take 1 capsule by mouth daily.    . ONE TOUCH ULTRA TEST test strip TEST twice a day - DIAGNOSIS CODE 250.01 100 each 11  . OVER THE COUNTER MEDICATION Focus Factor    . vitamin C (ASCORBIC ACID) 500 MG tablet Take 500 mg by mouth daily.     No current facility-administered medications for this visit.     LABS/IMAGING: No results found for this or any previous visit (from the past 48 hour(s)). No results found.  VITALS: BP Marland Kitchen)  168/72   Pulse 66   Ht 5\' 4"  (1.626 m)   Wt 172 lb (78 kg)   BMI 29.52 kg/m   EXAM: General appearance: alert and no distress Neck: no carotid bruit and no JVD Lungs: clear to auscultation bilaterally Heart: regular rate and rhythm Abdomen: soft, non-tender; bowel sounds normal; no masses,  no organomegaly Extremities: extremities normal, atraumatic, no cyanosis or edema Pulses: 2+ and symmetric Skin: Skin color, texture, turgor normal. No rashes or lesions Neurologic: Grossly normal Psych: Pleasant  EKG: Normal sinus rhythm at 66, minimal voltage criteria for LVH-personally reviewed  ASSESSMENT: 1. Chest pressure, with and without exertion - negative nuclear stress test 11/14 (resolved) 2. Insulin-dependent diabetes with neuropathy 3. Hypertension-controlled 4. Dyslipidemia 5. Family history of coronary disease 6. Probable GERD  PLAN: 1.   Ms. Branum is doing well without any new chest pain or worsening shortness of breath.  Blood pressure is controlled.  Her cholesterol is elevated with LDL of 84 based on most recent labs.  She is not on any therapy for this.  I recommend starting low-dose atorvastatin 20 mg daily and repeating a fasting lipid profile in 3 months.Marland Kitchen    Up with me annually or sooner as necessary.    Pixie Casino, MD, [place] [facility], [place] Director of the [facility]  [facility] Diplomate of the American Board of Clinical Lipidology Attending Cardiologist  Direct Dial: [phone]  Fax: [phone]  Website:  [email or web address]   Nadean Corwin Hilty 03/12/2018, 2:26 PM

## 2018-03-12 NOTE — Patient Instructions (Signed)
Medication Instructions:   START atorvastatin 20mg  once daily  Labwork:  FASTING labs in three months to check cholesterol   Testing/Procedures:  NONE  Follow-Up:  Your physician wants you to follow-up in: ONE YEAR with Dr. Debara Pickett. You will receive a reminder letter in the mail two months in advance. If you don't receive a letter, please call our office to schedule the follow-up appointment.   If you need a refill on your cardiac medications before your next appointment, please call your pharmacy.  Any Other Special Instructions Will Be Listed Below (If Applicable).

## 2018-04-11 ENCOUNTER — Other Ambulatory Visit: Payer: Self-pay | Admitting: Internal Medicine

## 2018-04-12 ENCOUNTER — Other Ambulatory Visit: Payer: Self-pay | Admitting: Internal Medicine

## 2018-04-12 DIAGNOSIS — M79604 Pain in right leg: Secondary | ICD-10-CM

## 2018-04-18 ENCOUNTER — Ambulatory Visit
Admission: RE | Admit: 2018-04-18 | Discharge: 2018-04-18 | Disposition: A | Discharge status: Home / Self Care | Payer: Medicare Other | Source: Ambulatory Visit | Attending: Internal Medicine | Admitting: Internal Medicine

## 2018-04-18 DIAGNOSIS — M79604 Pain in right leg: Secondary | ICD-10-CM

## 2018-08-02 ENCOUNTER — Ambulatory Visit: Payer: Medicare Other | Admitting: Family Medicine

## 2018-09-19 ENCOUNTER — Emergency Department (HOSPITAL_COMMUNITY): Payer: Medicare Other

## 2018-09-19 ENCOUNTER — Inpatient Hospital Stay (HOSPITAL_COMMUNITY)
Admission: EM | Admit: 2018-09-19 | Discharge: 2018-09-22 | DRG: 872 | Disposition: A | Discharge status: Home / Self Care | Payer: Medicare Other | Attending: Internal Medicine | Admitting: Internal Medicine

## 2018-09-19 DIAGNOSIS — I10 Essential (primary) hypertension: Secondary | ICD-10-CM

## 2018-09-19 DIAGNOSIS — K810 Acute cholecystitis: Secondary | ICD-10-CM | POA: Diagnosis not present

## 2018-09-19 DIAGNOSIS — I129 Hypertensive chronic kidney disease with stage 1 through stage 4 chronic kidney disease, or unspecified chronic kidney disease: Secondary | ICD-10-CM | POA: Diagnosis present

## 2018-09-19 DIAGNOSIS — A4150 Gram-negative sepsis, unspecified: Secondary | ICD-10-CM | POA: Insufficient documentation

## 2018-09-19 DIAGNOSIS — R6520 Severe sepsis without septic shock: Secondary | ICD-10-CM

## 2018-09-19 DIAGNOSIS — Z841 Family history of disorders of kidney and ureter: Secondary | ICD-10-CM

## 2018-09-19 DIAGNOSIS — Z7982 Long term (current) use of aspirin: Secondary | ICD-10-CM | POA: Diagnosis not present

## 2018-09-19 DIAGNOSIS — Z90710 Acquired absence of both cervix and uterus: Secondary | ICD-10-CM | POA: Diagnosis not present

## 2018-09-19 DIAGNOSIS — A4151 Sepsis due to Escherichia coli [E. coli]: Principal | ICD-10-CM | POA: Diagnosis present

## 2018-09-19 DIAGNOSIS — K5730 Diverticulosis of large intestine without perforation or abscess without bleeding: Secondary | ICD-10-CM | POA: Diagnosis present

## 2018-09-19 DIAGNOSIS — N183 Chronic kidney disease, stage 3 unspecified: Secondary | ICD-10-CM

## 2018-09-19 DIAGNOSIS — E1140 Type 2 diabetes mellitus with diabetic neuropathy, unspecified: Secondary | ICD-10-CM

## 2018-09-19 DIAGNOSIS — F419 Anxiety disorder, unspecified: Secondary | ICD-10-CM | POA: Diagnosis present

## 2018-09-19 DIAGNOSIS — N178 Other acute kidney failure: Secondary | ICD-10-CM | POA: Diagnosis not present

## 2018-09-19 DIAGNOSIS — K7200 Acute and subacute hepatic failure without coma: Secondary | ICD-10-CM | POA: Diagnosis not present

## 2018-09-19 DIAGNOSIS — Z8249 Family history of ischemic heart disease and other diseases of the circulatory system: Secondary | ICD-10-CM | POA: Diagnosis not present

## 2018-09-19 DIAGNOSIS — E1022 Type 1 diabetes mellitus with diabetic chronic kidney disease: Secondary | ICD-10-CM | POA: Diagnosis present

## 2018-09-19 DIAGNOSIS — K219 Gastro-esophageal reflux disease without esophagitis: Secondary | ICD-10-CM | POA: Diagnosis present

## 2018-09-19 DIAGNOSIS — E1036 Type 1 diabetes mellitus with diabetic cataract: Secondary | ICD-10-CM | POA: Diagnosis present

## 2018-09-19 DIAGNOSIS — Z800 Family history of malignant neoplasm of digestive organs: Secondary | ICD-10-CM

## 2018-09-19 DIAGNOSIS — A419 Sepsis, unspecified organism: Secondary | ICD-10-CM | POA: Diagnosis present

## 2018-09-19 DIAGNOSIS — Z833 Family history of diabetes mellitus: Secondary | ICD-10-CM

## 2018-09-19 DIAGNOSIS — B9620 Unspecified Escherichia coli [E. coli] as the cause of diseases classified elsewhere: Secondary | ICD-10-CM

## 2018-09-19 DIAGNOSIS — R1011 Right upper quadrant pain: Secondary | ICD-10-CM | POA: Diagnosis present

## 2018-09-19 DIAGNOSIS — E1040 Type 1 diabetes mellitus with diabetic neuropathy, unspecified: Secondary | ICD-10-CM | POA: Diagnosis present

## 2018-09-19 DIAGNOSIS — I5030 Unspecified diastolic (congestive) heart failure: Secondary | ICD-10-CM | POA: Diagnosis not present

## 2018-09-19 DIAGNOSIS — R7881 Bacteremia: Secondary | ICD-10-CM | POA: Diagnosis not present

## 2018-09-19 DIAGNOSIS — K429 Umbilical hernia without obstruction or gangrene: Secondary | ICD-10-CM | POA: Diagnosis present

## 2018-09-19 DIAGNOSIS — E785 Hyperlipidemia, unspecified: Secondary | ICD-10-CM | POA: Diagnosis present

## 2018-09-19 DIAGNOSIS — E1142 Type 2 diabetes mellitus with diabetic polyneuropathy: Secondary | ICD-10-CM | POA: Diagnosis not present

## 2018-09-19 DIAGNOSIS — R509 Fever, unspecified: Secondary | ICD-10-CM

## 2018-09-19 DIAGNOSIS — Z820 Family history of epilepsy and other diseases of the nervous system: Secondary | ICD-10-CM | POA: Diagnosis not present

## 2018-09-19 LAB — CBC WITH DIFFERENTIAL/PLATELET
Abs Immature Granulocytes: 0.02 10*3/uL (ref 0.00–0.07)
Basophils Absolute: 0 10*3/uL (ref 0.0–0.1)
Basophils Relative: 0 %
Eosinophils Absolute: 0 10*3/uL (ref 0.0–0.5)
Eosinophils Relative: 0 %
HCT: 40.2 % (ref 36.0–46.0)
Hemoglobin: 12.7 g/dL (ref 12.0–15.0)
Immature Granulocytes: 0 %
Lymphocytes Relative: 8 %
Lymphs Abs: 0.5 10*3/uL — ABNORMAL LOW (ref 0.7–4.0)
MCH: 27 pg (ref 26.0–34.0)
MCHC: 31.6 g/dL (ref 30.0–36.0)
MCV: 85.4 fL (ref 80.0–100.0)
Monocytes Absolute: 0.2 10*3/uL (ref 0.1–1.0)
Monocytes Relative: 4 %
Neutro Abs: 5.9 10*3/uL (ref 1.7–7.7)
Neutrophils Relative %: 88 %
Platelets: 271 10*3/uL (ref 150–400)
RBC: 4.71 MIL/uL (ref 3.87–5.11)
RDW: 14.4 % (ref 11.5–15.5)
WBC: 6.7 10*3/uL (ref 4.0–10.5)
nRBC: 0 % (ref 0.0–0.2)

## 2018-09-19 LAB — URINALYSIS, ROUTINE W REFLEX MICROSCOPIC
Bilirubin Urine: NEGATIVE
Glucose, UA: 150 mg/dL — AB
Hgb urine dipstick: NEGATIVE
Ketones, ur: NEGATIVE mg/dL
Leukocytes, UA: NEGATIVE
Nitrite: NEGATIVE
Protein, ur: NEGATIVE mg/dL
Specific Gravity, Urine: 1.011 (ref 1.005–1.030)
pH: 7 (ref 5.0–8.0)

## 2018-09-19 LAB — I-STAT CG4 LACTIC ACID, ED
Lactic Acid, Venous: 3.78 mmol/L (ref 0.5–1.9)
Lactic Acid, Venous: 3.23 mmol/L (ref 0.5–1.9)

## 2018-09-19 LAB — COMPREHENSIVE METABOLIC PANEL
ALT: 238 U/L — ABNORMAL HIGH (ref 0–44)
AST: 425 U/L — ABNORMAL HIGH (ref 15–41)
Albumin: 4.1 g/dL (ref 3.5–5.0)
Alkaline Phosphatase: 80 U/L (ref 38–126)
Anion gap: 12 (ref 5–15)
BUN: 16 mg/dL (ref 8–23)
CO2: 25 mmol/L (ref 22–32)
Calcium: 9.6 mg/dL (ref 8.9–10.3)
Chloride: 102 mmol/L (ref 98–111)
Creatinine, Ser: 1.05 mg/dL — ABNORMAL HIGH (ref 0.44–1.00)
GFR calc Af Amer: 57 mL/min — ABNORMAL LOW (ref >60)
GFR calc non Af Amer: 49 mL/min — ABNORMAL LOW (ref >60)
Glucose, Bld: 228 mg/dL — ABNORMAL HIGH (ref 70–99)
Potassium: 3.7 mmol/L (ref 3.5–5.1)
Sodium: 139 mmol/L (ref 135–145)
Total Bilirubin: 1.6 mg/dL — ABNORMAL HIGH (ref 0.3–1.2)
Total Protein: 7.5 g/dL (ref 6.5–8.1)

## 2018-09-19 LAB — LIPASE, BLOOD: Lipase: 35 U/L (ref 11–51)

## 2018-09-19 MED ORDER — SODIUM CHLORIDE 0.9 % IV BOLUS
1000.0000 mL | Freq: Once | INTRAVENOUS | Status: AC
  Administered 2018-09-19: 1000 mL via INTRAVENOUS

## 2018-09-19 MED ORDER — ACETAMINOPHEN 500 MG PO TABS
1000.0000 mg | ORAL_TABLET | Freq: Once | ORAL | Status: AC
  Administered 2018-09-19: 1000 mg via ORAL
  Filled 2018-09-19: qty 2

## 2018-09-19 MED ORDER — VANCOMYCIN HCL IN DEXTROSE 1-5 GM/200ML-% IV SOLN
1000.0000 mg | Freq: Once | INTRAVENOUS | Status: AC
  Administered 2018-09-19: 1000 mg via INTRAVENOUS
  Filled 2018-09-19: qty 200

## 2018-09-19 MED ORDER — PIPERACILLIN-TAZOBACTAM 3.375 G IVPB 30 MIN
3.3750 g | Freq: Once | INTRAVENOUS | Status: AC
  Administered 2018-09-19: 3.375 g via INTRAVENOUS
  Filled 2018-09-19: qty 50

## 2018-09-19 NOTE — ED Provider Notes (Addendum)
[facility] DEPT Provider Note   CSN: [id number] Arrival date & time: 09/19/18  2014     History   Chief Complaint Chief Complaint  Patient presents with  . Fever  . Altered Mental Status    HPI [name] is a 82 y.o. female history of reflux, hyperlipidemia, hypertension, diabetes here presenting with fever, chills, altered mental status, vomiting.  Patient lives at herself but her daughter visits her every day.  She was normal yesterday and called the daughter around 5 PM today with shaking chills.  She was noted to be confused by the daughter who called ambulance to bring her in for evaluation. She also had several episode of vomiting. She was noted to be febrile 102 per EMS.  She denies any neck pain or stiffness.  She denies any cough or dysuria.   The history is provided by the patient.    Past Medical History:  Diagnosis Date  . Diabetic neuropathy ([place])   . GERD (gastroesophageal reflux disease)   . HYPERLIPIDEMIA 08/13/2007  . HYPERTENSION 08/13/2007  . INSOMNIA 08/13/2007  . Palpitations   . SVD (spontaneous vaginal delivery)    x 2  . Type 2 DM with ketoacidosis ([place]) 08/13/2007    Patient Active Problem List   Diagnosis Date Noted  . Palpitations 03/12/2017  . Prolapse of anterior vaginal wall 06/03/2014  . Chest pressure 09/16/2013  . GERD (gastroesophageal reflux disease) 09/16/2013  . Pain in limb 03/16/2008  . Type 1 diabetes, controlled, with neuropathy ([place]) 08/13/2007  . Mixed hyperlipidemia 08/13/2007  . Essential hypertension 08/13/2007  . INSOMNIA 08/13/2007  . WEIGHT GAIN 08/13/2007    Past Surgical History:  Procedure Laterality Date  . ANTERIOR AND POSTERIOR REPAIR N/A 06/03/2014   Procedure: ANTERIOR (CYSTOCELE) AND POSTERIOR REPAIR (RECTOCELE);  Surgeon: Melina Schools, MD;  Location: [facility];  Service: Gynecology;  Laterality: N/A;  . APPENDECTOMY    . COLONOSCOPY    . DILATION AND CURETTAGE OF  UTERUS    . EYE SURGERY     bilateral cataracts  . HERNIA REPAIR    . right hand surgery    . Stress Cardiolite  02/19/2007  . VAGINAL HYSTERECTOMY N/A 06/03/2014   Procedure: HYSTERECTOMY VAGINAL;  Surgeon: Melina Schools, MD;  Location: [facility];  Service: Gynecology;  Laterality: N/A;  2hrs OR time     OB History   None      Home Medications    Prior to Admission medications   Medication Sig Start Date End Date Taking? Authorizing Provider  aspirin 325 MG EC tablet Take 325 mg by mouth daily.   Yes [provider]  bisoprolol-hydrochlorothiazide (ZIAC) 2.5-6.25 MG per tablet Take 1 tablet by mouth daily. 12/05/13  Yes Renato Shin, MD  Calcium Carb-Cholecalciferol (CALCIUM 600 + D) 600-200 MG-UNIT TABS Take 2 tablets by mouth daily.   Yes [provider]  Cholecalciferol (VITAMIN D-3) 1000 UNITS CAPS Take 1 capsule by mouth daily.   Yes [provider]  clorazepate (TRANXENE) 7.5 MG tablet Take 7.5 mg by mouth 2 (two) times daily as needed for anxiety.   Yes [provider]  dorzolamide-timolol (COSOPT) 22.3-6.8 MG/ML ophthalmic solution Place 1 drop into both eyes 2 (two) times daily.  02/25/18  Yes [provider]  gabapentin (NEURONTIN) 100 MG capsule Take 100-200 mg by mouth 2 (two) times daily as needed (pain).    Yes [provider]  HUMALOG KWIKPEN 100 UNIT/ML KiwkPen Inject  12-18 Units into the skin 3 (three) times daily. 01/16/17  Yes [provider]  insulin degludec (TRESIBA FLEXTOUCH) 100 UNIT/ML SOPN FlexTouch Pen Inject 30-45 Units into the skin daily at 10 pm.    Yes [provider]  latanoprost (XALATAN) 0.005 % ophthalmic solution Place 1 drop into both eyes at bedtime.   Yes [provider]  metFORMIN (GLUCOPHAGE-XR) 500 MG 24 hr tablet Take 500 mg by mouth 2 (two) times daily. 02/08/17  Yes [provider]  Multiple Vitamin (MULTIVITAMIN) capsule Take 1 capsule by mouth daily.    Yes [provider]  OVER THE COUNTER MEDICATION Take 1 tablet by mouth daily. Focus Factor    Yes [provider]  vitamin C (ASCORBIC ACID) 500 MG tablet Take 500 mg by mouth 2 (two) times daily.    Yes [provider]  atorvastatin (LIPITOR) 20 MG tablet Take 1 tablet (20 mg total) by mouth daily. Patient not taking: Reported on 09/19/2018 03/12/18   Pixie Casino, MD  B-D ULTRAFINE III SHORT PEN 31G X 8 MM MISC use 3 to 4 TIMES A DAY as directed by prescriber 06/06/13   Renato Shin, MD  ONE [facility] ULTRA TEST test strip TEST twice a day - DIAGNOSIS CODE 250.01 03/24/13   Renato Shin, MD    Family History Family History  Problem Relation Age of Onset  . Kidney disease Brother   . Diabetes Brother   . Colon cancer Brother   . Diabetes Mother   . Diabetes Father   . Heart Problems Father   . Kidney disease Sister   . Colon cancer Sister   . Diabetes Sister   . Multiple sclerosis Child   . Hypertension Child        x2  . Diabetes Child        x2    Social History Social History   Tobacco Use  . Smoking status: Never Smoker  . Smokeless tobacco: Never Used  Substance Use Topics  . Alcohol use: No  . Drug use: No     Allergies   Patient has no known allergies.   Review of Systems Review of Systems  Constitutional: Positive for fever.  Psychiatric/Behavioral: Positive for confusion.  All other systems reviewed and are negative.    Physical Exam Updated Vital Signs BP (!) 120/53   Pulse 90   Temp (!) 104 F (40 C) (Rectal)   Resp (!) 25   Ht 5\' 6"  (1.676 m)   Wt 77.1 kg   SpO2 93%   BMI 27.44 kg/m   Physical Exam  Constitutional:  Uncomfortable, ill appearing   HENT:  Head: Normocephalic.  MM slightly dry   Eyes: Pupils are equal, round, and reactive to light. Conjunctivae and EOM are normal.  Neck: Normal range of motion. Neck supple.  No midline tenderness, no meningeal signs   Cardiovascular: Normal rate, regular  rhythm and normal heart sounds.  Pulmonary/Chest: Effort normal and breath sounds normal. No stridor. No respiratory distress.  Abdominal: Soft.  Mild epigastric tenderness   Musculoskeletal: Normal range of motion.  Neurological: She is alert. No cranial nerve deficit. Coordination normal.  A & O x 2, CN 2- 12 intact, nl strength throughout   Skin: Skin is warm. Capillary refill takes less than 2 seconds.  Psychiatric: She has a normal mood and affect.  Nursing note and vitals reviewed.    ED Treatments / Results  Labs (all labs ordered are listed, but  only abnormal results are displayed) Labs Reviewed  COMPREHENSIVE METABOLIC PANEL - Abnormal; Notable for the following components:      Result Value   Glucose, Bld 228 (*)    Creatinine, Ser 1.05 (*)    AST 425 (*)    ALT 238 (*)    Total Bilirubin 1.6 (*)    GFR calc non Af Amer 49 (*)    GFR calc Af Amer 57 (*)    All other components within normal limits  CBC WITH DIFFERENTIAL/PLATELET - Abnormal; Notable for the following components:   Lymphs Abs 0.5 (*)    All other components within normal limits  I-STAT CG4 LACTIC ACID, ED - Abnormal; Notable for the following components:   Lactic Acid, Venous 3.78 (*)    All other components within normal limits  I-STAT CG4 LACTIC ACID, ED - Abnormal; Notable for the following components:   Lactic Acid, Venous 3.23 (*)    All other components within normal limits  CULTURE, BLOOD (ROUTINE X 2)  CULTURE, BLOOD (ROUTINE X 2)  URINE CULTURE  LIPASE, BLOOD  URINALYSIS, ROUTINE W REFLEX MICROSCOPIC  HEPATITIS PANEL, ACUTE    EKG EKG Interpretation  Date/Time:  Thursday September 19 2018 20:58:03 EST Ventricular Rate:  95 PR Interval:    QRS Duration: 74 QT Interval:  321 QTC Calculation: 404 R Axis:   19 Text Interpretation:  Sinus rhythm Probable left atrial enlargement Low voltage, precordial leads Nonspecific T abnrm, anterolateral leads No significant change since last  tracing Confirmed by Wandra Arthurs [phone]) on 09/19/2018 9:01:03 PM   Radiology Dg Chest 2 View  Result Date: 09/19/2018 CLINICAL DATA:  Fever and cough for 3 days. EXAM: CHEST - 2 VIEW COMPARISON:  09/23/2010 FINDINGS: Shallow inspiration. Heart size and pulmonary vascularity are normal for technique. No airspace disease or consolidation in the lungs. No blunting of costophrenic angles. No pneumothorax. Mediastinal contours appear intact. Degenerative changes in the spine and shoulders. IMPRESSION: No active cardiopulmonary disease. Electronically Signed   By: Lucienne Capers M.D.   On: 09/19/2018 21:14   Ct Head Wo Contrast  Result Date: 09/19/2018 CLINICAL DATA:  Altered mental status. Vomiting. EXAM: CT HEAD WITHOUT CONTRAST TECHNIQUE: Contiguous axial images were obtained from the base of the skull through the vertex without intravenous contrast. COMPARISON:  None. FINDINGS: Brain: Patchy low-attenuation changes in the deep white matter consistent small vessel ischemia. Minimal cerebral atrophic changes particularly for age. No mass effect or midline shift. No abnormal extra-axial fluid collections. Gray-white matter junctions are distinct. Basal cisterns are not effaced. No acute intracranial hemorrhage. Vascular: Intracranial arterial vascular calcifications are present. Skull: Calvarium appears intact. Sinuses/Orbits: Paranasal sinuses and mastoid air cells are clear. Other: None. IMPRESSION: No acute intracranial abnormalities. Mild chronic atrophic and small vessel ischemic changes. Electronically Signed   By: Lucienne Capers M.D.   On: 09/19/2018 21:28   US Abdomen Limited Ruq  Result Date: 09/19/2018 CLINICAL DATA:  Right upper quadrant abdominal pain. EXAM: ULTRASOUND ABDOMEN LIMITED RIGHT UPPER QUADRANT COMPARISON:  None. FINDINGS: Gallbladder: Mild sludge is noted. Gallbladder wall is upper limits of normal at 3 mm. There is no sonographic Murphy sign. Common bile duct: Diameter: 6 mm,  upper limits of normal Liver: The liver is diffusely echogenic. No discrete lesions are present. No significant nodularity is noted. Portal vein is patent on color Doppler imaging with normal direction of blood flow towards the liver. IMPRESSION: 1. Mild sludge in the gallbladder without evidence for cholecystitis. 2.  Hepatic steatosis.  No discrete liver lesions are present. Electronically Signed   By: San Morelle M.D.   On: 09/19/2018 23:10    Procedures Procedures (including critical care time)  CRITICAL CARE Performed by: Wandra Arthurs   Total critical care time: 30 minutes  Critical care time was exclusive of separately billable procedures and treating other patients.  Critical care was necessary to treat or prevent imminent or life-threatening deterioration.  Critical care was time spent personally by me on the following activities: development of treatment plan with patient and/or surrogate as well as nursing, discussions with consultants, evaluation of patient's response to treatment, examination of patient, obtaining history from patient or surrogate, ordering and performing treatments and interventions, ordering and review of laboratory studies, ordering and review of radiographic studies, pulse oximetry and re-evaluation of patient's condition.   Medications Ordered in ED Medications  sodium chloride 0.9 % bolus 1,000 mL (1,000 mLs Intravenous New Bag/Given 09/19/18 2314)  acetaminophen (TYLENOL) tablet 1,000 mg (1,000 mg Oral Given 09/19/18 2134)  sodium chloride 0.9 % bolus 1,000 mL (0 mLs Intravenous Stopped 09/19/18 2135)  vancomycin (VANCOCIN) IVPB 1000 mg/200 mL premix (0 mg Intravenous Stopped 09/19/18 2312)  piperacillin-tazobactam (ZOSYN) IVPB 3.375 g (0 g Intravenous Stopped 09/19/18 2202)     Initial Impression / Assessment and Plan / ED Course  I have reviewed the triage vital signs and the nursing notes.  Pertinent labs & imaging results that were available  during my care of the patient were reviewed by me and considered in my medical decision making (see chart for details).    DEASIAH HAGBERG is a 82 y.o. female here with fever, confusion. No meningeal signs. Febrile 104 in the ED. Not tachycardic or hypotensive. Will initiate sepsis workup. Will get CBC, CMP, lipase, lactate, cultures, CT head, cxr, UA.   11:29 PM LFTs elevated with AST 425, ALT 238, Bili 1.6. I considered acute cholecystitis vs ascending cholangitis vs hepatitis. Added on hepatitis panel. RUQ US performed and showed mild sludge but no acute chole or dilated CBD so cholangitis less likely. I talked to Dr. Johney Maine from surgery and he recommend CT ab/pel and medicine admission for IV abx. Of note, her WBC is normal and lactate is 3.8 initially. CXR clear, CT head nl, mental status improved. Hospitalist to admit for sepsis, elevated LFTs.    Final Clinical Impressions(s) / ED Diagnoses   Final diagnoses:  RUQ pain    ED Discharge Orders    None       Drenda Freeze, MD 09/19/18 2339    Drenda Freeze, MD 09/19/18 2340

## 2018-09-19 NOTE — ED Triage Notes (Signed)
Pt BIB [facility] from home. Pt c/o fever and AMS pt normally is CAOx4 and today is not aware of the year or month or her birthday. Pt had one episode of vomiting around 1800. Pt received 4mg  zofran enroute to ED. Pt denies cough, respiratory issues, pain. Fever 102 via EMS.

## 2018-09-19 NOTE — ED Notes (Signed)
Bed: WA03 Expected date:  Expected time:  Means of arrival:  Comments: 82 yr old fever, AMS

## 2018-09-19 NOTE — ED Notes (Signed)
Ed provider Darl Householder notified patient has a critical lactic acid value of 3.23

## 2018-09-19 NOTE — ED Notes (Signed)
Ed provider Darl Householder notified patient has a critical lactic acid value of 3.78

## 2018-09-20 ENCOUNTER — Inpatient Hospital Stay (HOSPITAL_COMMUNITY): Payer: Medicare Other

## 2018-09-20 ENCOUNTER — Other Ambulatory Visit: Payer: Self-pay

## 2018-09-20 ENCOUNTER — Encounter (HOSPITAL_COMMUNITY): Payer: Self-pay

## 2018-09-20 DIAGNOSIS — K432 Incisional hernia without obstruction or gangrene: Secondary | ICD-10-CM | POA: Insufficient documentation

## 2018-09-20 DIAGNOSIS — R509 Fever, unspecified: Secondary | ICD-10-CM

## 2018-09-20 DIAGNOSIS — A419 Sepsis, unspecified organism: Secondary | ICD-10-CM

## 2018-09-20 DIAGNOSIS — B9620 Unspecified Escherichia coli [E. coli] as the cause of diseases classified elsewhere: Secondary | ICD-10-CM

## 2018-09-20 DIAGNOSIS — E1040 Type 1 diabetes mellitus with diabetic neuropathy, unspecified: Secondary | ICD-10-CM

## 2018-09-20 DIAGNOSIS — K7200 Acute and subacute hepatic failure without coma: Secondary | ICD-10-CM

## 2018-09-20 DIAGNOSIS — I5030 Unspecified diastolic (congestive) heart failure: Secondary | ICD-10-CM

## 2018-09-20 DIAGNOSIS — Z87448 Personal history of other diseases of urinary system: Secondary | ICD-10-CM | POA: Insufficient documentation

## 2018-09-20 DIAGNOSIS — N183 Chronic kidney disease, stage 3 unspecified: Secondary | ICD-10-CM

## 2018-09-20 DIAGNOSIS — E1140 Type 2 diabetes mellitus with diabetic neuropathy, unspecified: Secondary | ICD-10-CM

## 2018-09-20 DIAGNOSIS — A4150 Gram-negative sepsis, unspecified: Secondary | ICD-10-CM | POA: Insufficient documentation

## 2018-09-20 DIAGNOSIS — F419 Anxiety disorder, unspecified: Secondary | ICD-10-CM

## 2018-09-20 DIAGNOSIS — R7881 Bacteremia: Secondary | ICD-10-CM

## 2018-09-20 DIAGNOSIS — K810 Acute cholecystitis: Secondary | ICD-10-CM

## 2018-09-20 DIAGNOSIS — N814 Uterovaginal prolapse, unspecified: Secondary | ICD-10-CM | POA: Insufficient documentation

## 2018-09-20 DIAGNOSIS — R6520 Severe sepsis without septic shock: Secondary | ICD-10-CM

## 2018-09-20 DIAGNOSIS — R1013 Epigastric pain: Secondary | ICD-10-CM | POA: Insufficient documentation

## 2018-09-20 DIAGNOSIS — D649 Anemia, unspecified: Secondary | ICD-10-CM | POA: Insufficient documentation

## 2018-09-20 DIAGNOSIS — R112 Nausea with vomiting, unspecified: Secondary | ICD-10-CM | POA: Insufficient documentation

## 2018-09-20 DIAGNOSIS — E1142 Type 2 diabetes mellitus with diabetic polyneuropathy: Secondary | ICD-10-CM

## 2018-09-20 LAB — CBC
HCT: 37.3 % (ref 36.0–46.0)
Hemoglobin: 11.8 g/dL — ABNORMAL LOW (ref 12.0–15.0)
MCH: 26.9 pg (ref 26.0–34.0)
MCHC: 31.6 g/dL (ref 30.0–36.0)
MCV: 85.2 fL (ref 80.0–100.0)
Platelets: 237 10*3/uL (ref 150–400)
RBC: 4.38 MIL/uL (ref 3.87–5.11)
RDW: 14.5 % (ref 11.5–15.5)
WBC: 9 10*3/uL (ref 4.0–10.5)
nRBC: 0 % (ref 0.0–0.2)

## 2018-09-20 LAB — BLOOD CULTURE ID PANEL (REFLEXED)

## 2018-09-20 LAB — COMPREHENSIVE METABOLIC PANEL WITH GFR
ALT: 196 U/L — ABNORMAL HIGH (ref 0–44)
AST: 193 U/L — ABNORMAL HIGH (ref 15–41)
Albumin: 3.4 g/dL — ABNORMAL LOW (ref 3.5–5.0)
Alkaline Phosphatase: 75 U/L (ref 38–126)
Anion gap: 10 (ref 5–15)
BUN: 13 mg/dL (ref 8–23)
CO2: 23 mmol/L (ref 22–32)
Calcium: 8.7 mg/dL — ABNORMAL LOW (ref 8.9–10.3)
Chloride: 110 mmol/L (ref 98–111)
Creatinine, Ser: 0.97 mg/dL (ref 0.44–1.00)
GFR calc Af Amer: >60 mL/min
GFR calc non Af Amer: 54 mL/min — ABNORMAL LOW
Glucose, Bld: 144 mg/dL — ABNORMAL HIGH (ref 70–99)
Potassium: 3.8 mmol/L (ref 3.5–5.1)
Sodium: 143 mmol/L (ref 135–145)
Total Bilirubin: 2.1 mg/dL — ABNORMAL HIGH (ref 0.3–1.2)
Total Protein: 6.6 g/dL (ref 6.5–8.1)

## 2018-09-20 LAB — GLUCOSE, CAPILLARY
Glucose-Capillary: 143 mg/dL — ABNORMAL HIGH (ref 70–99)
Glucose-Capillary: 132 mg/dL — ABNORMAL HIGH (ref 70–99)
Glucose-Capillary: 104 mg/dL — ABNORMAL HIGH (ref 70–99)
Glucose-Capillary: 168 mg/dL — ABNORMAL HIGH (ref 70–99)
Glucose-Capillary: 262 mg/dL — ABNORMAL HIGH (ref 70–99)

## 2018-09-20 LAB — APTT: aPTT: 35 seconds (ref 24–36)

## 2018-09-20 LAB — HEMOGLOBIN A1C
Hgb A1c MFr Bld: 8.3 % — ABNORMAL HIGH (ref 4.8–5.6)
Mean Plasma Glucose: 191.51 mg/dL

## 2018-09-20 LAB — LACTIC ACID, PLASMA: Lactic Acid, Venous: 1.8 mmol/L (ref 0.5–1.9)

## 2018-09-20 LAB — ECHOCARDIOGRAM COMPLETE
Height: 66 in
Weight: 2910.07 oz

## 2018-09-20 LAB — LACTATE DEHYDROGENASE: LDH: 286 U/L — ABNORMAL HIGH (ref 98–192)

## 2018-09-20 LAB — PROTIME-INR
INR: 1.24
Prothrombin Time: 15.5 seconds — ABNORMAL HIGH (ref 11.4–15.2)

## 2018-09-20 MED ORDER — PIPERACILLIN-TAZOBACTAM 3.375 G IVPB
3.3750 g | Freq: Three times a day (TID) | INTRAVENOUS | Status: DC
  Administered 2018-09-20: 3.375 g via INTRAVENOUS
  Filled 2018-09-20: qty 50

## 2018-09-20 MED ORDER — METOPROLOL TARTRATE 5 MG/5ML IV SOLN: 5.0000 mg | Freq: Four times a day (QID) | INTRAVENOUS | Status: DC | PRN

## 2018-09-20 MED ORDER — FAMOTIDINE IN NACL 20-0.9 MG/50ML-% IV SOLN
20.0000 mg | Freq: Two times a day (BID) | INTRAVENOUS | Status: DC
  Administered 2018-09-20 – 2018-09-21 (×4): 20 mg via INTRAVENOUS
  Filled 2018-09-20 (×5): qty 50

## 2018-09-20 MED ORDER — GABAPENTIN 100 MG PO CAPS
100.0000 mg | ORAL_CAPSULE | Freq: Two times a day (BID) | ORAL | Status: DC | PRN
  Administered 2018-09-22: 200 mg via ORAL
  Filled 2018-09-20: qty 2

## 2018-09-20 MED ORDER — PHENOL 1.4 % MT LIQD
1.0000 | OROMUCOSAL | Status: DC | PRN
  Filled 2018-09-20: qty 177

## 2018-09-20 MED ORDER — METHOCARBAMOL 1000 MG/10ML IJ SOLN
1000.0000 mg | Freq: Four times a day (QID) | INTRAVENOUS | Status: DC | PRN
  Filled 2018-09-20: qty 10

## 2018-09-20 MED ORDER — MAGIC MOUTHWASH
15.0000 mL | Freq: Four times a day (QID) | ORAL | Status: DC | PRN
  Filled 2018-09-20: qty 15

## 2018-09-20 MED ORDER — LIP MEDEX EX OINT
1.0000 "application " | TOPICAL_OINTMENT | Freq: Two times a day (BID) | CUTANEOUS | Status: DC
  Administered 2018-09-20 – 2018-09-21 (×5): 1 via TOPICAL
  Filled 2018-09-20: qty 7

## 2018-09-20 MED ORDER — LATANOPROST 0.005 % OP SOLN
1.0000 [drp] | Freq: Every day | OPHTHALMIC | Status: DC
  Administered 2018-09-20 – 2018-09-21 (×2): 1 [drp] via OPHTHALMIC
  Filled 2018-09-20: qty 2.5

## 2018-09-20 MED ORDER — IOPAMIDOL (ISOVUE-300) INJECTION 61%
100.0000 mL | Freq: Once | INTRAVENOUS | Status: AC | PRN
  Administered 2018-09-20: 100 mL via INTRAVENOUS

## 2018-09-20 MED ORDER — ALUM & MAG HYDROXIDE-SIMETH 200-200-20 MG/5ML PO SUSP: 30.0000 mL | Freq: Four times a day (QID) | ORAL | Status: DC | PRN

## 2018-09-20 MED ORDER — LIP MEDEX EX OINT
TOPICAL_OINTMENT | CUTANEOUS | Status: AC
  Filled 2018-09-20: qty 7

## 2018-09-20 MED ORDER — LACTATED RINGERS IV BOLUS: 1000.0000 mL | Freq: Three times a day (TID) | INTRAVENOUS | Status: DC | PRN

## 2018-09-20 MED ORDER — CLORAZEPATE DIPOTASSIUM 7.5 MG PO TABS
7.5000 mg | ORAL_TABLET | Freq: Two times a day (BID) | ORAL | Status: DC | PRN
  Administered 2018-09-20: 7.5 mg via ORAL
  Filled 2018-09-20: qty 1

## 2018-09-20 MED ORDER — SODIUM CHLORIDE 0.9 % IV SOLN
2.0000 g | INTRAVENOUS | Status: DC
  Administered 2018-09-20 – 2018-09-21 (×2): 2 g via INTRAVENOUS
  Filled 2018-09-20 (×2): qty 2
  Filled 2018-09-20: qty 20

## 2018-09-20 MED ORDER — ACETAMINOPHEN 325 MG PO TABS
325.0000 mg | ORAL_TABLET | Freq: Four times a day (QID) | ORAL | Status: DC | PRN
  Administered 2018-09-20: 650 mg via ORAL
  Filled 2018-09-20: qty 2

## 2018-09-20 MED ORDER — FENTANYL CITRATE (PF) 100 MCG/2ML IJ SOLN: 25.0000 ug | INTRAMUSCULAR | Status: DC | PRN

## 2018-09-20 MED ORDER — TECHNETIUM TC 99M MEBROFENIN IV KIT
7.8000 | PACK | Freq: Once | INTRAVENOUS | Status: AC | PRN
  Administered 2018-09-20: 7.8 via INTRAVENOUS

## 2018-09-20 MED ORDER — ASPIRIN EC 325 MG PO TBEC
325.0000 mg | DELAYED_RELEASE_TABLET | Freq: Every day | ORAL | Status: DC
  Administered 2018-09-20 – 2018-09-22 (×3): 325 mg via ORAL
  Filled 2018-09-20 (×3): qty 1

## 2018-09-20 MED ORDER — HYDRALAZINE HCL 20 MG/ML IJ SOLN: 5.0000 mg | INTRAMUSCULAR | Status: DC | PRN

## 2018-09-20 MED ORDER — SODIUM CHLORIDE (PF) 0.9 % IJ SOLN
INTRAMUSCULAR | Status: AC
  Administered 2018-09-20: 07:00:00
  Filled 2018-09-20: qty 50

## 2018-09-20 MED ORDER — INSULIN ASPART 100 UNIT/ML ~~LOC~~ SOLN
0.0000 [IU] | SUBCUTANEOUS | Status: DC
  Administered 2018-09-20: 2 [IU] via SUBCUTANEOUS
  Administered 2018-09-20: 8 [IU] via SUBCUTANEOUS
  Administered 2018-09-20 – 2018-09-21 (×4): 2 [IU] via SUBCUTANEOUS
  Administered 2018-09-21: 5 [IU] via SUBCUTANEOUS

## 2018-09-20 MED ORDER — INSULIN GLARGINE 100 UNIT/ML ~~LOC~~ SOLN
20.0000 [IU] | Freq: Every day | SUBCUTANEOUS | Status: DC
  Administered 2018-09-20: 20 [IU] via SUBCUTANEOUS
  Filled 2018-09-20: qty 0.2

## 2018-09-20 MED ORDER — PROCHLORPERAZINE EDISYLATE 10 MG/2ML IJ SOLN: 5.0000 mg | INTRAMUSCULAR | Status: DC | PRN

## 2018-09-20 MED ORDER — IOPAMIDOL (ISOVUE-300) INJECTION 61%
INTRAVENOUS | Status: AC
  Administered 2018-09-20: 07:00:00
  Filled 2018-09-20: qty 100

## 2018-09-20 MED ORDER — HYDROCORTISONE 1 % EX CREA
1.0000 "application " | TOPICAL_CREAM | Freq: Three times a day (TID) | CUTANEOUS | Status: DC | PRN
  Filled 2018-09-20: qty 28

## 2018-09-20 MED ORDER — HYDROCORTISONE 2.5 % RE CREA
1.0000 "application " | TOPICAL_CREAM | Freq: Four times a day (QID) | RECTAL | Status: DC | PRN
  Filled 2018-09-20: qty 28.35

## 2018-09-20 MED ORDER — ACETAMINOPHEN 650 MG RE SUPP: 650.0000 mg | Freq: Four times a day (QID) | RECTAL | Status: DC | PRN

## 2018-09-20 MED ORDER — BISACODYL 10 MG RE SUPP
10.0000 mg | Freq: Every day | RECTAL | Status: DC
  Filled 2018-09-20 (×3): qty 1

## 2018-09-20 MED ORDER — VITAMIN C 500 MG PO TABS
500.0000 mg | ORAL_TABLET | Freq: Two times a day (BID) | ORAL | Status: DC
  Administered 2018-09-20 – 2018-09-22 (×5): 500 mg via ORAL
  Filled 2018-09-20 (×5): qty 1

## 2018-09-20 MED ORDER — DORZOLAMIDE HCL-TIMOLOL MAL 2-0.5 % OP SOLN
1.0000 [drp] | Freq: Two times a day (BID) | OPHTHALMIC | Status: DC
  Administered 2018-09-20 – 2018-09-22 (×5): 1 [drp] via OPHTHALMIC
  Filled 2018-09-20: qty 10

## 2018-09-20 MED ORDER — ONDANSETRON HCL 4 MG/2ML IJ SOLN: 4.0000 mg | Freq: Four times a day (QID) | INTRAMUSCULAR | Status: DC | PRN

## 2018-09-20 MED ORDER — SODIUM CHLORIDE 0.9 % IV SOLN
8.0000 mg | Freq: Four times a day (QID) | INTRAVENOUS | Status: DC | PRN
  Filled 2018-09-20: qty 4

## 2018-09-20 MED ORDER — GUAIFENESIN-DM 100-10 MG/5ML PO SYRP: 10.0000 mL | ORAL_SOLUTION | ORAL | Status: DC | PRN

## 2018-09-20 MED ORDER — MENTHOL 3 MG MT LOZG
1.0000 | LOZENGE | OROMUCOSAL | Status: DC | PRN
  Filled 2018-09-20: qty 9

## 2018-09-20 MED ORDER — SODIUM CHLORIDE 0.9 % IV SOLN
INTRAVENOUS | Status: DC | PRN
  Administered 2018-09-20: 16:00:00 via INTRAVENOUS

## 2018-09-20 NOTE — Progress Notes (Signed)
Pharmacy Antibiotic Note  [name] is a 82 y.o. female admitted on 09/19/2018 with Intra-abdominal infection.  Pharmacy has been consulted for zosyn dosing.  Plan: Zosyn 3.375g IV q8h (4 hour infusion).  Height: 5\' 6"  (167.6 cm) Weight: 181 lb 14.1 oz (82.5 kg) IBW/kg (Calculated) : 59.3  Temp (24hrs), Avg:101.2 F (38.4 C), Min:98.1 F (36.7 C), Max:104 F (40 C)  Recent Labs  Lab 09/19/18 2050 09/19/18 2105 09/19/18 2303  WBC 6.7  --   --   CREATININE 1.05*  --   --   LATICACIDVEN  --  3.78* 3.23*    Estimated Creatinine Clearance: 46.3 mL/min (A) (by C-G formula based on SCr of 1.05 mg/dL (H)).    No Known Allergies  Antimicrobials this admission: Zosyn 09/20/2018 >>   Dose adjustments this admission: -  Microbiology results: -  Thank you for allowing pharmacy to be a part of this patient's care.  Nani Skillern Crowford 09/20/2018 3:53 AM

## 2018-09-20 NOTE — Consult Note (Signed)
[name]  May 01, 1936 [id number]  CARE TEAM:  PCP: Beverly Morning, DO  Outpatient Care Team: Patient Care Team: Beverly Morning, DO as PCP - General (Family Medicine)  Inpatient Treatment Team: Treatment Team: Rounding Team: Beverly Knife, MD; Registered Nurse: Beverly Corners, RN; Consulting Physician: Beverly Pace, Md, MD   This patient is a 82 y.o.female who presents today for surgical evaluation at the request of [name] & [name].   Chief complaint / Reason for evaluation: Fever chills abdominal pain.  Possible cholecystitis.  82 year old female.  Insulin requiring diabetic.  Usually lives by herself.  Daughter visits her every day.  Apparently patient was having complaints of fevers and chills and discomfort.  Looks sickly.  Daughter concerned.  Brought to the emergency room.  Concern for possible infection.  Extensive work-up done.  CT scan of head arguing against stroke.    No evidence of any pneumonia or urinary tract infection.  Ultrasound showed sludge in the gallbladder.  Surgical consultation requested case it could be cholecystitis.  Patient has chronic reflux disease.  She had some epigastric discomfort.  I recommended CT scan to rule out perforated ulcer, bowel obstruction, or other etiologies since the ultrasound seemed underwhelming.  CT scan shows some possible gallbladder wall thickening.  Medicine requests consultation.  Patient now on floor.  Denies much abdominal pain right now but complains of epigastric pain.  She notes she tried to eat a hamburger a few days ago and it did not go well.  She has some issues with poorly tolerating greasy foods.  Some occasional nausea and decreased appetite but never emesis until last night.  She is alert and pain-free right now.  Daughter at bedside.  Patient had arterial duplex studies done earlier this year for some right lower extremity discomfort and showed no vasculopathy.  Patient has a history of intermittent chest and  epigastric pain.  Had perfusion study 5 years ago that showed good showed good heart function.  Saw her cardiologist earlier this year.  He suspected reflux.  She had hysterectomy done in 2015.  She had a laparoscopic appendectomy and primary umbilical hernia repair by Beverly. Deon [name] in 2004 who has since retired  [name]  82 y.o. female       Problem List:  Active Problems:   Type 1 diabetes, controlled, with neuropathy (HCC)   GERD (gastroesophageal reflux disease)   Sepsis ([name])   Fever, unspecified   Abdominal pain, epigastric   Nausea with vomiting   Umbilical hernia - reducible   Fever nausea vomiting of uncertain etiology.  Gallbladder suspected of though not that severe on evaluation.  Gastritis could be a possibility as well.  Plan:  Epigastric pain with nausea vomiting and fever of uncertain etiology.  Some of her story sounds suspicious for biliary colic but picture is confusing and that she had a very high fever which usually is viral.  Her CT scan shows minimal gallbladder wall thickening.  There is no leukocytosis but LFTs are up.  Her story is not consistent with definite active cholecystitis at this time.  Would recommend HIDA scan with gallbladder ejection fraction to see if she has cholecystitis or if she has reproduction of symptoms with the protocol.  If that is abnormal, she would benefit from cholecystectomy.  Perhaps during this admission.  Would definitely want medical clearance first.  Discussed with Beverly. Marlowe [name].  She suspects the patient would tolerate surgery fine since the patient usually is  quite active with good independence and no concerning signs by cardiology 5 years and 6 months ago.  Agree with hospital admission.  Fluid resuscitation.  Fine line between avoiding kidney failure and pulmonary failure in an elderly woman.  Had decent cardiac function 5 years ago  Agree with the IV antibiotics for now.  Diabetic control.  Ordered  sliding scale insulin.  Controlled for GERD.  IV Pepcid since Protonix on limited availability and question p.o. intake tolerance giving her intermittent nausea  As needed blood pressure control.  Medical evaluation, stabilization, clearance.  Need to get a sense of what her operative risks are.  Question need to reconsider echocardiogram.  -VTE prophylaxis- SCDs, etc -mobilize as tolerated to help recovery  50 minutes spent in review, evaluation, examination, counseling, and coordination of care.  More than 50% of that time was spent in counseling.  Adin Hector, MD, FACS, MASCRS Gastrointestinal and Minimally Invasive Surgery    [address], [place], [place] [address] [phone] Main / Paging [phone] Fax   09/20/2018      Past Medical History:  Diagnosis Date  . Diabetic neuropathy ([place])   . GERD (gastroesophageal reflux disease)   . HYPERLIPIDEMIA 08/13/2007  . HYPERTENSION 08/13/2007  . INSOMNIA 08/13/2007  . Palpitations   . SVD (spontaneous vaginal delivery)    x 2  . Type 2 DM with ketoacidosis ([place]) 08/13/2007    Past Surgical History:  Procedure Laterality Date  . ANTERIOR AND POSTERIOR REPAIR N/A 06/03/2014   Procedure: ANTERIOR (CYSTOCELE) AND POSTERIOR REPAIR (RECTOCELE);  Surgeon: Melina Schools, MD;  Location: [facility];  Service: Gynecology;  Laterality: N/A;  . APPENDECTOMY    . COLONOSCOPY    . DILATION AND CURETTAGE OF UTERUS    . EYE SURGERY     bilateral cataracts  . HERNIA REPAIR    . right hand surgery    . Stress Cardiolite  02/19/2007  . VAGINAL HYSTERECTOMY N/A 06/03/2014   Procedure: HYSTERECTOMY VAGINAL;  Surgeon: Melina Schools, MD;  Location: [facility];  Service: Gynecology;  Laterality: N/A;  2hrs OR time    Social History   Socioeconomic History  . Marital status: Widowed    Spouse name: Not on file  . Number of children: Not on file  . Years of education: Not on file  . Highest education level: Not on  file  Occupational History  . Not on file  Social Needs  . Financial resource strain: Not very hard  . Food insecurity:    Worry: Never true    Inability: Never true  . Transportation needs:    Medical: No    Non-medical: No  Tobacco Use  . Smoking status: Never Smoker  . Smokeless tobacco: Never Used  Substance and Sexual Activity  . Alcohol use: No  . Drug use: No  . Sexual activity: Yes    Birth control/protection: Post-menopausal  Lifestyle  . Physical activity:    Days per week: Patient refused    Minutes per session: Patient refused  . Stress: Not on file  Relationships  . Social connections:    Talks on phone: Not on file    Gets together: Not on file    Attends religious service: Not on file    Active member of club or organization: Not on file    Attends meetings of clubs or organizations: Not on file    Relationship status: Not on file  . Intimate partner violence:  Fear of current or ex partner: Not on file    Emotionally abused: Not on file    Physically abused: Not on file    Forced sexual activity: Not on file  Other Topics Concern  . Not on file  Social History Narrative  . Not on file    Family History  Problem Relation Age of Onset  . Kidney disease Brother   . Diabetes Brother   . Colon cancer Brother   . Diabetes Mother   . Diabetes Father   . Heart Problems Father   . Kidney disease Sister   . Colon cancer Sister   . Diabetes Sister   . Multiple sclerosis Child   . Hypertension Child        x2  . Diabetes Child        x2    Current Facility-Administered Medications  Medication Dose Route Frequency Provider Last Rate Last Dose  . acetaminophen (TYLENOL) suppository 650 mg  650 mg Rectal Q6H PRN Michael Boston, MD      . acetaminophen (TYLENOL) tablet 325-650 mg  325-650 mg Oral Q6H PRN Michael Boston, MD      . alum & mag hydroxide-simeth (MAALOX/MYLANTA) 200-200-20 MG/5ML suspension 30 mL  30 mL Oral Q6H PRN Michael Boston, MD        . bisacodyl (DULCOLAX) suppository 10 mg  10 mg Rectal Daily Michael Boston, MD      . fentaNYL (SUBLIMAZE) injection 25-50 mcg  25-50 mcg Intravenous Q1H PRN Michael Boston, MD      . guaiFENesin-dextromethorphan (ROBITUSSIN DM) 100-10 MG/5ML syrup 10 mL  10 mL Oral Q4H PRN Michael Boston, MD      . hydrALAZINE (APRESOLINE) injection 5-10 mg  5-10 mg Intravenous Q4H PRN Michael Boston, MD      . hydrocortisone (ANUSOL-HC) 2.5 % rectal cream 1 application  1 application Topical QID PRN Michael Boston, MD      . hydrocortisone cream 1 % 1 application  1 application Topical TID PRN Michael Boston, MD      . insulin aspart (novoLOG) injection 0-15 Units  0-15 Units Subcutaneous Q4H Michael Boston, MD      . iopamidol (ISOVUE-300) 61 % injection           . lactated ringers bolus 1,000 mL  1,000 mL Intravenous TID PRN Michael Boston, MD      . lip balm (CARMEX) ointment 1 application  1 application Topical BID Michael Boston, MD      . magic mouthwash  15 mL Oral QID PRN Michael Boston, MD      . menthol-cetylpyridinium (CEPACOL) lozenge 3 mg  1 lozenge Oral PRN Michael Boston, MD      . methocarbamol (ROBAXIN) 1,000 mg in dextrose 5 % 50 mL IVPB  1,000 mg Intravenous Q6H PRN Michael Boston, MD      . metoprolol tartrate (LOPRESSOR) injection 5 mg  5 mg Intravenous Q6H PRN Michael Boston, MD      . ondansetron [facility]) injection 4 mg  4 mg Intravenous Q6H PRN Michael Boston, MD       Or  . ondansetron (ZOFRAN) 8 mg in sodium chloride 0.9 % 50 mL IVPB  8 mg Intravenous Q6H PRN Michael Boston, MD      . phenol (CHLORASEPTIC) mouth spray 1-2 spray  1-2 spray Mouth/Throat PRN Michael Boston, MD      . prochlorperazine (COMPAZINE) injection 5-10 mg  5-10 mg Intravenous Q4H PRN Michael Boston, MD      .  sodium chloride (PF) 0.9 % injection              No Known Allergies  ROS:   All other systems reviewed & are negative except per HPI or as noted below: Constitutional:  No fevers, chills, sweats.  Weight  stable Eyes:  No vision changes, No discharge HENT:  No sore throats, nasal drainage Lymph: No neck swelling, No bruising easily Pulmonary:  No cough, productive sputum CV: No orthopnea, PND  Patient walks 20 minutes for about 1/2 miles without difficulty.  Goes to the gym many times a week no exertional chest/neck/shoulder/arm pain. GI: No personal nor family history of GI/colon cancer, inflammatory bowel disease, irritable bowel syndrome, allergy such as Celiac Sprue, dietary/dairy problems, colitis, ulcers nor gastritis.  No recent sick contacts/gastroenteritis.  No travel outside the country.  No changes in diet. Renal: No UTIs, No hematuria Genital:  No drainage, bleeding, masses Musculoskeletal: No severe joint pain.  Good ROM major joints Skin:  No sores or lesions.  No rashes Heme/Lymph:  No easy bleeding.  No swollen lymph nodes Neuro: No focal weakness/numbness.  No seizures Psych: No suicidal ideation.  No hallucinations  BP (!) 130/48 (BP Location: Right Arm)   Pulse 83   Temp 98.1 F (36.7 C) (Oral)   Resp 18   Ht '5\' 6"'  (1.676 m)   Wt 82.5 kg   SpO2 98%   BMI 29.36 kg/m   Physical Exam: General: Pt awake/alert/oriented x4 in no major acute distress Eyes: PERRL, normal EOM. Sclera nonicteric Neuro: CN II-XII intact w/o focal sensory/motor deficits. Lymph: No head/neck/groin lymphadenopathy Psych:  No delerium/psychosis/paranoia HENT: Normocephalic, Mucus membranes moist.  No thrush Neck: Supple, No tracheal deviation Chest: No pain.  Good respiratory excursion. CV:  Pulses intact.  Regular rhythm  Abdomen: Soft, Nondistended.  Nontender.  No incarcerated hernias.  No guarding.  No peritonitis.  No Murphy sign.  Periumbilical hernia soft and ultimately reducible.  Contains fat only on CAT scan  Gen:  No inguinal hernias.  No inguinal lymphadenopathy.   Ext:  SCDs BLE.  No significant edema.  No cyanosis Skin: No petechiae / purpurea.  No major  sores Musculoskeletal: No severe joint pain.  Good ROM major joints   Results:   Labs: Results for orders placed or performed during the hospital encounter of 09/19/18 (from the past 48 hour(s))  Lipase, blood     Status: None   Collection Time: 09/19/18  8:46 PM  Result Value Ref Range   Lipase 35 11 - 51 U/L    Comment: Performed at [facility], [place] [address], [place], [place] [address]  Comprehensive metabolic panel     Status: Abnormal   Collection Time: 09/19/18  8:50 PM  Result Value Ref Range   Sodium 139 135 - 145 mmol/L   Potassium 3.7 3.5 - 5.1 mmol/L   Chloride 102 98 - 111 mmol/L   CO2 25 22 - 32 mmol/L   Glucose, Bld 228 (H) 70 - 99 mg/dL   BUN 16 8 - 23 mg/dL   Creatinine, Ser 1.05 (H) 0.44 - 1.00 mg/dL   Calcium 9.6 8.9 - 10.3 mg/dL   Total Protein 7.5 6.5 - 8.1 g/dL   Albumin 4.1 3.5 - 5.0 g/dL   AST 425 (H) 15 - 41 U/L   ALT 238 (H) 0 - 44 U/L   Alkaline Phosphatase 80 38 - 126 U/L   Total Bilirubin 1.6 (H) 0.3 - 1.2 mg/dL  GFR calc non Af Amer 49 (L) >60 mL/min   GFR calc Af Amer 57 (L) >60 mL/min    Comment: (NOTE) The eGFR has been calculated using the CKD EPI equation. This calculation has not been validated in all clinical situations. eGFR's persistently <60 mL/min signify possible Chronic Kidney Disease.    Anion gap 12 5 - 15    Comment: Performed at [facility], [place] [address] [place], [place] [address]  CBC WITH DIFFERENTIAL     Status: Abnormal   Collection Time: 09/19/18  8:50 PM  Result Value Ref Range   WBC 6.7 4.0 - 10.5 K/uL   RBC 4.71 3.87 - 5.11 MIL/uL   Hemoglobin 12.7 12.0 - 15.0 g/dL   HCT 40.2 36.0 - 46.0 %   MCV 85.4 80.0 - 100.0 fL   MCH 27.0 26.0 - 34.0 pg   MCHC 31.6 30.0 - 36.0 g/dL   RDW 14.4 11.5 - 15.5 %   Platelets 271 150 - 400 K/uL   nRBC 0.0 0.0 - 0.2 %   Neutrophils Relative % 88 %   Neutro Abs 5.9 1.7 - 7.7 K/uL   Lymphocytes Relative 8 %   Lymphs Abs 0.5 (L) 0.7 -  4.0 K/uL   Monocytes Relative 4 %   Monocytes Absolute 0.2 0.1 - 1.0 K/uL   Eosinophils Relative 0 %   Eosinophils Absolute 0.0 0.0 - 0.5 K/uL   Basophils Relative 0 %   Basophils Absolute 0.0 0.0 - 0.1 K/uL   Immature Granulocytes 0 %   Abs Immature Granulocytes 0.02 0.00 - 0.07 K/uL    Comment: Performed at [facility], [place] [address] [place], [place] [address]  I-Stat CG4 Lactic Acid, ED  (not at  [facility])     Status: Abnormal   Collection Time: 09/19/18  9:05 PM  Result Value Ref Range   Lactic Acid, Venous 3.78 (HH) 0.5 - 1.9 mmol/L   Comment NOTIFIED PHYSICIAN   I-Stat CG4 Lactic Acid, ED  (not at  [facility])     Status: Abnormal   Collection Time: 09/19/18 11:03 PM  Result Value Ref Range   Lactic Acid, Venous 3.23 (HH) 0.5 - 1.9 mmol/L   Comment NOTIFIED PHYSICIAN   Urinalysis, Routine w reflex microscopic     Status: Abnormal   Collection Time: 09/19/18 11:05 PM  Result Value Ref Range   Color, Urine YELLOW YELLOW   APPearance CLEAR CLEAR   Specific Gravity, Urine 1.011 1.005 - 1.030   pH 7.0 5.0 - 8.0   Glucose, UA 150 (A) NEGATIVE mg/dL   Hgb urine dipstick NEGATIVE NEGATIVE   Bilirubin Urine NEGATIVE NEGATIVE   Ketones, ur NEGATIVE NEGATIVE mg/dL   Protein, ur NEGATIVE NEGATIVE mg/dL   Nitrite NEGATIVE NEGATIVE   Leukocytes, UA NEGATIVE NEGATIVE    Comment: Performed at [facility] [place] [address], [place], [place] [address]    Imaging / Studies: Dg Chest 2 View  Result Date: 09/19/2018 CLINICAL DATA:  Fever and cough for 3 days. EXAM: CHEST - 2 VIEW COMPARISON:  09/23/2010 FINDINGS: Shallow inspiration. Heart size and pulmonary vascularity are normal for technique. No airspace disease or consolidation in the lungs. No blunting of costophrenic angles. No pneumothorax. Mediastinal contours appear intact. Degenerative changes in the spine and shoulders. IMPRESSION: No active cardiopulmonary disease. Electronically Signed   By: Lucienne Capers M.D.   On: 09/19/2018 21:14   Ct Head Wo Contrast  Result Date: 09/19/2018 CLINICAL  DATA:  Altered mental status. Vomiting. EXAM: CT HEAD WITHOUT CONTRAST TECHNIQUE: Contiguous axial images were obtained from the base of the skull through the vertex without intravenous contrast. COMPARISON:  None. FINDINGS: Brain: Patchy low-attenuation changes in the deep white matter consistent small vessel ischemia. Minimal cerebral atrophic changes particularly for age. No mass effect or midline shift. No abnormal extra-axial fluid collections. Gray-white matter junctions are distinct. Basal cisterns are not effaced. No acute intracranial hemorrhage. Vascular: Intracranial arterial vascular calcifications are present. Skull: Calvarium appears intact. Sinuses/Orbits: Paranasal sinuses and mastoid air cells are clear. Other: None. IMPRESSION: No acute intracranial abnormalities. Mild chronic atrophic and small vessel ischemic changes. Electronically Signed   By: Lucienne Capers M.D.   On: 09/19/2018 21:28   Ct Abdomen Pelvis W Contrast  Result Date: 09/20/2018 CLINICAL DATA:  Abdominal pain, acute, generalized, with fever. Elevated lactic acid. Nausea and vomiting. EXAM: CT ABDOMEN AND PELVIS WITH CONTRAST TECHNIQUE: Multidetector CT imaging of the abdomen and pelvis was performed using the standard protocol following bolus administration of intravenous contrast. CONTRAST:  148m ISOVUE-300 IOPAMIDOL (ISOVUE-300) INJECTION 61% COMPARISON:  Abdominal ultrasound 09/19/2018 FINDINGS: Lower chest: The lung bases demonstrate mild dependent atelectasis bilaterally. No focal nodule, mass, or airspace disease is present. The heart size is normal. No significant pleural or pericardial effusion is present. Hepatobiliary: There is diffuse fatty infiltration liver. There is mild prominence of the gallbladder wall. There is slight stranding about the gallbladder. Fluid is present in the gallbladder fossa. The common bile duct  is within normal limits. Pancreas: Unremarkable. No pancreatic ductal dilatation or surrounding inflammatory changes. Spleen: Normal in size without focal abnormality. Adrenals/Urinary Tract: Kidneys and ureters are within normal limits. There is no significant stone or mass lesion. No hydronephrosis is present. The urinary bladder is within normal limits. Stomach/Bowel: The stomach and duodenum are within normal limits. Small bowel is unremarkable. The appendix is surgically absent. The ascending and transverse colon are within normal limits. Diverticular changes are present in the descending and sigmoid colon. Vascular/Lymphatic: Atherosclerotic changes are present in the aorta without aneurysm. No significant retroperitoneal adenopathy is present. Reproductive: Status post hysterectomy. No adnexal masses. Other: A paraumbilical hernia is up to 3 cm wide. This contains fat, but no bowel. Musculoskeletal: Grade 1 degenerative anterolisthesis at L4-5 measures 7 mm. AP alignment is otherwise anatomic. Vertebral body heights are maintained. Moderate bilateral facet hypertrophy is present at L4-5. This contributes to moderate foraminal narrowing in addition to uncovering of a broad-based disc protrusion. IMPRESSION: 1. Mild enhancement and some inflammatory change about the gallbladder. In concert with borderline findings on ultrasound, this raises a higher concerning for developing acute cholecystitis. 2. Hepatic steatosis. 3.  Aortic Atherosclerosis (ICD10-I70.0). 4. Sigmoid diverticulosis without diverticulitis. 5. Paraumbilical hernia measuring over 3 cm contains fat, but no bowel. 6. Degenerative changes of the lumbar spine are most evident at L4-5. Electronically Signed   By: CSan MorelleM.D.   On: 09/20/2018 01:57   [place]domen Limited Ruq  Result Date: 09/19/2018 CLINICAL DATA:  Right upper quadrant abdominal pain. EXAM: ULTRASOUND ABDOMEN LIMITED RIGHT UPPER QUADRANT COMPARISON:  None. FINDINGS:  Gallbladder: Mild sludge is noted. Gallbladder wall is upper limits of normal at 3 mm. There is no sonographic Murphy sign. Common bile duct: Diameter: 6 mm, upper limits of normal Liver: The liver is diffusely echogenic. No discrete lesions are present. No significant nodularity is noted. Portal vein is patent on color Doppler imaging with normal direction of blood flow towards the  liver. IMPRESSION: 1. Mild sludge in the gallbladder without evidence for cholecystitis. 2. Hepatic steatosis.  No discrete liver lesions are present. Electronically Signed   By: San Morelle M.D.   On: 09/19/2018 23:10    Medications / Allergies: per chart  Antibiotics: Anti-infectives (From admission, onward)   Start     Dose/Rate Route Frequency Ordered Stop   09/19/18 2100  vancomycin (VANCOCIN) IVPB 1000 mg/200 mL premix     1,000 mg 200 mL/hr over 60 Minutes Intravenous  Once 09/19/18 2045 09/19/18 2312   09/19/18 2100  piperacillin-tazobactam (ZOSYN) IVPB 3.375 g     3.375 g 100 mL/hr over 30 Minutes Intravenous  Once 09/19/18 2045 09/19/18 2202        Note: Portions of this report may have been transcribed using voice recognition software. Every effort was made to ensure accuracy; however, inadvertent computerized transcription errors may be present.   Any transcriptional errors that result from this process are unintentional.    Adin Hector, MD, FACS, MASCRS Gastrointestinal and Minimally Invasive Surgery    [address], [place], [place] [address] [phone] Main / Paging [phone] Fax   09/20/2018

## 2018-09-20 NOTE — Progress Notes (Signed)
  Echocardiogram 2D Echocardiogram has been performed.  [name] 09/20/2018, 9:57 AM

## 2018-09-20 NOTE — Progress Notes (Signed)
[facility] Progress Note     Subjective: CC: no complaints Patient currently denies abdominal pain, nausea or vomiting. Awaiting HIDA. Wishes to proceed with surgery if needed.   Objective: Vital signs in last 24 hours: Temp:  [98.1 F (36.7 C)-104 F (40 C)] 99.6 F (37.6 C) (11/08 0506) Pulse Rate:  [83-90] 85 (11/08 0506) Resp:  [16-25] 16 (11/08 0506) BP: (110-143)/(44-60) 128/60 (11/08 0506) SpO2:  [93 %-99 %] 94 % (11/08 0506) Weight:  [77.1 kg-82.5 kg] 82.5 kg (11/08 0208) Last BM Date: 09/19/18  Intake/Output from previous day: 11/07 0701 - 11/08 0700 In: 2261.3 [IV Piggyback:2261.3] Out: 1200 [Urine:1200] Intake/Output this shift: No intake/output data recorded.  PE: Gen:  Alert, NAD, pleasant Card:  Regular rate and rhythm Pulm:  Normal effort, clear to auscultation bilaterally Abd: Soft, non-tender, non-distended, bowel sounds present in all 4 quadrants, no HSM, negative Murphy sign Skin: warm and dry, no rashes  Psych: A&Ox3    Assessment/Plan T1DM with neuropathy CKD stage II HTN HLD GERD Anxiety  Sepsis Epigastric pain  - RUQ [place] 11/7: gallbladder sludge without evidence of cholecystitis - CT 11/8: mild pericholecystic inflammation - WBC 9.0, LFTs trending down, Tbili 2.1 from 1.6 - HIDA ordered for today - if positive will plan for laparoscopic cholecystectomy today vs tomorrow  FEN: NPO, IVF VTE: SCDs ID: vanc x1 on 11/7; zosyn 11/7>>   LOS: 1 day    Brigid Re , [facility] 09/20/2018, 8:27 AM Pager: [phone] Consults: [phone] Mon-Fri 7:00 am-4:30 pm Sat-Sun 7:00 am-11:30 am

## 2018-09-20 NOTE — ED Notes (Signed)
ED TO INPATIENT HANDOFF REPORT  Name/Age/Gender [name] 82 y.o. female  Code Status Code Status History    Date Active Date Inactive Code Status Order ID Comments User Context   06/03/2014 1228 06/05/2014 1821 Full Code [id number]  Melina Schools, MD Inpatient      Home/SNF/Other Home  Chief Complaint Fever, AMS  Level of Care/Admitting Diagnosis ED Disposition    ED Disposition Condition [facility] Area: [facility] [[id number]]  Level of Care: Telemetry [5]  Admit to tele based on following criteria: Other see comments  Comments: sepsis  Diagnosis: Sepsis [facility]) [[id number]]  Admitting Physician: Shela Leff [[id number]]  Attending Physician: Shela Leff [[id number]]  Estimated length of stay: past midnight tomorrow  Certification:: I certify this patient will need inpatient services for at least 2 midnights  PT Class (Do Not Modify): Inpatient [101]  PT Acc Code (Do Not Modify): Private [1]       Medical History Past Medical History:  Diagnosis Date  . Diabetic neuropathy ([place])   . GERD (gastroesophageal reflux disease)   . HYPERLIPIDEMIA 08/13/2007  . HYPERTENSION 08/13/2007  . INSOMNIA 08/13/2007  . Palpitations   . SVD (spontaneous vaginal delivery)    x 2  . Type 2 DM with ketoacidosis ([place]) 08/13/2007    Allergies No Known Allergies  IV Location/Drains/Wounds Patient Lines/Drains/Airways Status   Active Line/Drains/Airways    Name:   Placement date:   Placement time:   Site:   Days:   Peripheral IV 09/19/18 Right Forearm   09/19/18    2047    Forearm   1   Incision (Closed) 06/03/14 Perineum Other (Comment)   06/03/14    0715     78          Labs/Imaging Results for orders placed or performed during the hospital encounter of 09/19/18 (from the past 48 hour(s))  Lipase, blood     Status: None   Collection Time: 09/19/18  8:46 PM  Result Value Ref Range   Lipase 35 11 - 51 U/L    Comment:  Performed at [facility], [place] [address] [place], [place] [address]  Comprehensive metabolic panel     Status: Abnormal   Collection Time: 09/19/18  8:50 PM  Result Value Ref Range   Sodium 139 135 - 145 mmol/L   Potassium 3.7 3.5 - 5.1 mmol/L   Chloride 102 98 - 111 mmol/L   CO2 25 22 - 32 mmol/L   Glucose, Bld 228 (H) 70 - 99 mg/dL   BUN 16 8 - 23 mg/dL   Creatinine, Ser 1.05 (H) 0.44 - 1.00 mg/dL   Calcium 9.6 8.9 - 10.3 mg/dL   Total Protein 7.5 6.5 - 8.1 g/dL   Albumin 4.1 3.5 - 5.0 g/dL   AST 425 (H) 15 - 41 U/L   ALT 238 (H) 0 - 44 U/L   Alkaline Phosphatase 80 38 - 126 U/L   Total Bilirubin 1.6 (H) 0.3 - 1.2 mg/dL   GFR calc non Af Amer 49 (L) >60 mL/min   GFR calc Af Amer 57 (L) >60 mL/min    Comment: (NOTE) The eGFR has been calculated using the CKD EPI equation. This calculation has not been validated in all clinical situations. eGFR's persistently <60 mL/min signify possible Chronic Kidney Disease.    Anion gap 12 5 - 15    Comment: Performed at [facility], [address] [place],  [place] [address]  CBC WITH DIFFERENTIAL     Status: Abnormal   Collection Time: 09/19/18  8:50 PM  Result Value Ref Range   WBC 6.7 4.0 - 10.5 K/uL   RBC 4.71 3.87 - 5.11 MIL/uL   Hemoglobin 12.7 12.0 - 15.0 g/dL   HCT 40.2 36.0 - 46.0 %   MCV 85.4 80.0 - 100.0 fL   MCH 27.0 26.0 - 34.0 pg   MCHC 31.6 30.0 - 36.0 g/dL   RDW 14.4 11.5 - 15.5 %   Platelets 271 150 - 400 K/uL   nRBC 0.0 0.0 - 0.2 %   Neutrophils Relative % 88 %   Neutro Abs 5.9 1.7 - 7.7 K/uL   Lymphocytes Relative 8 %   Lymphs Abs 0.5 (L) 0.7 - 4.0 K/uL   Monocytes Relative 4 %   Monocytes Absolute 0.2 0.1 - 1.0 K/uL   Eosinophils Relative 0 %   Eosinophils Absolute 0.0 0.0 - 0.5 K/uL   Basophils Relative 0 %   Basophils Absolute 0.0 0.0 - 0.1 K/uL   Immature Granulocytes 0 %   Abs Immature Granulocytes 0.02 0.00 - 0.07 K/uL    Comment: Performed at [facility], [place] [address] [place], [place] [address]  I-Stat CG4 Lactic Acid, ED  (not at  [facility])     Status: Abnormal   Collection Time: 09/19/18  9:05 PM  Result Value Ref Range   Lactic Acid, Venous 3.78 (HH) 0.5 - 1.9 mmol/L   Comment NOTIFIED PHYSICIAN   I-Stat CG4 Lactic Acid, ED  (not at  [facility])     Status: Abnormal   Collection Time: 09/19/18 11:03 PM  Result Value Ref Range   Lactic Acid, Venous 3.23 (HH) 0.5 - 1.9 mmol/L   Comment NOTIFIED PHYSICIAN   Urinalysis, Routine w reflex microscopic     Status: Abnormal   Collection Time: 09/19/18 11:05 PM  Result Value Ref Range   Color, Urine YELLOW YELLOW   APPearance CLEAR CLEAR   Specific Gravity, Urine 1.011 1.005 - 1.030   pH 7.0 5.0 - 8.0   Glucose, UA 150 (A) NEGATIVE mg/dL   Hgb urine dipstick NEGATIVE NEGATIVE   Bilirubin Urine NEGATIVE NEGATIVE   Ketones, ur NEGATIVE NEGATIVE mg/dL   Protein, ur NEGATIVE NEGATIVE mg/dL   Nitrite NEGATIVE NEGATIVE   Leukocytes, UA NEGATIVE NEGATIVE    Comment: Performed at [facility] [address] [place], [place] [address]   Dg Chest 2 View  Result Date: 09/19/2018 CLINICAL DATA:  Fever and cough for 3 days. EXAM: CHEST - 2 VIEW COMPARISON:  09/23/2010 FINDINGS: Shallow inspiration. Heart size and pulmonary vascularity are normal for technique. No airspace disease or consolidation in the lungs. No blunting of costophrenic angles. No pneumothorax. Mediastinal contours appear intact. Degenerative changes in the spine and shoulders. IMPRESSION: No active cardiopulmonary disease. Electronically Signed   By: Lucienne Capers M.D.   On: 09/19/2018 21:14   Ct Head Wo Contrast  Result Date: 09/19/2018 CLINICAL DATA:  Altered mental status. Vomiting. EXAM: CT HEAD WITHOUT CONTRAST TECHNIQUE: Contiguous axial images were obtained from the base of the skull through the vertex without intravenous contrast. COMPARISON:  None. FINDINGS: Brain: Patchy low-attenuation  changes in the deep white matter consistent small vessel ischemia. Minimal cerebral atrophic changes particularly for age. No mass effect or midline shift. No abnormal extra-axial fluid collections. Gray-white matter junctions are distinct. Basal cisterns are not effaced. No acute intracranial hemorrhage. Vascular: Intracranial arterial  vascular calcifications are present. Skull: Calvarium appears intact. Sinuses/Orbits: Paranasal sinuses and mastoid air cells are clear. Other: None. IMPRESSION: No acute intracranial abnormalities. Mild chronic atrophic and small vessel ischemic changes. Electronically Signed   By: Lucienne Capers M.D.   On: 09/19/2018 21:28   US Abdomen Limited Ruq  Result Date: 09/19/2018 CLINICAL DATA:  Right upper quadrant abdominal pain. EXAM: ULTRASOUND ABDOMEN LIMITED RIGHT UPPER QUADRANT COMPARISON:  None. FINDINGS: Gallbladder: Mild sludge is noted. Gallbladder wall is upper limits of normal at 3 mm. There is no sonographic Murphy sign. Common bile duct: Diameter: 6 mm, upper limits of normal Liver: The liver is diffusely echogenic. No discrete lesions are present. No significant nodularity is noted. Portal vein is patent on color Doppler imaging with normal direction of blood flow towards the liver. IMPRESSION: 1. Mild sludge in the gallbladder without evidence for cholecystitis. 2. Hepatic steatosis.  No discrete liver lesions are present. Electronically Signed   By: San Morelle M.D.   On: 09/19/2018 23:10   EKG Interpretation  Date/Time:  Thursday September 19 2018 20:58:03 EST Ventricular Rate:  95 PR Interval:    QRS Duration: 74 QT Interval:  321 QTC Calculation: 404 R Axis:   19 Text Interpretation:  Sinus rhythm Probable left atrial enlargement Low voltage, precordial leads Nonspecific T abnrm, anterolateral leads No significant change since last tracing Confirmed by Wandra Arthurs [phone]) on 09/19/2018 9:01:03 PM   Pending Labs Unresulted Labs (From  admission, onward)    Start     Ordered   09/19/18 2153  Hepatitis panel, acute  STAT,   STAT     09/19/18 2152   09/19/18 2035  Urine culture  STAT,   STAT     09/19/18 2035   09/19/18 2034  Blood Culture (routine x 2)  BLOOD CULTURE X 2,   STAT     09/19/18 2035          Vitals/Pain Today's Vitals   09/19/18 2315 09/20/18 0015 09/20/18 0024 09/20/18 0047  BP: (!) 120/53 (!) 110/44    Pulse: 90 85    Resp: (!) 25 20    Temp:   98.7 F (37.1 C)   TempSrc:   Oral   SpO2: 93% 96%    Weight:      Height:      PainSc:    0-No pain    Isolation Precautions No active isolations  Medications Medications  iopamidol (ISOVUE-300) 61 % injection (has no administration in time range)  sodium chloride (PF) 0.9 % injection (has no administration in time range)  acetaminophen (TYLENOL) tablet 1,000 mg (1,000 mg Oral Given 09/19/18 2134)  sodium chloride 0.9 % bolus 1,000 mL (0 mLs Intravenous Stopped 09/19/18 2135)  vancomycin (VANCOCIN) IVPB 1000 mg/200 mL premix (0 mg Intravenous Stopped 09/19/18 2312)  piperacillin-tazobactam (ZOSYN) IVPB 3.375 g (0 g Intravenous Stopped 09/19/18 2202)  sodium chloride 0.9 % bolus 1,000 mL (0 mLs Intravenous Stopped 09/19/18 2345)  iopamidol (ISOVUE-300) 61 % injection 100 mL (100 mLs Intravenous Contrast Given 09/20/18 0131)    Mobility walks

## 2018-09-20 NOTE — Progress Notes (Signed)
Inpatient Diabetes Program Recommendations  AACE/ADA: New Consensus Statement on Inpatient Glycemic Control (2015)  Target Ranges:  Prepandial:   less than 140 mg/dL      Peak postprandial:   less than 180 mg/dL (1-2 hours)      Critically ill patients:  140 - 180 mg/dL   Lab Results  Component Value Date   GLUCAP 132 (H) 09/20/2018   HGBA1C 8.3 (H) 09/20/2018   Review of Glycemic Control  Diabetes history: DM 1 Outpatient Diabetes medications: Tresiba 30-45 units, Humalog 12-18 units tid Current orders for Inpatient glycemic control: Novolog 0-15 units Q4 hours  Inpatient Diabetes Program Recommendations:    A1c 8.3% on 11/8  Patient on basal insulin at home and has DM type 1. Spoke with Dr. Aileen Fass about plan of care. Restart a portion of Lantus 20 units to start tonight and monitor trends.  Thanks,  Tama Headings RN, MSN, BC-ADM Inpatient Diabetes Coordinator Team Pager [phone] (8a-5p)

## 2018-09-20 NOTE — Progress Notes (Addendum)
[facility] PROGRESS NOTE    Progress Note  [name]  WYO:[id number] DOB: [birth year] DOA: 09/19/2018 PCP: Janie Morning, DO     Brief Narrative:   [name] is an 82 y.o. female past medical history of hypertension, insulin-dependent diabetes mellitus comes into the hospital for fevers and chills started the day prior to admission accompanied by nausea, she reports left lower quadrant pain that she only had the day prior to admission but none today in the ED was found to be febrile with no leukocytosis, with mild elevation in LFTs and total bilirubin, and abdominal ultrasound showed mild sludge in the gallbladder without evidence of cholecystitis Dr. gross was consulted recommended a CT scan of the abdomen and pelvis that showed gallbladder inflammatory changes and IV antibiotics  Assessment/Plan:   Sepsis due E. Coli bacteremia in the setting of Acute cholecystitis: Abdominal ultrasound showed sludge in the gallbladder with CT scan showing inflammatory changes of the gallbladder. Started empirically on IV vancomycin and Zosyn, will narrowed to only Zosyn. She was fluid resuscitated, blood cultures are pending. LFTs are slowly trending down Surgery recommended a HIDA scan.  Preop evaluation: Clear stress test done in November 2014 that showed no ischemic changes. She is able to perform more than 4 metabolic equivalents. EKG shows no signs of ischemia. Lyndel Safe Perioperative Cardiac Risk is 0.05% Risk and benefits explained to the patient and she would like to proceed with surgery if needed.  Type 1 diabetes, controlled, with neuropathy (HCC) Keep n.p.o. A1c is pending continue sliding scale and CBGs q. hourly.  Chronic kidney disease stage II: Creatinine at baseline.  Essential hypertension: She is on no medications at home.  Anxiety: Continue medications.  Diabetic neuropathy: Continue gabapentin.  Hyperlipidemia: Continue Lipitor.     DVT  prophylaxis: lovenxo Family Communication:daughter Disposition Plan/Barrier to D/C: unable to determine Code Status:  Code Status History    Date Active Date Inactive Code Status Order ID Comments User Context   06/03/2014 1228 06/05/2014 1821 Full Code [id number]  Melina Schools, MD Inpatient        IV Access:    Peripheral IV   Procedures and diagnostic studies:   Dg Chest 2 View  Result Date: 09/19/2018 CLINICAL DATA:  Fever and cough for 3 days. EXAM: CHEST - 2 VIEW COMPARISON:  09/23/2010 FINDINGS: Shallow inspiration. Heart size and pulmonary vascularity are normal for technique. No airspace disease or consolidation in the lungs. No blunting of costophrenic angles. No pneumothorax. Mediastinal contours appear intact. Degenerative changes in the spine and shoulders. IMPRESSION: No active cardiopulmonary disease. Electronically Signed   By: Lucienne Capers M.D.   On: 09/19/2018 21:14   Ct Head Wo Contrast  Result Date: 09/19/2018 CLINICAL DATA:  Altered mental status. Vomiting. EXAM: CT HEAD WITHOUT CONTRAST TECHNIQUE: Contiguous axial images were obtained from the base of the skull through the vertex without intravenous contrast. COMPARISON:  None. FINDINGS: Brain: Patchy low-attenuation changes in the deep white matter consistent small vessel ischemia. Minimal cerebral atrophic changes particularly for age. No mass effect or midline shift. No abnormal extra-axial fluid collections. Gray-white matter junctions are distinct. Basal cisterns are not effaced. No acute intracranial hemorrhage. Vascular: Intracranial arterial vascular calcifications are present. Skull: Calvarium appears intact. Sinuses/Orbits: Paranasal sinuses and mastoid air cells are clear. Other: None. IMPRESSION: No acute intracranial abnormalities. Mild chronic atrophic and small vessel ischemic changes. Electronically Signed   By: Lucienne Capers M.D.   On: 09/19/2018 21:28   Ct Abdomen  Pelvis W Contrast  Result  Date: 09/20/2018 CLINICAL DATA:  Abdominal pain, acute, generalized, with fever. Elevated lactic acid. Nausea and vomiting. EXAM: CT ABDOMEN AND PELVIS WITH CONTRAST TECHNIQUE: Multidetector CT imaging of the abdomen and pelvis was performed using the standard protocol following bolus administration of intravenous contrast. CONTRAST:  19mL ISOVUE-300 IOPAMIDOL (ISOVUE-300) INJECTION 61% COMPARISON:  Abdominal ultrasound 09/19/2018 FINDINGS: Lower chest: The lung bases demonstrate mild dependent atelectasis bilaterally. No focal nodule, mass, or airspace disease is present. The heart size is normal. No significant pleural or pericardial effusion is present. Hepatobiliary: There is diffuse fatty infiltration liver. There is mild prominence of the gallbladder wall. There is slight stranding about the gallbladder. Fluid is present in the gallbladder fossa. The common bile duct is within normal limits. Pancreas: Unremarkable. No pancreatic ductal dilatation or surrounding inflammatory changes. Spleen: Normal in size without focal abnormality. Adrenals/Urinary Tract: Kidneys and ureters are within normal limits. There is no significant stone or mass lesion. No hydronephrosis is present. The urinary bladder is within normal limits. Stomach/Bowel: The stomach and duodenum are within normal limits. Small bowel is unremarkable. The appendix is surgically absent. The ascending and transverse colon are within normal limits. Diverticular changes are present in the descending and sigmoid colon. Vascular/Lymphatic: Atherosclerotic changes are present in the aorta without aneurysm. No significant retroperitoneal adenopathy is present. Reproductive: Status post hysterectomy. No adnexal masses. Other: A paraumbilical hernia is up to 3 cm wide. This contains fat, but no bowel. Musculoskeletal: Grade 1 degenerative anterolisthesis at L4-5 measures 7 mm. AP alignment is otherwise anatomic. Vertebral body heights are maintained.  Moderate bilateral facet hypertrophy is present at L4-5. This contributes to moderate foraminal narrowing in addition to uncovering of a broad-based disc protrusion. IMPRESSION: 1. Mild enhancement and some inflammatory change about the gallbladder. In concert with borderline findings on ultrasound, this raises a higher concerning for developing acute cholecystitis. 2. Hepatic steatosis. 3.  Aortic Atherosclerosis (ICD10-I70.0). 4. Sigmoid diverticulosis without diverticulitis. 5. Paraumbilical hernia measuring over 3 cm contains fat, but no bowel. 6. Degenerative changes of the lumbar spine are most evident at L4-5. Electronically Signed   By: San Morelle M.D.   On: 09/20/2018 01:57   US Abdomen Limited Ruq  Result Date: 09/19/2018 CLINICAL DATA:  Right upper quadrant abdominal pain. EXAM: ULTRASOUND ABDOMEN LIMITED RIGHT UPPER QUADRANT COMPARISON:  None. FINDINGS: Gallbladder: Mild sludge is noted. Gallbladder wall is upper limits of normal at 3 mm. There is no sonographic Murphy sign. Common bile duct: Diameter: 6 mm, upper limits of normal Liver: The liver is diffusely echogenic. No discrete lesions are present. No significant nodularity is noted. Portal vein is patent on color Doppler imaging with normal direction of blood flow towards the liver. IMPRESSION: 1. Mild sludge in the gallbladder without evidence for cholecystitis. 2. Hepatic steatosis.  No discrete liver lesions are present. Electronically Signed   By: San Morelle M.D.   On: 09/19/2018 23:10     Medical Consultants:    None.  Anti-Infectives:   Zosyn  Subjective:    [name] she relates she has epigastric right and left upper quadrant pain, not this morning.  Objective:    Vitals:   09/20/18 0115 09/20/18 0206 09/20/18 0208 09/20/18 0506  BP: (!) 114/45 (!) 130/48  128/60  Pulse: 84 83  85  Resp: 20 18  16   Temp:  98.1 F (36.7 C)  99.6 F (37.6 C)  TempSrc:  Oral  Oral  SpO2: 95% 98%  94%  Weight:   82.5 kg   Height:   5\' 6"  (1.676 m)     Intake/Output Summary (Last 24 hours) at 09/20/2018 0728 Last data filed at 09/20/2018 0700 Gross per 24 hour  Intake 2261.28 ml  Output 1200 ml  Net 1061.28 ml   Filed Weights   09/19/18 2214 09/20/18 0208  Weight: 77.1 kg 82.5 kg    Exam: General exam: In no acute distress. Respiratory system: Good air movement and clear to auscultation. Cardiovascular system: S1 & S2 heard, RRR.  Gastrointestinal system: Abdomen is nondistended, soft and nontender, negative Murphy sign.  Central nervous system: Alert and oriented. No focal neurological deficits. Extremities: No pedal edema. Skin: No rashes, lesions or ulcers Psychiatry: Judgement and insight appear normal. Mood & affect appropriate.    Data Reviewed:    Labs: Basic Metabolic Panel: Recent Labs  Lab 09/19/18 2050 09/20/18 0549  NA 139 143  K 3.7 3.8  CL 102 110  CO2 25 23  GLUCOSE 228* 144*  BUN 16 13  CREATININE 1.05* 0.97  CALCIUM 9.6 8.7*   GFR Estimated Creatinine Clearance: 50.1 mL/min (by C-G formula based on SCr of 0.97 mg/dL). Liver Function Tests: Recent Labs  Lab 09/19/18 2050 09/20/18 0549  AST 425* 193*  ALT 238* 196*  ALKPHOS 80 75  BILITOT 1.6* 2.1*  PROT 7.5 6.6  ALBUMIN 4.1 3.4*   Recent Labs  Lab 09/19/18 2046  LIPASE 35   No results for input(s): AMMONIA in the last 168 hours. Coagulation profile No results for input(s): INR, PROTIME in the last 168 hours.  CBC: Recent Labs  Lab 09/19/18 2050 09/20/18 0549  WBC 6.7 9.0  NEUTROABS 5.9  --   HGB 12.7 11.8*  HCT 40.2 37.3  MCV 85.4 85.2  PLT 271 237   Cardiac Enzymes: No results for input(s): CKTOTAL, CKMB, CKMBINDEX, TROPONINI in the last 168 hours. BNP (last 3 results) No results for input(s): PROBNP in the last 8760 hours. CBG: Recent Labs  Lab 09/20/18 0509  GLUCAP 143*   D-Dimer: No results for input(s): DDIMER in the last 72 hours. Hgb A1c: No  results for input(s): HGBA1C in the last 72 hours. Lipid Profile: No results for input(s): CHOL, HDL, LDLCALC, TRIG, CHOLHDL, LDLDIRECT in the last 72 hours. Thyroid function studies: No results for input(s): TSH, T4TOTAL, T3FREE, THYROIDAB in the last 72 hours.  Invalid input(s): FREET3 Anemia work up: No results for input(s): VITAMINB12, FOLATE, FERRITIN, TIBC, IRON, RETICCTPCT in the last 72 hours. Sepsis Labs: Recent Labs  Lab 09/19/18 2050 09/19/18 2105 09/19/18 2303 09/20/18 0549  WBC 6.7  --   --  9.0  LATICACIDVEN  --  3.78* 3.23*  --    Microbiology No results found for this or any previous visit (from the past 240 hour(s)).   Medications:   . aspirin  325 mg Oral Daily  . bisacodyl  10 mg Rectal Daily  . dorzolamide-timolol  1 drop Both Eyes BID  . insulin aspart  0-15 Units Subcutaneous Q4H  . iopamidol      . latanoprost  1 drop Both Eyes QHS  . lip balm  1 application Topical BID  . sodium chloride (PF)      . vitamin C  500 mg Oral BID   Continuous Infusions: . famotidine (PEPCID) IV 20 mg (09/20/18 0402)  . lactated ringers    . methocarbamol (ROBAXIN) IV    . ondansetron (ZOFRAN) IV    . piperacillin-tazobactam (  ZOSYN)  IV 12.5 mL/hr at 09/20/18 0600     LOS: 1 day   Charlynne Cousins  [facility]   *Please refer to [email or web address], password [email or web address] to get updated schedule on who will round on this patient, as hospitalists switch teams weekly. If 7PM-7AM, please contact night-coverage at [email or web address], password [email or web address] for any overnight needs.  09/20/2018, 7:28 AM

## 2018-09-20 NOTE — H&P (Addendum)
History and Physical    [name] LGX:[id number] DOB: [birth year] DOA: 09/19/2018  PCP: Janie Morning, DO Patient coming from: Home  Chief Complaint: Fever, chills  HPI: [name] is a 82 y.o. female with medical history significant of hypertension, hyperlipidemia, IDDM, CKD 3 presenting to the hospital for evaluation of fevers, chills.  History provided by patient and daughter at bedside.  Patient has been having fevers and chills since yesterday.  She denies having any nausea or vomiting.  Reports having mild left lower quadrant abdominal pain yesterday but no pain today.  Denies having any diarrhea.  States her last bowel movement was 2 days ago.  States her appetite is decreased.  Denies having any dysuria.  Daughter at bedside does not think that the patient is confused.  Patient denies having any chest pain or shortness of breath.  Daughter states patient is very physically active and goes to the gym on a regular basis with her.  She is easily able to climb a flight of stairs without stopping.  ED Course: Febrile to 102 F per EMS.  Temperature 104 F in the ED.  Not tachycardic and blood pressure stable.  No leukocytosis.  LFTs elevated - AST 425, ALT 238, and T bili 1.6.  Alk phos normal.  Lipase normal.  Lactic acid 3.7; improved to 3.2 with IV fluid.  UA not suggestive of infection.  Chest x-ray showing no active cardiopulmonary disease.  CT head showing no acute abnormality.  Abdominal right upper quadrant ultrasound showing mild sludge in the gallbladder without evidence of cholecystitis and hepatic steatosis; no discrete liver lesions. ED physician spoke to Dr. Johney Maine from surgery and he recommend CT ab/pel and medicine admission for IV abx.  Patient started on vancomycin and Zosyn in the ED.  Received 2 L IV fluid boluses.  CT abdomen pelvis concerning for acute cholecystitis.   Review of Systems: As per HPI otherwise 10 point review of systems negative.  Past Medical  History:  Diagnosis Date  . Diabetic neuropathy ([place])   . GERD (gastroesophageal reflux disease)   . HYPERLIPIDEMIA 08/13/2007  . HYPERTENSION 08/13/2007  . INSOMNIA 08/13/2007  . Palpitations   . SVD (spontaneous vaginal delivery)    x 2  . Type 2 DM with ketoacidosis ([place]) 08/13/2007    Past Surgical History:  Procedure Laterality Date  . ANTERIOR AND POSTERIOR REPAIR N/A 06/03/2014   Procedure: ANTERIOR (CYSTOCELE) AND POSTERIOR REPAIR (RECTOCELE);  Surgeon: Melina Schools, MD;  Location: [facility];  Service: Gynecology;  Laterality: N/A;  . COLONOSCOPY    . DILATION AND CURETTAGE OF UTERUS    . EYE SURGERY     bilateral cataracts  . LAPAROSCOPIC APPENDECTOMY  2004   Dr Lindwood Qua  . right hand surgery    . Stress Cardiolite  02/19/2007  . UMBILICAL HERNIA REPAIR  2004   Primary repair at time of appendectomy.  Dr Deon Pilling  . VAGINAL HYSTERECTOMY N/A 06/03/2014   Procedure: HYSTERECTOMY VAGINAL;  Surgeon: Melina Schools, MD;  Location: [facility];  Service: Gynecology;  Laterality: N/A;  2hrs OR time     reports that she has never smoked. She has never used smokeless tobacco. She reports that she does not drink alcohol or use drugs.  No Known Allergies  Family History  Problem Relation Age of Onset  . Kidney disease Brother   . Diabetes Brother   . Colon cancer Brother   . Diabetes Mother   . Diabetes Father   .  Heart Problems Father   . Kidney disease Sister   . Colon cancer Sister   . Diabetes Sister   . Multiple sclerosis Child   . Hypertension Child        x2  . Diabetes Child        x2    Prior to Admission medications   Medication Sig Start Date End Date Taking? Authorizing Provider  aspirin 325 MG EC tablet Take 325 mg by mouth daily.   Yes [provider]  bisoprolol-hydrochlorothiazide (ZIAC) 2.5-6.25 MG per tablet Take 1 tablet by mouth daily. 12/05/13  Yes Renato Shin, MD  Calcium Carb-Cholecalciferol (CALCIUM 600 + D) 600-200 MG-UNIT TABS Take 2  tablets by mouth daily.   Yes [provider]  Cholecalciferol (VITAMIN D-3) 1000 UNITS CAPS Take 1 capsule by mouth daily.   Yes [provider]  clorazepate (TRANXENE) 7.5 MG tablet Take 7.5 mg by mouth 2 (two) times daily as needed for anxiety.   Yes [provider]  dorzolamide-timolol (COSOPT) 22.3-6.8 MG/ML ophthalmic solution Place 1 drop into both eyes 2 (two) times daily.  02/25/18  Yes [provider]  gabapentin (NEURONTIN) 100 MG capsule Take 100-200 mg by mouth 2 (two) times daily as needed (pain).    Yes [provider]  HUMALOG KWIKPEN 100 UNIT/ML KiwkPen Inject 12-18 Units into the skin 3 (three) times daily. 01/16/17  Yes [provider]  insulin degludec (TRESIBA FLEXTOUCH) 100 UNIT/ML SOPN FlexTouch Pen Inject 30-45 Units into the skin daily at 10 pm.    Yes [provider]  latanoprost (XALATAN) 0.005 % ophthalmic solution Place 1 drop into both eyes at bedtime.   Yes [provider]  metFORMIN (GLUCOPHAGE-XR) 500 MG 24 hr tablet Take 500 mg by mouth 2 (two) times daily. 02/08/17  Yes [provider]  Multiple Vitamin (MULTIVITAMIN) capsule Take 1 capsule by mouth daily.   Yes [provider]  OVER THE COUNTER MEDICATION Take 1 tablet by mouth daily. Focus Factor    Yes [provider]  vitamin C (ASCORBIC ACID) 500 MG tablet Take 500 mg by mouth 2 (two) times daily.    Yes [provider]  atorvastatin (LIPITOR) 20 MG tablet Take 1 tablet (20 mg total) by mouth daily. Patient not taking: Reported on 09/19/2018 03/12/18   Pixie Casino, MD  B-D ULTRAFINE III SHORT PEN 31G X 8 MM MISC use 3 to 4 TIMES A DAY as directed by prescriber 06/06/13   Renato Shin, MD  ONE [facility] ULTRA TEST test strip TEST twice a day - DIAGNOSIS CODE 250.01 03/24/13   Renato Shin, MD    Physical Exam: Vitals:   09/20/18 0024 09/20/18 0115 09/20/18 0206 09/20/18 0208  BP:  (!) 114/45 (!) 130/48     Pulse:  84 83   Resp:  20 18   Temp: 98.7 F (37.1 C)  98.1 F (36.7 C)   TempSrc: Oral  Oral   SpO2:  95% 98%   Weight:    82.5 kg  Height:    '5\' 6"'  (1.676 m)    Physical Exam  Constitutional: She is oriented to person, place, and time. She appears well-developed and well-nourished. No distress.  HENT:  Head: Normocephalic.  Mouth/Throat: Oropharynx is clear and moist.  Eyes: Right eye exhibits no discharge. Left eye exhibits no discharge.  Neck: Neck supple. No tracheal deviation present.  Cardiovascular: Normal rate, regular rhythm and intact distal pulses.  Pulmonary/Chest: Effort normal and  breath sounds normal. No respiratory distress. She has no wheezes. She has no rales.  Abdominal: Soft. Bowel sounds are normal. She exhibits no distension. There is no tenderness. There is no guarding.  Musculoskeletal: She exhibits no edema.  Neurological: She is alert and oriented to person, place, and time.  Skin: Skin is warm and dry. She is not diaphoretic.  Psychiatric: She has a normal mood and affect. Her behavior is normal.     Labs on Admission: I have personally reviewed following labs and imaging studies  CBC: Recent Labs  Lab 09/19/18 2050  WBC 6.7  NEUTROABS 5.9  HGB 12.7  HCT 40.2  MCV 85.4  PLT 431   Basic Metabolic Panel: Recent Labs  Lab 09/19/18 2050  NA 139  K 3.7  CL 102  CO2 25  GLUCOSE 228*  BUN 16  CREATININE 1.05*  CALCIUM 9.6   GFR: Estimated Creatinine Clearance: 46.3 mL/min (A) (by C-G formula based on SCr of 1.05 mg/dL (H)). Liver Function Tests: Recent Labs  Lab 09/19/18 2050  AST 425*  ALT 238*  ALKPHOS 80  BILITOT 1.6*  PROT 7.5  ALBUMIN 4.1   Recent Labs  Lab 09/19/18 2046  LIPASE 35   No results for input(s): AMMONIA in the last 168 hours. Coagulation Profile: No results for input(s): INR, PROTIME in the last 168 hours. Cardiac Enzymes: No results for input(s): CKTOTAL, CKMB, CKMBINDEX, TROPONINI in the last 168  hours. BNP (last 3 results) No results for input(s): PROBNP in the last 8760 hours. HbA1C: No results for input(s): HGBA1C in the last 72 hours. CBG: No results for input(s): GLUCAP in the last 168 hours. Lipid Profile: No results for input(s): CHOL, HDL, LDLCALC, TRIG, CHOLHDL, LDLDIRECT in the last 72 hours. Thyroid Function Tests: No results for input(s): TSH, T4TOTAL, FREET4, T3FREE, THYROIDAB in the last 72 hours. Anemia Panel: No results for input(s): VITAMINB12, FOLATE, FERRITIN, TIBC, IRON, RETICCTPCT in the last 72 hours. Urine analysis:    Component Value Date/Time   COLORURINE YELLOW 09/19/2018 2305   APPEARANCEUR CLEAR 09/19/2018 2305   LABSPEC 1.011 09/19/2018 2305   PHURINE 7.0 09/19/2018 2305   GLUCOSEU 150 (A) 09/19/2018 2305   HGBUR NEGATIVE 09/19/2018 2305   BILIRUBINUR NEGATIVE 09/19/2018 2305   KETONESUR NEGATIVE 09/19/2018 2305   PROTEINUR NEGATIVE 09/19/2018 2305   UROBILINOGEN 0.2 05/28/2014 0850   NITRITE NEGATIVE 09/19/2018 2305   LEUKOCYTESUR NEGATIVE 09/19/2018 2305    Radiological Exams on Admission: Dg Chest 2 View  Result Date: 09/19/2018 CLINICAL DATA:  Fever and cough for 3 days. EXAM: CHEST - 2 VIEW COMPARISON:  09/23/2010 FINDINGS: Shallow inspiration. Heart size and pulmonary vascularity are normal for technique. No airspace disease or consolidation in the lungs. No blunting of costophrenic angles. No pneumothorax. Mediastinal contours appear intact. Degenerative changes in the spine and shoulders. IMPRESSION: No active cardiopulmonary disease. Electronically Signed   By: Lucienne Capers M.D.   On: 09/19/2018 21:14   Ct Head Wo Contrast  Result Date: 09/19/2018 CLINICAL DATA:  Altered mental status. Vomiting. EXAM: CT HEAD WITHOUT CONTRAST TECHNIQUE: Contiguous axial images were obtained from the base of the skull through the vertex without intravenous contrast. COMPARISON:  None. FINDINGS: Brain: Patchy low-attenuation changes in the deep  white matter consistent small vessel ischemia. Minimal cerebral atrophic changes particularly for age. No mass effect or midline shift. No abnormal extra-axial fluid collections. Gray-white matter junctions are distinct. Basal cisterns are not effaced. No acute intracranial hemorrhage. Vascular: Intracranial arterial  vascular calcifications are present. Skull: Calvarium appears intact. Sinuses/Orbits: Paranasal sinuses and mastoid air cells are clear. Other: None. IMPRESSION: No acute intracranial abnormalities. Mild chronic atrophic and small vessel ischemic changes. Electronically Signed   By: Lucienne Capers M.D.   On: 09/19/2018 21:28   Ct Abdomen Pelvis W Contrast  Result Date: 09/20/2018 CLINICAL DATA:  Abdominal pain, acute, generalized, with fever. Elevated lactic acid. Nausea and vomiting. EXAM: CT ABDOMEN AND PELVIS WITH CONTRAST TECHNIQUE: Multidetector CT imaging of the abdomen and pelvis was performed using the standard protocol following bolus administration of intravenous contrast. CONTRAST:  165m ISOVUE-300 IOPAMIDOL (ISOVUE-300) INJECTION 61% COMPARISON:  Abdominal ultrasound 09/19/2018 FINDINGS: Lower chest: The lung bases demonstrate mild dependent atelectasis bilaterally. No focal nodule, mass, or airspace disease is present. The heart size is normal. No significant pleural or pericardial effusion is present. Hepatobiliary: There is diffuse fatty infiltration liver. There is mild prominence of the gallbladder wall. There is slight stranding about the gallbladder. Fluid is present in the gallbladder fossa. The common bile duct is within normal limits. Pancreas: Unremarkable. No pancreatic ductal dilatation or surrounding inflammatory changes. Spleen: Normal in size without focal abnormality. Adrenals/Urinary Tract: Kidneys and ureters are within normal limits. There is no significant stone or mass lesion. No hydronephrosis is present. The urinary bladder is within normal limits.  Stomach/Bowel: The stomach and duodenum are within normal limits. Small bowel is unremarkable. The appendix is surgically absent. The ascending and transverse colon are within normal limits. Diverticular changes are present in the descending and sigmoid colon. Vascular/Lymphatic: Atherosclerotic changes are present in the aorta without aneurysm. No significant retroperitoneal adenopathy is present. Reproductive: Status post hysterectomy. No adnexal masses. Other: A paraumbilical hernia is up to 3 cm wide. This contains fat, but no bowel. Musculoskeletal: Grade 1 degenerative anterolisthesis at L4-5 measures 7 mm. AP alignment is otherwise anatomic. Vertebral body heights are maintained. Moderate bilateral facet hypertrophy is present at L4-5. This contributes to moderate foraminal narrowing in addition to uncovering of a broad-based disc protrusion. IMPRESSION: 1. Mild enhancement and some inflammatory change about the gallbladder. In concert with borderline findings on ultrasound, this raises a higher concerning for developing acute cholecystitis. 2. Hepatic steatosis. 3.  Aortic Atherosclerosis (ICD10-I70.0). 4. Sigmoid diverticulosis without diverticulitis. 5. Paraumbilical hernia measuring over 3 cm contains fat, but no bowel. 6. Degenerative changes of the lumbar spine are most evident at L4-5. Electronically Signed   By: CSan MorelleM.D.   On: 09/20/2018 01:57   UKoreaAbdomen Limited Ruq  Result Date: 09/19/2018 CLINICAL DATA:  Right upper quadrant abdominal pain. EXAM: ULTRASOUND ABDOMEN LIMITED RIGHT UPPER QUADRANT COMPARISON:  None. FINDINGS: Gallbladder: Mild sludge is noted. Gallbladder wall is upper limits of normal at 3 mm. There is no sonographic Murphy sign. Common bile duct: Diameter: 6 mm, upper limits of normal Liver: The liver is diffusely echogenic. No discrete lesions are present. No significant nodularity is noted. Portal vein is patent on color Doppler imaging with normal direction  of blood flow towards the liver. IMPRESSION: 1. Mild sludge in the gallbladder without evidence for cholecystitis. 2. Hepatic steatosis.  No discrete liver lesions are present. Electronically Signed   By: CSan MorelleM.D.   On: 09/19/2018 23:10    EKG: Independently reviewed.  Sinus rhythm, baseline wander and lateral leads.  Assessment/Plan Principal Problem:   Sepsis ([place] Active Problems:   Type 1 diabetes, controlled, with neuropathy (HCC)   Hyperlipidemia   HTN (hypertension)   Fever, unspecified  Acute cholecystitis   CKD (chronic kidney disease) stage 3, GFR 30-59 ml/min (HCC)   Anxiety   Diabetic neuropathy (HCC)   Sepsis secondary to suspected acute cholecystitis Temperature 104 F in the ED.  Not tachycardic and blood pressure stable.  No leukocytosis.  LFTs elevated - AST 425, ALT 238, and T bili 1.6.  Alk phos normal.  Lipase normal.  Lactic acid 3.7; improved to 3.2 with IV fluid. Abdominal right upper quadrant ultrasound showing mild sludge in the gallbladder without evidence of cholecystitis and hepatic steatosis; no discrete liver lesions. CT abdomen pelvis concerning for acute cholecystitis. -I spoke to Dr. Johney Maine and updated him with the CT findings. Dr. Johney Maine will see the patient.  -Continue broad-spectrum antibiotics: Vancomycin and Zosyn -Continue IV fluid resuscitation -Continue to trend lactate -Blood culture x2 pending -UA negative.  Urine culture pending. -Tylenol PRN -Keep n.p.o. -CMP in a.m.  Requested medical clearance for surgery Patient denies having any chest pain or shortness of breath.  Per daughter at bedside, she is very physically active and goes to the gym on a regular basis.  Patient is able to meet 4 METS. Nuclear stress test done in November 2014 was normal. -EKG not suggestive of ACS, however, baseline wander in lateral leads.  Repeat EKG in a.m. -According to revised cardiac risk index calculator, patient is class II risk with a 6.0%  30-day risk of MI, death, or cardiac arrest. -Check echocardiogram  IDDM  Blood glucose 228 on admission. -Check A1c -Patient n.p.o. at this time -Sliding scale insulin moderate -CBG checks  CKD 3 -Creatinine 1.0, stable.  Baseline 0.9 to 1.1 a year ago.  Hypertension  -Currently normotensive.  Not on any home medications.  Anxiety -Continue home clorazepate prn  Diabetic neuropathy -Continue home gabapentin  Hyperlipidemia -Hold home Lipitor at this time in the setting of elevated LFTs  GERD -Continue PPI   DVT prophylaxis: SCDs Code Status: Patient wishes to be full code. Family Communication: Daughter at bedside updated. Disposition Plan: Anticipate discharge to home in 1 to 2 days. Consults called: General surgery (Dr. Johney Maine) Admission status: It is my clinical opinion that admission to INPATIENT is reasonable and necessary in this 82 y.o. female . presenting with symptoms of fevers, chills, concerning for sepsis secondary to suspected acute cholecystitis . in the context of PMH including: Hypertension, hyperlipidemia, type II diabetes, CKD 3 . and pertinent positives on radiographic and laboratory data including: CT abdomen pelvis with evidence of acute cholecystitis. . Workup and treatment include IV broad-spectrum antibiotics, IV fluid.  Possible surgery, pending surgery evaluation.  Given the aforementioned, the predictability of an adverse outcome is felt to be significant. I expect that the patient will require at least 2 midnights in the hospital to treat this condition.    Shela Leff MD [facility] Pager [phone]  If 7PM-7AM, please contact night-coverage [email or web address] Password [email or web address]  09/20/2018, 4:01 AM

## 2018-09-20 NOTE — Progress Notes (Signed)
PHARMACY - PHYSICIAN COMMUNICATION CRITICAL VALUE ALERT - BLOOD CULTURE IDENTIFICATION (BCID)  [name] is an 82 y.o. female who presented to [facility] on 09/19/2018 with a chief complaint of abdominal pain  Name of physician (or Provider) ContactedVenetia Constable  Current antibiotics: zosyn  Changes to prescribed antibiotics recommended:  Ceftriaxone 2gm IV q24h  Results for orders placed or performed during the hospital encounter of 09/19/18  Blood Culture ID Panel (Reflexed) (Collected: 09/19/2018  8:50 PM)  Result Value Ref Range   Enterococcus species NOT DETECTED NOT DETECTED   Listeria monocytogenes NOT DETECTED NOT DETECTED   Staphylococcus species NOT DETECTED NOT DETECTED   Staphylococcus aureus (BCID) NOT DETECTED NOT DETECTED   Streptococcus species NOT DETECTED NOT DETECTED   Streptococcus agalactiae NOT DETECTED NOT DETECTED   Streptococcus pneumoniae NOT DETECTED NOT DETECTED   Streptococcus pyogenes NOT DETECTED NOT DETECTED   Acinetobacter baumannii NOT DETECTED NOT DETECTED   Enterobacteriaceae species DETECTED (A) NOT DETECTED   Enterobacter cloacae complex NOT DETECTED NOT DETECTED   Escherichia coli DETECTED (A) NOT DETECTED   Klebsiella oxytoca NOT DETECTED NOT DETECTED   Klebsiella pneumoniae NOT DETECTED NOT DETECTED   Proteus species NOT DETECTED NOT DETECTED   Serratia marcescens NOT DETECTED NOT DETECTED   Carbapenem resistance NOT DETECTED NOT DETECTED   Haemophilus influenzae NOT DETECTED NOT DETECTED   Neisseria meningitidis NOT DETECTED NOT DETECTED   Pseudomonas aeruginosa NOT DETECTED NOT DETECTED   Candida albicans NOT DETECTED NOT DETECTED   Candida glabrata NOT DETECTED NOT DETECTED   Candida krusei NOT DETECTED NOT DETECTED   Candida parapsilosis NOT DETECTED NOT DETECTED   Candida tropicalis NOT DETECTED NOT DETECTED    Dolly Rias RPh 09/20/2018, 1:12 PM Pager [phone]

## 2018-09-20 NOTE — ED Notes (Signed)
Floor RN requested CT be done before pt be transported upstairs. Since section is closing we were able to hold pt down here until CT is completed.

## 2018-09-21 LAB — GLUCOSE, CAPILLARY
Glucose-Capillary: 140 mg/dL — ABNORMAL HIGH (ref 70–99)
Glucose-Capillary: 147 mg/dL — ABNORMAL HIGH (ref 70–99)
Glucose-Capillary: 161 mg/dL — ABNORMAL HIGH (ref 70–99)
Glucose-Capillary: 218 mg/dL — ABNORMAL HIGH (ref 70–99)
Glucose-Capillary: 228 mg/dL — ABNORMAL HIGH (ref 70–99)
Glucose-Capillary: 259 mg/dL — ABNORMAL HIGH (ref 70–99)
Glucose-Capillary: 263 mg/dL — ABNORMAL HIGH (ref 70–99)

## 2018-09-21 LAB — CBC
HCT: 36.3 % (ref 36.0–46.0)
Hemoglobin: 11.3 g/dL — ABNORMAL LOW (ref 12.0–15.0)
MCH: 26.6 pg (ref 26.0–34.0)
MCHC: 31.1 g/dL (ref 30.0–36.0)
MCV: 85.4 fL (ref 80.0–100.0)
Platelets: 226 10*3/uL (ref 150–400)
RBC: 4.25 MIL/uL (ref 3.87–5.11)
RDW: 14.9 % (ref 11.5–15.5)
WBC: 4.9 10*3/uL (ref 4.0–10.5)
nRBC: 0 % (ref 0.0–0.2)

## 2018-09-21 LAB — SURGICAL PCR SCREEN
MRSA, PCR: NEGATIVE
Staphylococcus aureus: NEGATIVE

## 2018-09-21 LAB — HEPATITIS PANEL, ACUTE
HCV Ab: <0.1 s/co ratio (ref 0.0–0.9)
Hep A IgM: NEGATIVE
Hep B C IgM: NEGATIVE
Hepatitis B Surface Ag: NEGATIVE

## 2018-09-21 LAB — HEMOGLOBIN A1C
Hgb A1c MFr Bld: 8.2 % — ABNORMAL HIGH (ref 4.8–5.6)
Mean Plasma Glucose: 188.64 mg/dL

## 2018-09-21 LAB — URINE CULTURE: Culture: NO GROWTH

## 2018-09-21 MED ORDER — ADULT MULTIVITAMIN W/MINERALS CH
1.0000 | ORAL_TABLET | Freq: Every day | ORAL | Status: DC
  Administered 2018-09-21 – 2018-09-22 (×2): 1 via ORAL
  Filled 2018-09-21 (×2): qty 1

## 2018-09-21 MED ORDER — INSULIN ASPART 100 UNIT/ML ~~LOC~~ SOLN
3.0000 [IU] | Freq: Three times a day (TID) | SUBCUTANEOUS | Status: DC
  Administered 2018-09-21 – 2018-09-22 (×2): 3 [IU] via SUBCUTANEOUS

## 2018-09-21 MED ORDER — INSULIN DEGLUDEC 100 UNIT/ML ~~LOC~~ SOPN: 30.0000 [IU] | PEN_INJECTOR | Freq: Every day | SUBCUTANEOUS | Status: DC

## 2018-09-21 MED ORDER — CALCIUM CARBONATE-VITAMIN D 500-200 MG-UNIT PO TABS
1.0000 | ORAL_TABLET | Freq: Two times a day (BID) | ORAL | Status: DC
  Administered 2018-09-21 – 2018-09-22 (×2): 1 via ORAL
  Filled 2018-09-21 (×2): qty 1

## 2018-09-21 MED ORDER — INSULIN ASPART 100 UNIT/ML ~~LOC~~ SOLN
0.0000 [IU] | Freq: Every day | SUBCUTANEOUS | Status: DC
  Administered 2018-09-21: 3 [IU] via SUBCUTANEOUS

## 2018-09-21 MED ORDER — INSULIN DETEMIR 100 UNIT/ML ~~LOC~~ SOLN
20.0000 [IU] | Freq: Two times a day (BID) | SUBCUTANEOUS | Status: DC
  Administered 2018-09-21 – 2018-09-22 (×3): 20 [IU] via SUBCUTANEOUS
  Filled 2018-09-21 (×4): qty 0.2

## 2018-09-21 MED ORDER — POLYETHYLENE GLYCOL 3350 17 G PO PACK
17.0000 g | PACK | Freq: Every day | ORAL | Status: DC
  Administered 2018-09-21: 17 g via ORAL
  Filled 2018-09-21 (×2): qty 1

## 2018-09-21 MED ORDER — MULTIVITAMINS PO CAPS: 1.0000 | ORAL_CAPSULE | Freq: Every day | ORAL | Status: DC

## 2018-09-21 MED ORDER — VITAMIN D 25 MCG (1000 UNIT) PO TABS
1000.0000 [IU] | ORAL_TABLET | Freq: Every day | ORAL | Status: DC
  Administered 2018-09-21 – 2018-09-22 (×2): 1000 [IU] via ORAL

## 2018-09-21 MED ORDER — INSULIN ASPART 100 UNIT/ML ~~LOC~~ SOLN
0.0000 [IU] | Freq: Three times a day (TID) | SUBCUTANEOUS | Status: DC
  Administered 2018-09-21: 8 [IU] via SUBCUTANEOUS
  Administered 2018-09-21: 5 [IU] via SUBCUTANEOUS
  Administered 2018-09-22: 3 [IU] via SUBCUTANEOUS

## 2018-09-21 MED ORDER — BISOPROLOL-HYDROCHLOROTHIAZIDE 2.5-6.25 MG PO TABS
1.0000 | ORAL_TABLET | Freq: Every day | ORAL | Status: DC
  Administered 2018-09-21 – 2018-09-22 (×2): 1 via ORAL
  Filled 2018-09-21 (×2): qty 1

## 2018-09-21 MED ORDER — FAMOTIDINE 20 MG PO TABS
20.0000 mg | ORAL_TABLET | Freq: Two times a day (BID) | ORAL | Status: DC
  Administered 2018-09-21 – 2018-09-22 (×2): 20 mg via ORAL
  Filled 2018-09-21 (×2): qty 1

## 2018-09-21 NOTE — Progress Notes (Signed)
PHARMACIST - PHYSICIAN COMMUNICATION  DR:   Venetia Constable  CONCERNING: IV to Oral Route Change Policy  RECOMMENDATION: This patient is receiving famotidine by the intravenous route.  Based on criteria approved by the Pharmacy and Therapeutics Committee, the intravenous medication(s) is/are being converted to the equivalent oral dose form(s).   DESCRIPTION: These criteria include:  The patient is eating (either orally or via tube) and/or has been taking other orally administered medications for a least 24 hours  The patient has no evidence of active gastrointestinal bleeding or impaired GI absorption (gastrectomy, short bowel, patient on TNA or NPO).  If you have questions about this conversion, please contact the Pharmacy Department  []   [phone] )  [facility] []   [phone] )  [facility] []   [phone] )  [facility] []   [phone] )  [facility] [x]   [phone] )  [place], PharmD, BCPS 09/21/2018 3:28 PM

## 2018-09-21 NOTE — Progress Notes (Signed)
[facility] PROGRESS NOTE    Progress Note  [name]  QQV:[id number] DOB: 09/25/1936 DOA: 09/19/2018 PCP: Janie Morning, DO     Brief Narrative:   [name] is an 82 y.o. female past medical history of hypertension, insulin-dependent diabetes mellitus comes into the hospital for fevers and chills started the day prior to admission accompanied by nausea, she reports left lower quadrant pain that she only had the day prior to admission but none today in the ED was found to be febrile with no leukocytosis, with mild elevation in LFTs and total bilirubin, and abdominal ultrasound showed mild sludge in the gallbladder without evidence of cholecystitis Dr. gross was consulted recommended a CT scan of the abdomen and pelvis that showed gallbladder inflammatory changes and IV antibiotics  Assessment/Plan:   Sepsis due E. Coli bacteremia: The HIDA scan was negative. Surgery recommended to advance diet no intervention. They will sign off. Continue IV Zosyn and culture sensitivities are pending. Unclear source she has no cuts or sores UA does not show urinary tract infection, cultures are negative.  Preop evaluation: Clear stress test done in November 2014 that showed no ischemic changes. She is able to perform more than 4 metabolic equivalents. EKG shows no signs of ischemia. Lyndel Safe Perioperative Cardiac Risk is 0.05% Risk and benefits explained to the patient and she would like to proceed with surgery if needed.  Type 1 diabetes, controlled, with neuropathy ([place]) Allow diet check an A1c.  Start sliding scale divided in twice daily at a lower dose. Start sliding scale insulin.  Chronic kidney disease stage II: Creatinine at baseline.  Essential hypertension: Resume home meds  Anxiety: Continue medications.  Diabetic neuropathy: Continue gabapentin.  Hyperlipidemia: Continue Lipitor.     DVT prophylaxis: lovenxo Family  Communication:daughter Disposition Plan/Barrier to D/C: unable to determine Code Status:  Code Status History    Date Active Date Inactive Code Status Order ID Comments User Context   06/03/2014 1228 06/05/2014 1821 Full Code [id number]  Melina Schools, MD Inpatient        IV Access:    Peripheral IV   Procedures and diagnostic studies:   Dg Chest 2 View  Result Date: 09/19/2018 CLINICAL DATA:  Fever and cough for 3 days. EXAM: CHEST - 2 VIEW COMPARISON:  09/23/2010 FINDINGS: Shallow inspiration. Heart size and pulmonary vascularity are normal for technique. No airspace disease or consolidation in the lungs. No blunting of costophrenic angles. No pneumothorax. Mediastinal contours appear intact. Degenerative changes in the spine and shoulders. IMPRESSION: No active cardiopulmonary disease. Electronically Signed   By: Lucienne Capers M.D.   On: 09/19/2018 21:14   Ct Head Wo Contrast  Result Date: 09/19/2018 CLINICAL DATA:  Altered mental status. Vomiting. EXAM: CT HEAD WITHOUT CONTRAST TECHNIQUE: Contiguous axial images were obtained from the base of the skull through the vertex without intravenous contrast. COMPARISON:  None. FINDINGS: Brain: Patchy low-attenuation changes in the deep white matter consistent small vessel ischemia. Minimal cerebral atrophic changes particularly for age. No mass effect or midline shift. No abnormal extra-axial fluid collections. Gray-white matter junctions are distinct. Basal cisterns are not effaced. No acute intracranial hemorrhage. Vascular: Intracranial arterial vascular calcifications are present. Skull: Calvarium appears intact. Sinuses/Orbits: Paranasal sinuses and mastoid air cells are clear. Other: None. IMPRESSION: No acute intracranial abnormalities. Mild chronic atrophic and small vessel ischemic changes. Electronically Signed   By: Lucienne Capers M.D.   On: 09/19/2018 21:28   Ct Abdomen Pelvis W Contrast  Result  Date: 09/20/2018 CLINICAL  DATA:  Abdominal pain, acute, generalized, with fever. Elevated lactic acid. Nausea and vomiting. EXAM: CT ABDOMEN AND PELVIS WITH CONTRAST TECHNIQUE: Multidetector CT imaging of the abdomen and pelvis was performed using the standard protocol following bolus administration of intravenous contrast. CONTRAST:  165mL ISOVUE-300 IOPAMIDOL (ISOVUE-300) INJECTION 61% COMPARISON:  Abdominal ultrasound 09/19/2018 FINDINGS: Lower chest: The lung bases demonstrate mild dependent atelectasis bilaterally. No focal nodule, mass, or airspace disease is present. The heart size is normal. No significant pleural or pericardial effusion is present. Hepatobiliary: There is diffuse fatty infiltration liver. There is mild prominence of the gallbladder wall. There is slight stranding about the gallbladder. Fluid is present in the gallbladder fossa. The common bile duct is within normal limits. Pancreas: Unremarkable. No pancreatic ductal dilatation or surrounding inflammatory changes. Spleen: Normal in size without focal abnormality. Adrenals/Urinary Tract: Kidneys and ureters are within normal limits. There is no significant stone or mass lesion. No hydronephrosis is present. The urinary bladder is within normal limits. Stomach/Bowel: The stomach and duodenum are within normal limits. Small bowel is unremarkable. The appendix is surgically absent. The ascending and transverse colon are within normal limits. Diverticular changes are present in the descending and sigmoid colon. Vascular/Lymphatic: Atherosclerotic changes are present in the aorta without aneurysm. No significant retroperitoneal adenopathy is present. Reproductive: Status post hysterectomy. No adnexal masses. Other: A paraumbilical hernia is up to 3 cm wide. This contains fat, but no bowel. Musculoskeletal: Grade 1 degenerative anterolisthesis at L4-5 measures 7 mm. AP alignment is otherwise anatomic. Vertebral body heights are maintained. Moderate bilateral facet  hypertrophy is present at L4-5. This contributes to moderate foraminal narrowing in addition to uncovering of a broad-based disc protrusion. IMPRESSION: 1. Mild enhancement and some inflammatory change about the gallbladder. In concert with borderline findings on ultrasound, this raises a higher concerning for developing acute cholecystitis. 2. Hepatic steatosis. 3.  Aortic Atherosclerosis (ICD10-I70.0). 4. Sigmoid diverticulosis without diverticulitis. 5. Paraumbilical hernia measuring over 3 cm contains fat, but no bowel. 6. Degenerative changes of the lumbar spine are most evident at L4-5. Electronically Signed   By: San Morelle M.D.   On: 09/20/2018 01:57   Nm Hepato W/eject Fract  Result Date: 09/20/2018 CLINICAL DATA:  Right upper quadrant pain.  Gallbladder sludge. EXAM: NUCLEAR MEDICINE HEPATOBILIARY IMAGING WITH GALLBLADDER EF TECHNIQUE: Sequential images of the abdomen were obtained out to 60 minutes following intravenous administration of radiopharmaceutical. After oral ingestion of Ensure, gallbladder ejection fraction was determined. At 60 min, normal ejection fraction is greater than 33%. RADIOPHARMACEUTICALS:  7.8 mCi Tc-84m  Choletec IV COMPARISON:  CT 09/20/2018 and ultrasound 09/19/2018. FINDINGS: Prompt uptake and biliary excretion of activity by the liver is seen. Gallbladder activity is visualized approximately 40 minutes after the administration of the radiopharmaceutical. Biliary activity passes into small bowel, consistent with patent common bile duct. Following the fatty meal dynamic imaging was carried out for an additional 60 minutes. Considerable motion artifact was noted during this portion of the exam resulting in unreliable measurements of gallbladder ejection fraction. Visually, no significant emptying of the gallbladder activity noted. On the corresponding graft outlying gallbladder activity over time a similar amount of activity (counts) within the gallbladder at the  end of 60 minutes compared with the immediately following ingestion of fatty meal. No symptoms reported after ingestion of the fatty meal. IMPRESSION: 1. Patent cystic duct without evidence for acute cholecystitis. 2. Evaluation of gallbladder ejection fraction limited due to patient motion. Subjectively, there was no  significant response to fatty meal noted, suggesting biliary dysfunction. These findings should be interpreted in the appropriate clinical setting. Electronically Signed   By: Kerby Moors M.D.   On: 09/20/2018 16:56   US Abdomen Limited Ruq  Result Date: 09/19/2018 CLINICAL DATA:  Right upper quadrant abdominal pain. EXAM: ULTRASOUND ABDOMEN LIMITED RIGHT UPPER QUADRANT COMPARISON:  None. FINDINGS: Gallbladder: Mild sludge is noted. Gallbladder wall is upper limits of normal at 3 mm. There is no sonographic Murphy sign. Common bile duct: Diameter: 6 mm, upper limits of normal Liver: The liver is diffusely echogenic. No discrete lesions are present. No significant nodularity is noted. Portal vein is patent on color Doppler imaging with normal direction of blood flow towards the liver. IMPRESSION: 1. Mild sludge in the gallbladder without evidence for cholecystitis. 2. Hepatic steatosis.  No discrete liver lesions are present. Electronically Signed   By: San Morelle M.D.   On: 09/19/2018 23:10     Medical Consultants:    None.  Anti-Infectives:   Zosyn  Subjective:    [name] No pain, hungry.  Objective:    Vitals:   09/20/18 1739 09/20/18 2105 09/20/18 2317 09/21/18 0526  BP: (!) 135/54 (!) 128/54  (!) 125/52  Pulse: 79 86  72  Resp: 20 18  17   Temp: 98 F (36.7 C) 100.2 F (37.9 C) 99.1 F (37.3 C) 98.3 F (36.8 C)  TempSrc: Oral Oral Oral Oral  SpO2: 98% 97%  96%  Weight:      Height:        Intake/Output Summary (Last 24 hours) at 09/21/2018 0837 Last data filed at 09/21/2018 0327 Gross per 24 hour  Intake 742.89 ml  Output 2400 ml   Net -1657.11 ml   Filed Weights   09/19/18 2214 09/20/18 0208  Weight: 77.1 kg 82.5 kg    Exam: General exam: In no acute distress. Respiratory system: Good air movement and clear to auscultation. Cardiovascular system: S1 & S2 heard, RRR.  Gastrointestinal system: Abdomen is nondistended, soft and nontender, negative Murphy sign.  Central nervous system: Alert and oriented. No focal neurological deficits. Extremities: No pedal edema. Skin: No rashes, lesions or ulcers Psychiatry: Judgement and insight appear normal. Mood & affect appropriate.    Data Reviewed:    Labs: Basic Metabolic Panel: Recent Labs  Lab 09/19/18 2050 09/20/18 0549  NA 139 143  K 3.7 3.8  CL 102 110  CO2 25 23  GLUCOSE 228* 144*  BUN 16 13  CREATININE 1.05* 0.97  CALCIUM 9.6 8.7*   GFR Estimated Creatinine Clearance: 50.1 mL/min (by C-G formula based on SCr of 0.97 mg/dL). Liver Function Tests: Recent Labs  Lab 09/19/18 2050 09/20/18 0549  AST 425* 193*  ALT 238* 196*  ALKPHOS 80 75  BILITOT 1.6* 2.1*  PROT 7.5 6.6  ALBUMIN 4.1 3.4*   Recent Labs  Lab 09/19/18 2046  LIPASE 35   No results for input(s): AMMONIA in the last 168 hours. Coagulation profile Recent Labs  Lab 09/20/18 1717  INR 1.24    CBC: Recent Labs  Lab 09/19/18 2050 09/20/18 0549 09/21/18 0456  WBC 6.7 9.0 4.9  NEUTROABS 5.9  --   --   HGB 12.7 11.8* 11.3*  HCT 40.2 37.3 36.3  MCV 85.4 85.2 85.4  PLT 271 237 226   Cardiac Enzymes: No results for input(s): CKTOTAL, CKMB, CKMBINDEX, TROPONINI in the last 168 hours. BNP (last 3 results) No results for input(s): PROBNP in the last  8760 hours. CBG: Recent Labs  Lab 09/20/18 2015 09/21/18 0009 09/21/18 0250 09/21/18 0456 09/21/18 0812  GLUCAP 262* 228* 161* 147* 140*   D-Dimer: No results for input(s): DDIMER in the last 72 hours. Hgb A1c: Recent Labs    09/20/18 0549  HGBA1C 8.3*   Lipid Profile: No results for input(s): CHOL, HDL,  LDLCALC, TRIG, CHOLHDL, LDLDIRECT in the last 72 hours. Thyroid function studies: No results for input(s): TSH, T4TOTAL, T3FREE, THYROIDAB in the last 72 hours.  Invalid input(s): FREET3 Anemia work up: No results for input(s): VITAMINB12, FOLATE, FERRITIN, TIBC, IRON, RETICCTPCT in the last 72 hours. Sepsis Labs: Recent Labs  Lab 09/19/18 2050 09/19/18 2105 09/19/18 2303 09/20/18 0549 09/20/18 0734 09/21/18 0456  WBC 6.7  --   --  9.0  --  4.9  LATICACIDVEN  --  3.78* 3.23*  --  1.8  --    Microbiology Recent Results (from the past 240 hour(s))  Blood Culture (routine x 2)     Status: None (Preliminary result)   Collection Time: 09/19/18  8:50 PM  Result Value Ref Range Status   Specimen Description   Final    BLOOD LEFT ANTECUBITAL Performed at [facility], [place] [address], [place], [place] [address]    Special Requests   Final    BOTTLES DRAWN AEROBIC AND ANAEROBIC Blood Culture adequate volume Performed at [facility] [address], [place], [place] [address]    Culture  Setup Time   Final    GRAM NEGATIVE RODS IN BOTH AEROBIC AND ANAEROBIC BOTTLES Organism ID to follow CRITICAL RESULT CALLED TO, READ BACK BY AND VERIFIED WITH: Seleta Rhymes PharmD 13:00 09/20/18 ([email or web address])    Culture   Final    NO GROWTH < 24 HOURS Performed at [facility], [address], [place], [place] [address]    Report Status PENDING  Incomplete  Blood Culture (routine x 2)     Status: None (Preliminary result)   Collection Time: 09/19/18  8:50 PM  Result Value Ref Range Status   Specimen Description   Final    BLOOD RIGHT FOREARM Performed at [facility] [address], [place], [place] [address]    Special Requests   Final    BOTTLES DRAWN AEROBIC AND ANAEROBIC Blood Culture adequate volume Performed at [facility] [address] [place], [place] [address]    Culture  Setup Time   Final    GRAM  NEGATIVE RODS AEROBIC BOTTLE ONLY CRITICAL VALUE NOTED.  VALUE IS CONSISTENT WITH PREVIOUSLY REPORTED AND CALLED VALUE. Performed at [facility], [place] [address] [place], [place] [address]    Culture GRAM NEGATIVE RODS  Final   Report Status PENDING  Incomplete  Blood Culture ID Panel (Reflexed)     Status: Abnormal   Collection Time: 09/19/18  8:50 PM  Result Value Ref Range Status   Enterococcus species NOT DETECTED NOT DETECTED Final   Listeria monocytogenes NOT DETECTED NOT DETECTED Final   Staphylococcus species NOT DETECTED NOT DETECTED Final   Staphylococcus aureus (BCID) NOT DETECTED NOT DETECTED Final   Streptococcus species NOT DETECTED NOT DETECTED Final   Streptococcus agalactiae NOT DETECTED NOT DETECTED Final   Streptococcus pneumoniae NOT DETECTED NOT DETECTED Final   Streptococcus pyogenes NOT DETECTED NOT DETECTED Final   Acinetobacter baumannii NOT DETECTED NOT DETECTED Final   Enterobacteriaceae species DETECTED (A) NOT DETECTED Final    Comment: Enterobacteriaceae represent  a large family of gram-negative bacteria, not a single organism. CRITICAL RESULT CALLED TO, READ BACK BY AND VERIFIED WITH: Seleta Rhymes PharmD 13:00 09/20/18 ([email or web address])    Enterobacter cloacae complex NOT DETECTED NOT DETECTED Final   Escherichia coli DETECTED (A) NOT DETECTED Final    Comment: CRITICAL RESULT CALLED TO, READ BACK BY AND VERIFIED WITH: Seleta Rhymes PharmD 13:00 09/20/18 ([email or web address])    Klebsiella oxytoca NOT DETECTED NOT DETECTED Final   Klebsiella pneumoniae NOT DETECTED NOT DETECTED Final   Proteus species NOT DETECTED NOT DETECTED Final   Serratia marcescens NOT DETECTED NOT DETECTED Final   Carbapenem resistance NOT DETECTED NOT DETECTED Final   Haemophilus influenzae NOT DETECTED NOT DETECTED Final   Neisseria meningitidis NOT DETECTED NOT DETECTED Final   Pseudomonas aeruginosa NOT DETECTED NOT DETECTED Final   Candida albicans NOT DETECTED NOT DETECTED Final   Candida  glabrata NOT DETECTED NOT DETECTED Final   Candida krusei NOT DETECTED NOT DETECTED Final   Candida parapsilosis NOT DETECTED NOT DETECTED Final   Candida tropicalis NOT DETECTED NOT DETECTED Final    Comment: Performed at [facility], [place] [address] [place], [place] [address]  Urine culture     Status: None   Collection Time: 09/19/18 11:05 PM  Result Value Ref Range Status   Specimen Description   Final    URINE, RANDOM Performed at [facility] [address] [place], [place] [address]    Special Requests   Final    NONE Performed at [facility], [place] [address] [place], [place] [address]    Culture   Final    NO GROWTH Performed at [facility], [place] [address] [place], [place] [address]    Report Status 09/21/2018 FINAL  Final  Surgical pcr screen     Status: None   Collection Time: 09/21/18  5:02 AM  Result Value Ref Range Status   MRSA, PCR NEGATIVE NEGATIVE Final   Staphylococcus aureus NEGATIVE NEGATIVE Final    Comment: (NOTE) The Xpert SA Assay (FDA approved for NASAL specimens in patients 70 years of age and older), is one component of a comprehensive surveillance program. It is not intended to diagnose infection nor to guide or monitor treatment. Performed at [facility], [place] [address], [place], [place] [address]      Medications:   . aspirin  325 mg Oral Daily  . bisacodyl  10 mg Rectal Daily  . dorzolamide-timolol  1 drop Both Eyes BID  . insulin aspart  0-15 Units Subcutaneous Q4H  . insulin glargine  20 Units Subcutaneous QHS  . latanoprost  1 drop Both Eyes QHS  . lip balm  1 application Topical BID  . vitamin C  500 mg Oral BID   Continuous Infusions: . sodium chloride 10 mL/hr at 09/21/18 0327  . cefTRIAXone (ROCEPHIN)  IV Stopped (09/20/18 1657)  . famotidine (PEPCID) IV Stopped (09/20/18 2302)  . lactated ringers    . methocarbamol (ROBAXIN) IV    . ondansetron  (ZOFRAN) IV       LOS: 2 days   Charlynne Cousins  [facility]   *Please refer to [email or web address], password [email or web address] to get updated schedule on who will round on this patient, as hospitalists switch teams weekly. If 7PM-7AM, please contact night-coverage at [email or web address], password [email or web address] for any overnight needs.  09/21/2018, 8:37 AM

## 2018-09-21 NOTE — Progress Notes (Signed)
HIDA negative for cholecystitis  Will sign off

## 2018-09-21 NOTE — Evaluation (Signed)
Physical Therapy Evaluation Patient Details Name: [name] MRN: [id number] DOB: 11/10/1936 Today's Date: 09/21/2018   History of Present Illness  82 yo female with onset of sepsis from unknown source, with labs returning to normal and noted gallbladder sludge with no signs of cholecystitis.  L side pain is resolved, abt ordered for gallbladder inflammation.  PMHx:  hepatic steatosis, diverticulosis, atherosclerosis, DJD L4-5, HTN, DM, CKD 2, PN, anxiety,   Clinical Impression  Pt was seen for evaluation of mobility with good effort by pt to walk and climb stairs with RW to support.  Daughter is there who lives next door and is supportive despite daughter having a physical challenge as well.  Pt will continue on with therapy as needed acutely and transition care to HHPT for completion of stabilizing and strengthening activities.  Pt is motivated and maintained O2 sats with all efforts on room air.    Follow Up Recommendations Home health PT;Supervision - Intermittent    Equipment Recommendations  Rolling walker with 5" wheels    Recommendations for Other Services       Precautions / Restrictions Precautions Precautions: Fall Restrictions Weight Bearing Restrictions: No      Mobility  Bed Mobility Overal bed mobility: Needs Assistance Bed Mobility: Supine to Sit     Supine to sit: Min guard     General bed mobility comments: pt used bed rail and received no assistance to pull up to sit but min guard used for safety  Transfers Overall transfer level: Needs assistance Equipment used: Rolling walker (2 wheeled);1 person hand held assist Transfers: Sit to/from Stand Sit to Stand: Min guard;Supervision         General transfer comment: first stand with help then was supervised  Ambulation/Gait Ambulation/Gait assistance: Min guard Gait Distance (Feet): 300 Feet(120, 80 x 2 ) Assistive device: Rolling walker (2 wheeled);1 person hand held assist Gait  Pattern/deviations: Step-through pattern;Decreased stride length;Wide base of support Gait velocity: redcued Gait velocity interpretation: <1.31 ft/sec, indicative of household ambulator General Gait Details: slow pace to control her energy and monitor her for O2 sats  Stairs Stairs: Yes Stairs assistance: Min guard Stair Management: One rail Right;Alternating pattern;Forwards Number of Stairs: 4 General stair comments: pt has 4 steps with 2 rails at home but handled steps with one rail  Wheelchair Mobility    Modified Rankin (Stroke Patients Only)       Balance Overall balance assessment: Needs assistance Sitting-balance support: Feet supported Sitting balance-Leahy Scale: Good     Standing balance support: Bilateral upper extremity supported;During functional activity Standing balance-Leahy Scale: Fair Standing balance comment: fair balance with no AD but will lose balance with no AD or HHA                              Pertinent Vitals/Pain Pain Assessment: No/denies pain    Home Living Family/patient expects to be discharged to:: Private residence Living Arrangements: Alone Available Help at Discharge: Family;Available PRN/intermittently Type of Home: House Home Access: Stairs to enter Entrance Stairs-Rails: Left Entrance Stairs-Number of Steps: 4 Home Layout: One level Home Equipment: Walker - 2 wheels;Cane - single point      Prior Function Level of Independence: Independent with assistive device(s)               Hand Dominance   Dominant Hand: Right    Extremity/Trunk Assessment   Upper Extremity Assessment Upper Extremity Assessment: Overall WFL for  tasks assessed    Lower Extremity Assessment Lower Extremity Assessment: Generalized weakness    Cervical / Trunk Assessment Cervical / Trunk Assessment: Kyphotic  Communication   Communication: No difficulties  Cognition Arousal/Alertness: Awake/alert Behavior During Therapy: WFL  for tasks assessed/performed Overall Cognitive Status: Within Functional Limits for tasks assessed                                 General Comments: light dizziness on first stand t hen cleared      General Comments General comments (skin integrity, edema, etc.): O2 sats on room air were 97% with gait    Exercises     Assessment/Plan    PT Assessment Patient needs continued PT services  PT Problem List Decreased strength;Decreased range of motion;Decreased activity tolerance;Decreased balance;Decreased mobility;Decreased coordination;Decreased knowledge of use of DME;Decreased safety awareness;Obesity       PT Treatment Interventions DME instruction;Gait training;Stair training;Functional mobility training;Therapeutic activities;Therapeutic exercise;Balance training;Neuromuscular re-education;Patient/family education    PT Goals (Current goals can be found in the Care Plan section)  Acute Rehab PT Goals Patient Stated Goal: to get walking and get home PT Goal Formulation: With patient/family Time For Goal Achievement: 10/05/18 Potential to Achieve Goals: Good    Frequency Min 3X/week   Barriers to discharge Inaccessible home environment;Decreased caregiver support home alone with stairs to enter house    Co-evaluation               AM-PAC PT "6 Clicks" Daily Activity  Outcome Measure Difficulty turning over in bed (including adjusting bedclothes, sheets and blankets)?: A Little Difficulty moving from lying on back to sitting on the side of the bed? : A Little Difficulty sitting down on and standing up from a chair with arms (e.g., wheelchair, bedside commode, etc,.)?: A Little Help needed moving to and from a bed to chair (including a wheelchair)?: A Little Help needed walking in hospital room?: A Little Help needed climbing 3-5 steps with a railing? : A Little 6 Click Score: 18    End of Session Equipment Utilized During Treatment: Gait  belt Activity Tolerance: Patient tolerated treatment well;Patient limited by fatigue Patient left: in bed;with call bell/phone within reach;with family/visitor present;with bed alarm set(sitting side of bed) Nurse Communication: Mobility status PT Visit Diagnosis: Muscle weakness (generalized) (M62.81);Difficulty in walking, not elsewhere classified (R26.2);Unsteadiness on feet (R26.81)    Time: 3790-2409 PT Time Calculation (min) (ACUTE ONLY): 27 min   Charges:   PT Evaluation $PT Eval Moderate Complexity: 1 Mod PT Treatments $Gait Training: 8-22 mins       Ramond Dial 09/21/2018, 2:04 PM   Mee Hives, PT MS Acute Rehab Dept. Number: [place] and [place]

## 2018-09-22 DIAGNOSIS — N178 Other acute kidney failure: Secondary | ICD-10-CM

## 2018-09-22 DIAGNOSIS — A4151 Sepsis due to Escherichia coli [E. coli]: Principal | ICD-10-CM

## 2018-09-22 DIAGNOSIS — I10 Essential (primary) hypertension: Secondary | ICD-10-CM

## 2018-09-22 DIAGNOSIS — A4150 Gram-negative sepsis, unspecified: Secondary | ICD-10-CM

## 2018-09-22 DIAGNOSIS — R7881 Bacteremia: Secondary | ICD-10-CM

## 2018-09-22 LAB — CULTURE, BLOOD (ROUTINE X 2)
Special Requests: ADEQUATE
Special Requests: ADEQUATE

## 2018-09-22 LAB — CBC
HCT: 36 % (ref 36.0–46.0)
Hemoglobin: 11.4 g/dL — ABNORMAL LOW (ref 12.0–15.0)
MCH: 26.8 pg (ref 26.0–34.0)
MCHC: 31.7 g/dL (ref 30.0–36.0)
MCV: 84.5 fL (ref 80.0–100.0)
Platelets: 261 10*3/uL (ref 150–400)
RBC: 4.26 MIL/uL (ref 3.87–5.11)
RDW: 14.7 % (ref 11.5–15.5)
WBC: 3.3 10*3/uL — ABNORMAL LOW (ref 4.0–10.5)
nRBC: 0 % (ref 0.0–0.2)

## 2018-09-22 LAB — GLUCOSE, CAPILLARY: Glucose-Capillary: 184 mg/dL — ABNORMAL HIGH (ref 70–99)

## 2018-09-22 MED ORDER — CEPHALEXIN 500 MG PO CAPS
500.0000 mg | ORAL_CAPSULE | Freq: Three times a day (TID) | ORAL | Status: DC
  Administered 2018-09-22: 500 mg via ORAL
  Filled 2018-09-22: qty 1

## 2018-09-22 MED ORDER — CEPHALEXIN 500 MG PO CAPS: 500.0000 mg | ORAL_CAPSULE | Freq: Three times a day (TID) | ORAL | 0 refills | Status: DC

## 2018-09-22 NOTE — Discharge Summary (Signed)
Physician Discharge Summary  [name] SHF:[id number] DOB: 1936/02/13 DOA: 09/19/2018  PCP: Janie Morning, DO  Admit date: 09/19/2018 Discharge date: 09/22/2018  Admitted From: home Disposition:  Home  Recommendations for Outpatient Follow-up:  1. Follow up with PCP in 1-2 weeks 2. Please obtain BMP/CBC in one week 3.   Home Health:No Equipment/Devices:none  Discharge Condition:Stable CODE STATUS:full  Diet recommendation: Heart Healthy   Brief/Interim Summary: 82 y.o. female past medical history of hypertension, insulin-dependent diabetes mellitus comes into the hospital for fevers and chills started the day prior to admission accompanied by nausea, she reports left lower quadrant pain that she only had the day prior to admission but none today in the ED was found to be febrile with no leukocytosis, with mild elevation in LFTs and total bilirubin, and abdominal ultrasound showed mild sludge in the gallbladder without evidence of cholecystitis Dr. gross was consulted recommended a CT scan of the abdomen and pelvis that showed gallbladder inflammatory changes and IV antibiotics  Discharge Diagnoses:  Principal Problem:   Sepsis ([place]) Active Problems:   Type 1 diabetes, controlled, with neuropathy ([place])   Hyperlipidemia   HTN (hypertension)   Fever, unspecified   Sepsis due to Gram negative bacteria ([facility])   CKD (chronic kidney disease) stage 3, GFR 30-59 ml/min ([facility])   Anxiety   Diabetic neuropathy ([place])   E coli bacteremia Sepsis due to E. coli bacteremia: CT scan of the abdomen and pelvis showed some gallbladder edema. She was started empirically on IV Zosyn surgery was consulted recommended discitis scan which was negative. Blood cultures came back with E. coli bacteremia she would continue antibiotics for total 2 weeks at home. No source was identified  Type I diabetes mellitus type 1 with neuropathy:  no changes made to her medication.  Chronic kidney disease  stage II: Creatinine remained at baseline.  Essential hypertension: No change made.  Diabetic neuropathy: Continue.   Discharge Instructions  Discharge Instructions    Diet - low sodium heart healthy   Complete by:  As directed    Increase activity slowly   Complete by:  As directed      Allergies as of 09/22/2018   No Known Allergies     Medication List    TAKE these medications   aspirin 325 MG EC tablet Take 325 mg by mouth daily.   atorvastatin 20 MG tablet Commonly known as:  LIPITOR Take 1 tablet (20 mg total) by mouth daily.   B-D ULTRAFINE III SHORT PEN 31G X 8 MM Misc Generic drug:  Insulin Pen Needle use 3 to 4 TIMES A DAY as directed by prescriber   bisoprolol-hydrochlorothiazide 2.5-6.25 MG tablet Commonly known as:  ZIAC Take 1 tablet by mouth daily.   CALCIUM 600 + D 600-200 MG-UNIT Tabs Generic drug:  Calcium Carb-Cholecalciferol Take 2 tablets by mouth daily.   cephALEXin 500 MG capsule Commonly known as:  KEFLEX Take 1 capsule (500 mg total) by mouth every 8 (eight) hours.   clorazepate 7.5 MG tablet Commonly known as:  TRANXENE Take 7.5 mg by mouth 2 (two) times daily as needed for anxiety.   dorzolamide-timolol 22.3-6.8 MG/ML ophthalmic solution Commonly known as:  COSOPT Place 1 drop into both eyes 2 (two) times daily.   gabapentin 100 MG capsule Commonly known as:  NEURONTIN Take 100-200 mg by mouth 2 (two) times daily as needed (pain).   HUMALOG KWIKPEN 100 UNIT/ML KwikPen Generic drug:  insulin lispro Inject 12-18 Units into the skin  3 (three) times daily.   latanoprost 0.005 % ophthalmic solution Commonly known as:  XALATAN Place 1 drop into both eyes at bedtime.   metFORMIN 500 MG 24 hr tablet Commonly known as:  GLUCOPHAGE-XR Take 500 mg by mouth 2 (two) times daily.   multivitamin capsule Take 1 capsule by mouth daily.   ONE TOUCH ULTRA TEST test strip Generic drug:  glucose blood TEST twice a day - DIAGNOSIS  CODE 250.01   OVER THE COUNTER MEDICATION Take 1 tablet by mouth daily. Focus Factor   TRESIBA FLEXTOUCH 100 UNIT/ML Sopn FlexTouch Pen Generic drug:  insulin degludec Inject 30-45 Units into the skin daily at 10 pm.   vitamin C 500 MG tablet Commonly known as:  ASCORBIC ACID Take 500 mg by mouth 2 (two) times daily.   Vitamin D-3 25 MCG (1000 UT) Caps Take 1 capsule by mouth daily.       No Known Allergies  Consultations:  General surgery   Procedures/Studies: Dg Chest 2 View  Result Date: 09/19/2018 CLINICAL DATA:  Fever and cough for 3 days. EXAM: CHEST - 2 VIEW COMPARISON:  09/23/2010 FINDINGS: Shallow inspiration. Heart size and pulmonary vascularity are normal for technique. No airspace disease or consolidation in the lungs. No blunting of costophrenic angles. No pneumothorax. Mediastinal contours appear intact. Degenerative changes in the spine and shoulders. IMPRESSION: No active cardiopulmonary disease. Electronically Signed   By: Lucienne Capers M.D.   On: 09/19/2018 21:14   Ct Head Wo Contrast  Result Date: 09/19/2018 CLINICAL DATA:  Altered mental status. Vomiting. EXAM: CT HEAD WITHOUT CONTRAST TECHNIQUE: Contiguous axial images were obtained from the base of the skull through the vertex without intravenous contrast. COMPARISON:  None. FINDINGS: Brain: Patchy low-attenuation changes in the deep white matter consistent small vessel ischemia. Minimal cerebral atrophic changes particularly for age. No mass effect or midline shift. No abnormal extra-axial fluid collections. Gray-white matter junctions are distinct. Basal cisterns are not effaced. No acute intracranial hemorrhage. Vascular: Intracranial arterial vascular calcifications are present. Skull: Calvarium appears intact. Sinuses/Orbits: Paranasal sinuses and mastoid air cells are clear. Other: None. IMPRESSION: No acute intracranial abnormalities. Mild chronic atrophic and small vessel ischemic changes.  Electronically Signed   By: Lucienne Capers M.D.   On: 09/19/2018 21:28   Ct Abdomen Pelvis W Contrast  Result Date: 09/20/2018 CLINICAL DATA:  Abdominal pain, acute, generalized, with fever. Elevated lactic acid. Nausea and vomiting. EXAM: CT ABDOMEN AND PELVIS WITH CONTRAST TECHNIQUE: Multidetector CT imaging of the abdomen and pelvis was performed using the standard protocol following bolus administration of intravenous contrast. CONTRAST:  133mL ISOVUE-300 IOPAMIDOL (ISOVUE-300) INJECTION 61% COMPARISON:  Abdominal ultrasound 09/19/2018 FINDINGS: Lower chest: The lung bases demonstrate mild dependent atelectasis bilaterally. No focal nodule, mass, or airspace disease is present. The heart size is normal. No significant pleural or pericardial effusion is present. Hepatobiliary: There is diffuse fatty infiltration liver. There is mild prominence of the gallbladder wall. There is slight stranding about the gallbladder. Fluid is present in the gallbladder fossa. The common bile duct is within normal limits. Pancreas: Unremarkable. No pancreatic ductal dilatation or surrounding inflammatory changes. Spleen: Normal in size without focal abnormality. Adrenals/Urinary Tract: Kidneys and ureters are within normal limits. There is no significant stone or mass lesion. No hydronephrosis is present. The urinary bladder is within normal limits. Stomach/Bowel: The stomach and duodenum are within normal limits. Small bowel is unremarkable. The appendix is surgically absent. The ascending and transverse colon are within normal limits.  Diverticular changes are present in the descending and sigmoid colon. Vascular/Lymphatic: Atherosclerotic changes are present in the aorta without aneurysm. No significant retroperitoneal adenopathy is present. Reproductive: Status post hysterectomy. No adnexal masses. Other: A paraumbilical hernia is up to 3 cm wide. This contains fat, but no bowel. Musculoskeletal: Grade 1 degenerative  anterolisthesis at L4-5 measures 7 mm. AP alignment is otherwise anatomic. Vertebral body heights are maintained. Moderate bilateral facet hypertrophy is present at L4-5. This contributes to moderate foraminal narrowing in addition to uncovering of a broad-based disc protrusion. IMPRESSION: 1. Mild enhancement and some inflammatory change about the gallbladder. In concert with borderline findings on ultrasound, this raises a higher concerning for developing acute cholecystitis. 2. Hepatic steatosis. 3.  Aortic Atherosclerosis (ICD10-I70.0). 4. Sigmoid diverticulosis without diverticulitis. 5. Paraumbilical hernia measuring over 3 cm contains fat, but no bowel. 6. Degenerative changes of the lumbar spine are most evident at L4-5. Electronically Signed   By: San Morelle M.D.   On: 09/20/2018 01:57   Nm Hepato W/eject Fract  Result Date: 09/20/2018 CLINICAL DATA:  Right upper quadrant pain.  Gallbladder sludge. EXAM: NUCLEAR MEDICINE HEPATOBILIARY IMAGING WITH GALLBLADDER EF TECHNIQUE: Sequential images of the abdomen were obtained out to 60 minutes following intravenous administration of radiopharmaceutical. After oral ingestion of Ensure, gallbladder ejection fraction was determined. At 60 min, normal ejection fraction is greater than 33%. RADIOPHARMACEUTICALS:  7.8 mCi Tc-29m  Choletec IV COMPARISON:  CT 09/20/2018 and ultrasound 09/19/2018. FINDINGS: Prompt uptake and biliary excretion of activity by the liver is seen. Gallbladder activity is visualized approximately 40 minutes after the administration of the radiopharmaceutical. Biliary activity passes into small bowel, consistent with patent common bile duct. Following the fatty meal dynamic imaging was carried out for an additional 60 minutes. Considerable motion artifact was noted during this portion of the exam resulting in unreliable measurements of gallbladder ejection fraction. Visually, no significant emptying of the gallbladder activity  noted. On the corresponding graft outlying gallbladder activity over time a similar amount of activity (counts) within the gallbladder at the end of 60 minutes compared with the immediately following ingestion of fatty meal. No symptoms reported after ingestion of the fatty meal. IMPRESSION: 1. Patent cystic duct without evidence for acute cholecystitis. 2. Evaluation of gallbladder ejection fraction limited due to patient motion. Subjectively, there was no significant response to fatty meal noted, suggesting biliary dysfunction. These findings should be interpreted in the appropriate clinical setting. Electronically Signed   By: Kerby Moors M.D.   On: 09/20/2018 16:56   US Abdomen Limited Ruq  Result Date: 09/19/2018 CLINICAL DATA:  Right upper quadrant abdominal pain. EXAM: ULTRASOUND ABDOMEN LIMITED RIGHT UPPER QUADRANT COMPARISON:  None. FINDINGS: Gallbladder: Mild sludge is noted. Gallbladder wall is upper limits of normal at 3 mm. There is no sonographic Murphy sign. Common bile duct: Diameter: 6 mm, upper limits of normal Liver: The liver is diffusely echogenic. No discrete lesions are present. No significant nodularity is noted. Portal vein is patent on color Doppler imaging with normal direction of blood flow towards the liver. IMPRESSION: 1. Mild sludge in the gallbladder without evidence for cholecystitis. 2. Hepatic steatosis.  No discrete liver lesions are present. Electronically Signed   By: San Morelle M.D.   On: 09/19/2018 23:10      Subjective: No complains  Discharge Exam: Vitals:   09/21/18 2300 09/22/18 0559  BP:  132/60  Pulse:  67  Resp: (!) 22 (!) 22  Temp:  97.8 F (36.6 C)  SpO2:  94%   Vitals:   09/21/18 1340 09/21/18 2118 09/21/18 2300 09/22/18 0559  BP: (!) 136/57 (!) 120/42  132/60  Pulse: 68 70  67  Resp: 20 (!) 30 (!) 22 (!) 22  Temp: (!) 97.5 F (36.4 C) 97.9 F (36.6 C)  97.8 F (36.6 C)  TempSrc: Oral Oral  Oral  SpO2: 100% 97%  94%   Weight:      Height:        General: Pt is alert, awake, not in acute distress Cardiovascular: RRR, S1/S2 +, no rubs, no gallops Respiratory: CTA bilaterally, no wheezing, no rhonchi Abdominal: Soft, NT, ND, bowel sounds + Extremities: no edema, no cyanosis    The results of significant diagnostics from this hospitalization (including imaging, microbiology, ancillary and laboratory) are listed below for reference.     Microbiology: Recent Results (from the past 240 hour(s))  Blood Culture (routine x 2)     Status: Abnormal   Collection Time: 09/19/18  8:50 PM  Result Value Ref Range Status   Specimen Description   Final    BLOOD LEFT ANTECUBITAL Performed at [facility] [address], [place], [place] [address]    Special Requests   Final    BOTTLES DRAWN AEROBIC AND ANAEROBIC Blood Culture adequate volume Performed at [facility] [address], [place], [place] [address]    Culture  Setup Time   Final    GRAM NEGATIVE RODS IN BOTH AEROBIC AND ANAEROBIC BOTTLES CRITICAL RESULT CALLED TO, READ BACK BY AND VERIFIED WITH: Seleta Rhymes PharmD 13:00 09/20/18 ([email or web address]) Performed at [facility], [address], [place], [place] [address]    Culture ESCHERICHIA COLI (A)  Final   Report Status 09/22/2018 FINAL  Final   Organism ID, Bacteria ESCHERICHIA COLI  Final      Susceptibility   Escherichia coli - MIC*    AMPICILLIN <=2 SENSITIVE Sensitive     CEFAZOLIN <=4 SENSITIVE Sensitive     CEFEPIME <=1 SENSITIVE Sensitive     CEFTAZIDIME <=1 SENSITIVE Sensitive     CEFTRIAXONE <=1 SENSITIVE Sensitive     CIPROFLOXACIN <=0.25 SENSITIVE Sensitive     GENTAMICIN <=1 SENSITIVE Sensitive     IMIPENEM <=0.25 SENSITIVE Sensitive     TRIMETH/SULFA <=20 SENSITIVE Sensitive     AMPICILLIN/SULBACTAM <=2 SENSITIVE Sensitive     PIP/TAZO <=4 SENSITIVE Sensitive     Extended ESBL NEGATIVE Sensitive     * ESCHERICHIA COLI  Blood Culture  (routine x 2)     Status: Abnormal   Collection Time: 09/19/18  8:50 PM  Result Value Ref Range Status   Specimen Description   Final    BLOOD RIGHT FOREARM Performed at [facility] [address] [place], [place] [address]    Special Requests   Final    BOTTLES DRAWN AEROBIC AND ANAEROBIC Blood Culture adequate volume Performed at [facility] [address], [place], [place] [address]    Culture  Setup Time   Final    GRAM NEGATIVE RODS AEROBIC BOTTLE ONLY CRITICAL VALUE NOTED.  VALUE IS CONSISTENT WITH PREVIOUSLY REPORTED AND CALLED VALUE.    Culture (A)  Final    ESCHERICHIA COLI SUSCEPTIBILITIES PERFORMED ON PREVIOUS CULTURE WITHIN THE LAST 5 DAYS. Performed at [facility], [place] [address] [place], [place] [address]    Report Status 09/22/2018 FINAL  Final  Blood Culture ID Panel (Reflexed)  Status: Abnormal   Collection Time: 09/19/18  8:50 PM  Result Value Ref Range Status   Enterococcus species NOT DETECTED NOT DETECTED Final   Listeria monocytogenes NOT DETECTED NOT DETECTED Final   Staphylococcus species NOT DETECTED NOT DETECTED Final   Staphylococcus aureus (BCID) NOT DETECTED NOT DETECTED Final   Streptococcus species NOT DETECTED NOT DETECTED Final   Streptococcus agalactiae NOT DETECTED NOT DETECTED Final   Streptococcus pneumoniae NOT DETECTED NOT DETECTED Final   Streptococcus pyogenes NOT DETECTED NOT DETECTED Final   Acinetobacter baumannii NOT DETECTED NOT DETECTED Final   Enterobacteriaceae species DETECTED (A) NOT DETECTED Final    Comment: Enterobacteriaceae represent a large family of gram-negative bacteria, not a single organism. CRITICAL RESULT CALLED TO, READ BACK BY AND VERIFIED WITH: Seleta Rhymes PharmD 13:00 09/20/18 ([email or web address])    Enterobacter cloacae complex NOT DETECTED NOT DETECTED Final   Escherichia coli DETECTED (A) NOT DETECTED Final    Comment: CRITICAL RESULT CALLED TO, READ BACK BY AND  VERIFIED WITH: Seleta Rhymes PharmD 13:00 09/20/18 ([email or web address])    Klebsiella oxytoca NOT DETECTED NOT DETECTED Final   Klebsiella pneumoniae NOT DETECTED NOT DETECTED Final   Proteus species NOT DETECTED NOT DETECTED Final   Serratia marcescens NOT DETECTED NOT DETECTED Final   Carbapenem resistance NOT DETECTED NOT DETECTED Final   Haemophilus influenzae NOT DETECTED NOT DETECTED Final   Neisseria meningitidis NOT DETECTED NOT DETECTED Final   Pseudomonas aeruginosa NOT DETECTED NOT DETECTED Final   Candida albicans NOT DETECTED NOT DETECTED Final   Candida glabrata NOT DETECTED NOT DETECTED Final   Candida krusei NOT DETECTED NOT DETECTED Final   Candida parapsilosis NOT DETECTED NOT DETECTED Final   Candida tropicalis NOT DETECTED NOT DETECTED Final    Comment: Performed at [facility], [place] [address] [place], [place] [address]  Urine culture     Status: None   Collection Time: 09/19/18 11:05 PM  Result Value Ref Range Status   Specimen Description   Final    URINE, RANDOM Performed at [facility] [address], [place], [place] [address]    Special Requests   Final    NONE Performed at [facility], [place] [address], [place], [place] [address]    Culture   Final    NO GROWTH Performed at [facility], [place] [address] [place], [place] [address]    Report Status 09/21/2018 FINAL  Final  Surgical pcr screen     Status: None   Collection Time: 09/21/18  5:02 AM  Result Value Ref Range Status   MRSA, PCR NEGATIVE NEGATIVE Final   Staphylococcus aureus NEGATIVE NEGATIVE Final    Comment: (NOTE) The Xpert SA Assay (FDA approved for NASAL specimens in patients 36 years of age and older), is one component of a comprehensive surveillance program. It is not intended to diagnose infection nor to guide or monitor treatment. Performed at [facility], [place] [address], [place],  [address]      Labs: BNP  (last 3 results) No results for input(s): BNP in the last 8760 hours. Basic Metabolic Panel: Recent Labs  Lab 09/19/18 2050 09/20/18 0549  NA 139 143  K 3.7 3.8  CL 102 110  CO2 25 23  GLUCOSE 228* 144*  BUN 16 13  CREATININE 1.05* 0.97  CALCIUM 9.6 8.7*   Liver Function Tests: Recent Labs  Lab 09/19/18 2050 09/20/18 0549  AST 425* 193*  ALT 238* 196*  ALKPHOS 80 75  BILITOT 1.6* 2.1*  PROT 7.5 6.6  ALBUMIN 4.1 3.4*   Recent Labs  Lab 09/19/18 2046  LIPASE 35   No results for input(s): AMMONIA in the last 168 hours. CBC: Recent Labs  Lab 09/19/18 2050 09/20/18 0549 09/21/18 0456 09/22/18 0528  WBC 6.7 9.0 4.9 3.3*  NEUTROABS 5.9  --   --   --   HGB 12.7 11.8* 11.3* 11.4*  HCT 40.2 37.3 36.3 36.0  MCV 85.4 85.2 85.4 84.5  PLT 271 237 226 261   Cardiac Enzymes: No results for input(s): CKTOTAL, CKMB, CKMBINDEX, TROPONINI in the last 168 hours. BNP: Invalid input(s): POCBNP CBG: Recent Labs  Lab 09/21/18 0812 09/21/18 1153 09/21/18 1652 09/21/18 2114 09/22/18 0752  GLUCAP 140* 259* 218* 263* 184*   D-Dimer No results for input(s): DDIMER in the last 72 hours. Hgb A1c Recent Labs    09/20/18 0549 09/21/18 1004  HGBA1C 8.3* 8.2*   Lipid Profile No results for input(s): CHOL, HDL, LDLCALC, TRIG, CHOLHDL, LDLDIRECT in the last 72 hours. Thyroid function studies No results for input(s): TSH, T4TOTAL, T3FREE, THYROIDAB in the last 72 hours.  Invalid input(s): FREET3 Anemia work up No results for input(s): VITAMINB12, FOLATE, FERRITIN, TIBC, IRON, RETICCTPCT in the last 72 hours. Urinalysis    Component Value Date/Time   COLORURINE YELLOW 09/19/2018 2305   APPEARANCEUR CLEAR 09/19/2018 2305   LABSPEC 1.011 09/19/2018 2305   PHURINE 7.0 09/19/2018 2305   GLUCOSEU 150 (A) 09/19/2018 2305   HGBUR NEGATIVE 09/19/2018 2305   BILIRUBINUR NEGATIVE 09/19/2018 2305   KETONESUR NEGATIVE 09/19/2018 2305   PROTEINUR NEGATIVE 09/19/2018 2305    UROBILINOGEN 0.2 05/28/2014 0850   NITRITE NEGATIVE 09/19/2018 2305   LEUKOCYTESUR NEGATIVE 09/19/2018 2305   Sepsis Labs Invalid input(s): PROCALCITONIN,  WBC,  LACTICIDVEN Microbiology Recent Results (from the past 240 hour(s))  Blood Culture (routine x 2)     Status: Abnormal   Collection Time: 09/19/18  8:50 PM  Result Value Ref Range Status   Specimen Description   Final    BLOOD LEFT ANTECUBITAL Performed at [facility], [place] [address], [place], [place] [address]    Special Requests   Final    BOTTLES DRAWN AEROBIC AND ANAEROBIC Blood Culture adequate volume Performed at [facility] [address], [place], [place] [address]    Culture  Setup Time   Final    GRAM NEGATIVE RODS IN BOTH AEROBIC AND ANAEROBIC BOTTLES CRITICAL RESULT CALLED TO, READ BACK BY AND VERIFIED WITH: Seleta Rhymes PharmD 13:00 09/20/18 ([email or web address]) Performed at [facility], [address], [place], [place] [address]    Culture ESCHERICHIA COLI (A)  Final   Report Status 09/22/2018 FINAL  Final   Organism ID, Bacteria ESCHERICHIA COLI  Final      Susceptibility   Escherichia coli - MIC*    AMPICILLIN <=2 SENSITIVE Sensitive     CEFAZOLIN <=4 SENSITIVE Sensitive     CEFEPIME <=1 SENSITIVE Sensitive     CEFTAZIDIME <=1 SENSITIVE Sensitive     CEFTRIAXONE <=1 SENSITIVE Sensitive     CIPROFLOXACIN <=0.25 SENSITIVE Sensitive     GENTAMICIN <=1 SENSITIVE Sensitive     IMIPENEM <=0.25 SENSITIVE Sensitive     TRIMETH/SULFA <=20 SENSITIVE Sensitive     AMPICILLIN/SULBACTAM <=2 SENSITIVE Sensitive     PIP/TAZO <=4 SENSITIVE Sensitive     Extended ESBL NEGATIVE Sensitive     * ESCHERICHIA COLI  Blood Culture (  routine x 2)     Status: Abnormal   Collection Time: 09/19/18  8:50 PM  Result Value Ref Range Status   Specimen Description   Final    BLOOD RIGHT FOREARM Performed at [facility] [address], [place], [place] [address]    Special  Requests   Final    BOTTLES DRAWN AEROBIC AND ANAEROBIC Blood Culture adequate volume Performed at [facility] [address], [place], [place] [address]    Culture  Setup Time   Final    GRAM NEGATIVE RODS AEROBIC BOTTLE ONLY CRITICAL VALUE NOTED.  VALUE IS CONSISTENT WITH PREVIOUSLY REPORTED AND CALLED VALUE.    Culture (A)  Final    ESCHERICHIA COLI SUSCEPTIBILITIES PERFORMED ON PREVIOUS CULTURE WITHIN THE LAST 5 DAYS. Performed at [facility], [place] [address], [place], [place] [address]    Report Status 09/22/2018 FINAL  Final  Blood Culture ID Panel (Reflexed)     Status: Abnormal   Collection Time: 09/19/18  8:50 PM  Result Value Ref Range Status   Enterococcus species NOT DETECTED NOT DETECTED Final   Listeria monocytogenes NOT DETECTED NOT DETECTED Final   Staphylococcus species NOT DETECTED NOT DETECTED Final   Staphylococcus aureus (BCID) NOT DETECTED NOT DETECTED Final   Streptococcus species NOT DETECTED NOT DETECTED Final   Streptococcus agalactiae NOT DETECTED NOT DETECTED Final   Streptococcus pneumoniae NOT DETECTED NOT DETECTED Final   Streptococcus pyogenes NOT DETECTED NOT DETECTED Final   Acinetobacter baumannii NOT DETECTED NOT DETECTED Final   Enterobacteriaceae species DETECTED (A) NOT DETECTED Final    Comment: Enterobacteriaceae represent a large family of gram-negative bacteria, not a single organism. CRITICAL RESULT CALLED TO, READ BACK BY AND VERIFIED WITH: Seleta Rhymes PharmD 13:00 09/20/18 ([email or web address])    Enterobacter cloacae complex NOT DETECTED NOT DETECTED Final   Escherichia coli DETECTED (A) NOT DETECTED Final    Comment: CRITICAL RESULT CALLED TO, READ BACK BY AND VERIFIED WITH: Seleta Rhymes PharmD 13:00 09/20/18 ([email or web address])    Klebsiella oxytoca NOT DETECTED NOT DETECTED Final   Klebsiella pneumoniae NOT DETECTED NOT DETECTED Final   Proteus species NOT DETECTED NOT DETECTED Final   Serratia marcescens NOT DETECTED NOT  DETECTED Final   Carbapenem resistance NOT DETECTED NOT DETECTED Final   Haemophilus influenzae NOT DETECTED NOT DETECTED Final   Neisseria meningitidis NOT DETECTED NOT DETECTED Final   Pseudomonas aeruginosa NOT DETECTED NOT DETECTED Final   Candida albicans NOT DETECTED NOT DETECTED Final   Candida glabrata NOT DETECTED NOT DETECTED Final   Candida krusei NOT DETECTED NOT DETECTED Final   Candida parapsilosis NOT DETECTED NOT DETECTED Final   Candida tropicalis NOT DETECTED NOT DETECTED Final    Comment: Performed at [facility], [place] [address], [place], [place] [address]  Urine culture     Status: None   Collection Time: 09/19/18 11:05 PM  Result Value Ref Range Status   Specimen Description   Final    URINE, RANDOM Performed at [facility] [address] [place], [place] [address]    Special Requests   Final    NONE Performed at [facility], [place] [address], [place], [place] [address]    Culture   Final    NO GROWTH Performed at [facility], [place] [address], [place], [place] [address]    Report Status 09/21/2018 FINAL  Final  Surgical pcr screen     Status: None  Collection Time: 09/21/18  5:02 AM  Result Value Ref Range Status   MRSA, PCR NEGATIVE NEGATIVE Final   Staphylococcus aureus NEGATIVE NEGATIVE Final    Comment: (NOTE) The Xpert SA Assay (FDA approved for NASAL specimens in patients 72 years of age and older), is one component of a comprehensive surveillance program. It is not intended to diagnose infection nor to guide or monitor treatment. Performed at [facility], [place] [address], [place], [place] [address]      Time coordinating discharge: 40 minutes  SIGNED:   Charlynne Cousins, MD  [facility] 09/22/2018, 9:11 AM Pager   If 7PM-7AM, please contact night-coverage [email or web address] Password [email or web address]

## 2018-09-22 NOTE — Plan of Care (Signed)
Patient discharged home in stable condition. Rx given. IV dc'd. Discharge instructions given to patient and her daughter, both verbalized understanding.

## 2018-10-31 DIAGNOSIS — E1165 Type 2 diabetes mellitus with hyperglycemia: Secondary | ICD-10-CM | POA: Diagnosis not present

## 2018-10-31 DIAGNOSIS — E1140 Type 2 diabetes mellitus with diabetic neuropathy, unspecified: Secondary | ICD-10-CM | POA: Diagnosis not present

## 2018-10-31 DIAGNOSIS — E7800 Pure hypercholesterolemia, unspecified: Secondary | ICD-10-CM | POA: Diagnosis not present

## 2018-10-31 DIAGNOSIS — I10 Essential (primary) hypertension: Secondary | ICD-10-CM | POA: Diagnosis not present

## 2018-11-05 DIAGNOSIS — E118 Type 2 diabetes mellitus with unspecified complications: Secondary | ICD-10-CM | POA: Diagnosis not present

## 2018-11-05 DIAGNOSIS — Z794 Long term (current) use of insulin: Secondary | ICD-10-CM | POA: Diagnosis not present

## 2018-11-12 DIAGNOSIS — R1011 Right upper quadrant pain: Secondary | ICD-10-CM | POA: Diagnosis not present

## 2018-11-12 DIAGNOSIS — K828 Other specified diseases of gallbladder: Secondary | ICD-10-CM | POA: Diagnosis not present

## 2018-11-14 DIAGNOSIS — R1011 Right upper quadrant pain: Secondary | ICD-10-CM | POA: Diagnosis not present

## 2018-12-06 DIAGNOSIS — Z794 Long term (current) use of insulin: Secondary | ICD-10-CM | POA: Diagnosis not present

## 2018-12-06 DIAGNOSIS — E118 Type 2 diabetes mellitus with unspecified complications: Secondary | ICD-10-CM | POA: Diagnosis not present

## 2019-01-06 DIAGNOSIS — E118 Type 2 diabetes mellitus with unspecified complications: Secondary | ICD-10-CM | POA: Diagnosis not present

## 2019-01-06 DIAGNOSIS — Z794 Long term (current) use of insulin: Secondary | ICD-10-CM | POA: Diagnosis not present

## 2019-02-04 DIAGNOSIS — Z794 Long term (current) use of insulin: Secondary | ICD-10-CM | POA: Diagnosis not present

## 2019-02-04 DIAGNOSIS — E118 Type 2 diabetes mellitus with unspecified complications: Secondary | ICD-10-CM | POA: Diagnosis not present

## 2019-03-04 DIAGNOSIS — M542 Cervicalgia: Secondary | ICD-10-CM | POA: Diagnosis not present

## 2019-03-04 DIAGNOSIS — M2578 Osteophyte, vertebrae: Secondary | ICD-10-CM | POA: Diagnosis not present

## 2019-03-04 DIAGNOSIS — R410 Disorientation, unspecified: Secondary | ICD-10-CM | POA: Diagnosis not present

## 2019-03-04 DIAGNOSIS — M47812 Spondylosis without myelopathy or radiculopathy, cervical region: Secondary | ICD-10-CM | POA: Diagnosis not present

## 2019-03-07 DIAGNOSIS — Z794 Long term (current) use of insulin: Secondary | ICD-10-CM | POA: Diagnosis not present

## 2019-03-07 DIAGNOSIS — E118 Type 2 diabetes mellitus with unspecified complications: Secondary | ICD-10-CM | POA: Diagnosis not present

## 2019-04-06 DIAGNOSIS — Z794 Long term (current) use of insulin: Secondary | ICD-10-CM | POA: Diagnosis not present

## 2019-04-06 DIAGNOSIS — E118 Type 2 diabetes mellitus with unspecified complications: Secondary | ICD-10-CM | POA: Diagnosis not present

## 2019-04-15 DIAGNOSIS — E1165 Type 2 diabetes mellitus with hyperglycemia: Secondary | ICD-10-CM | POA: Diagnosis not present

## 2019-04-15 DIAGNOSIS — E7800 Pure hypercholesterolemia, unspecified: Secondary | ICD-10-CM | POA: Diagnosis not present

## 2019-04-22 DIAGNOSIS — E1165 Type 2 diabetes mellitus with hyperglycemia: Secondary | ICD-10-CM | POA: Diagnosis not present

## 2019-04-22 DIAGNOSIS — E1140 Type 2 diabetes mellitus with diabetic neuropathy, unspecified: Secondary | ICD-10-CM | POA: Diagnosis not present

## 2019-04-22 DIAGNOSIS — Z23 Encounter for immunization: Secondary | ICD-10-CM | POA: Diagnosis not present

## 2019-04-22 DIAGNOSIS — E7800 Pure hypercholesterolemia, unspecified: Secondary | ICD-10-CM | POA: Diagnosis not present

## 2019-04-22 DIAGNOSIS — I10 Essential (primary) hypertension: Secondary | ICD-10-CM | POA: Diagnosis not present

## 2019-05-12 DIAGNOSIS — E118 Type 2 diabetes mellitus with unspecified complications: Secondary | ICD-10-CM | POA: Diagnosis not present

## 2019-05-12 DIAGNOSIS — Z794 Long term (current) use of insulin: Secondary | ICD-10-CM | POA: Diagnosis not present

## 2019-06-11 DIAGNOSIS — Z794 Long term (current) use of insulin: Secondary | ICD-10-CM | POA: Diagnosis not present

## 2019-06-11 DIAGNOSIS — E118 Type 2 diabetes mellitus with unspecified complications: Secondary | ICD-10-CM | POA: Diagnosis not present

## 2019-07-12 DIAGNOSIS — E118 Type 2 diabetes mellitus with unspecified complications: Secondary | ICD-10-CM | POA: Diagnosis not present

## 2019-07-12 DIAGNOSIS — Z794 Long term (current) use of insulin: Secondary | ICD-10-CM | POA: Diagnosis not present

## 2019-07-22 DIAGNOSIS — H401124 Primary open-angle glaucoma, left eye, indeterminate stage: Secondary | ICD-10-CM | POA: Diagnosis not present

## 2019-07-22 DIAGNOSIS — E119 Type 2 diabetes mellitus without complications: Secondary | ICD-10-CM | POA: Diagnosis not present

## 2019-07-22 DIAGNOSIS — H401111 Primary open-angle glaucoma, right eye, mild stage: Secondary | ICD-10-CM | POA: Diagnosis not present

## 2019-08-04 DIAGNOSIS — E1165 Type 2 diabetes mellitus with hyperglycemia: Secondary | ICD-10-CM | POA: Diagnosis not present

## 2019-08-04 DIAGNOSIS — I10 Essential (primary) hypertension: Secondary | ICD-10-CM | POA: Diagnosis not present

## 2019-08-04 DIAGNOSIS — Z0000 Encounter for general adult medical examination without abnormal findings: Secondary | ICD-10-CM | POA: Diagnosis not present

## 2019-08-04 DIAGNOSIS — E7800 Pure hypercholesterolemia, unspecified: Secondary | ICD-10-CM | POA: Diagnosis not present

## 2019-08-07 DIAGNOSIS — Z794 Long term (current) use of insulin: Secondary | ICD-10-CM | POA: Diagnosis not present

## 2019-08-07 DIAGNOSIS — E118 Type 2 diabetes mellitus with unspecified complications: Secondary | ICD-10-CM | POA: Diagnosis not present

## 2019-08-11 DIAGNOSIS — K219 Gastro-esophageal reflux disease without esophagitis: Secondary | ICD-10-CM | POA: Diagnosis not present

## 2019-08-11 DIAGNOSIS — I10 Essential (primary) hypertension: Secondary | ICD-10-CM | POA: Diagnosis not present

## 2019-08-11 DIAGNOSIS — M549 Dorsalgia, unspecified: Secondary | ICD-10-CM | POA: Diagnosis not present

## 2019-08-11 DIAGNOSIS — M546 Pain in thoracic spine: Secondary | ICD-10-CM | POA: Diagnosis not present

## 2019-08-11 DIAGNOSIS — R109 Unspecified abdominal pain: Secondary | ICD-10-CM | POA: Diagnosis not present

## 2019-08-11 DIAGNOSIS — Z0000 Encounter for general adult medical examination without abnormal findings: Secondary | ICD-10-CM | POA: Diagnosis not present

## 2019-08-26 DIAGNOSIS — E7800 Pure hypercholesterolemia, unspecified: Secondary | ICD-10-CM | POA: Diagnosis not present

## 2019-08-26 DIAGNOSIS — I10 Essential (primary) hypertension: Secondary | ICD-10-CM | POA: Diagnosis not present

## 2019-08-26 DIAGNOSIS — E1165 Type 2 diabetes mellitus with hyperglycemia: Secondary | ICD-10-CM | POA: Diagnosis not present

## 2019-08-26 DIAGNOSIS — E1140 Type 2 diabetes mellitus with diabetic neuropathy, unspecified: Secondary | ICD-10-CM | POA: Diagnosis not present

## 2019-09-06 DIAGNOSIS — Z794 Long term (current) use of insulin: Secondary | ICD-10-CM | POA: Diagnosis not present

## 2019-09-06 DIAGNOSIS — E118 Type 2 diabetes mellitus with unspecified complications: Secondary | ICD-10-CM | POA: Diagnosis not present

## 2019-10-07 DIAGNOSIS — Z794 Long term (current) use of insulin: Secondary | ICD-10-CM | POA: Diagnosis not present

## 2019-10-07 DIAGNOSIS — E118 Type 2 diabetes mellitus with unspecified complications: Secondary | ICD-10-CM | POA: Diagnosis not present

## 2019-10-16 ENCOUNTER — Ambulatory Visit: Payer: Medicare Other | Admitting: Podiatry

## 2019-10-17 ENCOUNTER — Ambulatory Visit: Payer: Medicare Other | Admitting: Podiatry

## 2019-10-23 ENCOUNTER — Ambulatory Visit: Payer: Medicare Other | Admitting: Podiatry

## 2019-10-30 ENCOUNTER — Other Ambulatory Visit: Payer: Self-pay

## 2019-10-30 ENCOUNTER — Ambulatory Visit: Payer: Self-pay

## 2019-10-30 ENCOUNTER — Encounter: Payer: Self-pay | Admitting: Orthopaedic Surgery

## 2019-10-30 ENCOUNTER — Ambulatory Visit (INDEPENDENT_AMBULATORY_CARE_PROVIDER_SITE_OTHER): Payer: Medicare Other | Admitting: Orthopaedic Surgery

## 2019-10-30 VITALS — Ht 63.0 in | Wt 173.0 lb

## 2019-10-30 DIAGNOSIS — M545 Low back pain, unspecified: Secondary | ICD-10-CM

## 2019-10-30 DIAGNOSIS — M79604 Pain in right leg: Secondary | ICD-10-CM

## 2019-10-30 DIAGNOSIS — M79605 Pain in left leg: Secondary | ICD-10-CM | POA: Diagnosis not present

## 2019-10-30 NOTE — Progress Notes (Signed)
Office Visit Note   Patient: [name]           Date of Birth: [birth year]           MRN: [id number] Visit Date: 10/30/2019              Requested by: Janie Morning, DO [place],  [place] [address] PCP: Janie Morning, DO   Assessment & Plan: Visit Diagnoses:  1. Bilateral leg pain   2. Right low back pain, unspecified chronicity, unspecified whether sciatica present     Plan:  #1: We will schedule her for an MRI scan lumbar spine looking for pathology that may produce the pain in the right lower extremity. #2: Follow back up after MRI scan.  Follow-Up Instructions: Return in about 2 weeks (around 11/13/2019).   Orders:  Orders Placed This Encounter  Procedures  . XR Pelvis 1-2 Views  . XR Lumbar Spine 2-3 Views  . MR Lumbar Spine w/o contrast   No orders of the defined types were placed in this encounter.     Procedures: No procedures performed   Clinical Data: No additional findings.   Subjective: Chief Complaint  Patient presents with  . Right Leg - Pain  . Left Leg - Pain  Patient presents today with bilateral leg pain. She said that it has been hurting for a month. No known injury. Her right side is worse than the left. She said that she has pain in her lower legs and up into her buttock on both sides. No groin pain. She has had some tingling in the right side. No numbness. She isn't taking anything for pain except Tylenol.   She states that she is had about a months worth of pain that started in the calf laterally of the right leg.  It went down to the plantar aspect of the foot.  Then she started having pain up and along the lateral aspect of the hip into the buttock area.  She states her symptoms today are not there.  Denies any bowel or bladder incontinence.  No previous injuries.  HPI  Review of Systems  Constitutional: Negative for fatigue.  HENT: Negative for ear pain.   Eyes: Negative for pain.  Respiratory:  Negative for shortness of breath.   Cardiovascular: Negative for leg swelling.  Gastrointestinal: Negative for constipation and diarrhea.  Endocrine: Negative for cold intolerance and heat intolerance.  Genitourinary: Negative for difficulty urinating.  Musculoskeletal: Negative for joint swelling.  Skin: Negative for rash.  Allergic/Immunologic: Negative for food allergies.  Neurological: Negative for weakness.  Hematological: Does not bruise/bleed easily.  Psychiatric/Behavioral: Positive for sleep disturbance.     Objective: Vital Signs: Ht 5\' 3"  (1.6 m)   Wt 173 lb (78.5 kg)   BMI 30.65 kg/m   Physical Exam Constitutional:      Appearance: Normal appearance. She is well-developed.  Eyes:     Pupils: Pupils are equal, round, and reactive to light.  Pulmonary:     Effort: Pulmonary effort is normal.  Skin:    General: Skin is warm and dry.  Neurological:     Mental Status: She is alert and oriented to person, place, and time.  Psychiatric:        Behavior: Behavior normal.     Ortho Exam  Exam today reveals good motion of her ankles knees and hips.  She does have good strength in lower extremities bilateral and symmetric in all fields tested.  Deep tendon reflexes are absent in the lower extremities bilateral and symmetric.  Negative straight leg raising.  Calves are supple and nontender.  Unable to obtain any dorsalis pedis pulse.  Questionable posterior tib palpable.  Bilateral.  Specialty Comments:  No specialty comments available.  Imaging: XR Lumbar Spine 2-3 Views  Result Date: 10/30/2019 2 view x-ray lumbar spine reveals a grade 1 spondylolisthesis at L4-5.  Degenerative disc disease noted at 4 5 also.  She does have some mild scoliotic deformity of the lumbosacral spine.  There is some prominence at the inferior aspect of the SI joints noted.  XR Pelvis 1-2 Views  Result Date: 10/30/2019 X-ray of the pelvis reveals hips to be in good position.  The SI  joints do have some prominence inferiorly on the AP.    PMFS History: Patient Active Problem List   Diagnosis Date Noted  . Low back pain 10/30/2019  . Uterine prolapse without vaginal wall prolapse 09/20/2018  . History of cystocele 09/20/2018  . Anemia 09/20/2018  . Fever, unspecified 09/20/2018  . Abdominal pain, epigastric 09/20/2018  . Nausea with vomiting 09/20/2018  . Recurrent ventral incisional hernia 09/20/2018  . Sepsis due to Gram negative bacteria ([place]) 09/20/2018  . CKD (chronic kidney disease) stage 3, GFR 30-59 ml/min 09/20/2018  . Anxiety 09/20/2018  . Diabetic neuropathy ([place]) 09/20/2018  . E coli bacteremia 09/20/2018  . Sepsis ([place]) 09/19/2018  . Palpitations 03/12/2017  . Prolapse of anterior vaginal wall 06/03/2014  . Chest pressure 09/16/2013  . GERD (gastroesophageal reflux disease) 09/16/2013  . Pain in limb 03/16/2008  . Type 1 diabetes, controlled, with neuropathy ([place]) 08/13/2007  . Hyperlipidemia 08/13/2007  . HTN (hypertension) 08/13/2007  . INSOMNIA 08/13/2007  . WEIGHT GAIN 08/13/2007   Past Medical History:  Diagnosis Date  . Diabetic neuropathy ([place])   . GERD (gastroesophageal reflux disease)   . HYPERLIPIDEMIA 08/13/2007  . HYPERTENSION 08/13/2007  . INSOMNIA 08/13/2007  . Palpitations   . SVD (spontaneous vaginal delivery)    x 2  . Type 2 DM with ketoacidosis ([place]) 08/13/2007    Family History  Problem Relation Age of Onset  . Kidney disease Brother   . Diabetes Brother   . Colon cancer Brother   . Diabetes Mother   . Diabetes Father   . Heart Problems Father   . Kidney disease Sister   . Colon cancer Sister   . Diabetes Sister   . Multiple sclerosis Child   . Hypertension Child        x2  . Diabetes Child        x2    Past Surgical History:  Procedure Laterality Date  . ANTERIOR AND POSTERIOR REPAIR N/A 06/03/2014   Procedure: ANTERIOR (CYSTOCELE) AND POSTERIOR REPAIR (RECTOCELE);  Surgeon: Melina Schools, MD;   Location: [facility];  Service: Gynecology;  Laterality: N/A;  . COLONOSCOPY    . DILATION AND CURETTAGE OF UTERUS    . EYE SURGERY     bilateral cataracts  . LAPAROSCOPIC APPENDECTOMY  2004   Dr Lindwood Qua  . right hand surgery    . Stress Cardiolite  02/19/2007  . UMBILICAL HERNIA REPAIR  2004   Primary repair at time of appendectomy.  Dr Deon Pilling  . VAGINAL HYSTERECTOMY N/A 06/03/2014   Procedure: HYSTERECTOMY VAGINAL;  Surgeon: Melina Schools, MD;  Location: [facility];  Service: Gynecology;  Laterality: N/A;  2hrs OR time   Social History   Occupational History  .  Not on file  Tobacco Use  . Smoking status: Never Smoker  . Smokeless tobacco: Never Used  Substance and Sexual Activity  . Alcohol use: No  . Drug use: No  . Sexual activity: Yes    Birth control/protection: Post-menopausal

## 2019-11-11 DIAGNOSIS — U071 COVID-19: Secondary | ICD-10-CM | POA: Diagnosis not present

## 2019-11-11 DIAGNOSIS — Z794 Long term (current) use of insulin: Secondary | ICD-10-CM | POA: Diagnosis not present

## 2019-11-11 DIAGNOSIS — E118 Type 2 diabetes mellitus with unspecified complications: Secondary | ICD-10-CM | POA: Diagnosis not present

## 2019-11-20 ENCOUNTER — Other Ambulatory Visit: Payer: Medicare Other

## 2019-12-12 DIAGNOSIS — E118 Type 2 diabetes mellitus with unspecified complications: Secondary | ICD-10-CM | POA: Diagnosis not present

## 2019-12-12 DIAGNOSIS — Z8616 Personal history of covid-19: Secondary | ICD-10-CM | POA: Diagnosis not present

## 2019-12-12 DIAGNOSIS — I10 Essential (primary) hypertension: Secondary | ICD-10-CM | POA: Diagnosis not present

## 2019-12-12 DIAGNOSIS — Z794 Long term (current) use of insulin: Secondary | ICD-10-CM | POA: Diagnosis not present

## 2019-12-22 DIAGNOSIS — H401134 Primary open-angle glaucoma, bilateral, indeterminate stage: Secondary | ICD-10-CM | POA: Diagnosis not present

## 2019-12-22 DIAGNOSIS — E119 Type 2 diabetes mellitus without complications: Secondary | ICD-10-CM | POA: Diagnosis not present

## 2019-12-22 DIAGNOSIS — H02051 Trichiasis without entropian right upper eyelid: Secondary | ICD-10-CM | POA: Diagnosis not present

## 2019-12-22 DIAGNOSIS — H524 Presbyopia: Secondary | ICD-10-CM | POA: Diagnosis not present

## 2019-12-25 DIAGNOSIS — E1165 Type 2 diabetes mellitus with hyperglycemia: Secondary | ICD-10-CM | POA: Diagnosis not present

## 2019-12-25 DIAGNOSIS — I10 Essential (primary) hypertension: Secondary | ICD-10-CM | POA: Diagnosis not present

## 2019-12-25 DIAGNOSIS — E7800 Pure hypercholesterolemia, unspecified: Secondary | ICD-10-CM | POA: Diagnosis not present

## 2019-12-31 DIAGNOSIS — E7800 Pure hypercholesterolemia, unspecified: Secondary | ICD-10-CM | POA: Diagnosis not present

## 2019-12-31 DIAGNOSIS — E1165 Type 2 diabetes mellitus with hyperglycemia: Secondary | ICD-10-CM | POA: Diagnosis not present

## 2019-12-31 DIAGNOSIS — E1140 Type 2 diabetes mellitus with diabetic neuropathy, unspecified: Secondary | ICD-10-CM | POA: Diagnosis not present

## 2019-12-31 DIAGNOSIS — I10 Essential (primary) hypertension: Secondary | ICD-10-CM | POA: Diagnosis not present

## 2020-01-11 DIAGNOSIS — E118 Type 2 diabetes mellitus with unspecified complications: Secondary | ICD-10-CM | POA: Diagnosis not present

## 2020-01-11 DIAGNOSIS — Z794 Long term (current) use of insulin: Secondary | ICD-10-CM | POA: Diagnosis not present

## 2020-01-30 ENCOUNTER — Other Ambulatory Visit: Payer: Self-pay

## 2020-01-30 ENCOUNTER — Ambulatory Visit (INDEPENDENT_AMBULATORY_CARE_PROVIDER_SITE_OTHER): Payer: Medicare Other | Admitting: Internal Medicine

## 2020-01-30 ENCOUNTER — Encounter: Payer: Self-pay | Admitting: Internal Medicine

## 2020-01-30 VITALS — BP 132/82 | HR 67 | Temp 97.6°F | Ht 64.0 in | Wt 166.0 lb

## 2020-01-30 DIAGNOSIS — E782 Mixed hyperlipidemia: Secondary | ICD-10-CM

## 2020-01-30 DIAGNOSIS — E1040 Type 1 diabetes mellitus with diabetic neuropathy, unspecified: Secondary | ICD-10-CM | POA: Diagnosis not present

## 2020-01-30 DIAGNOSIS — I10 Essential (primary) hypertension: Secondary | ICD-10-CM

## 2020-01-30 NOTE — Patient Instructions (Signed)
Medication Instructions:  Your physician recommends that you continue on your current medications as directed. Please refer to the Current Medication list given to you today.  *If you need a refill on your cardiac medications before your next appointment, please call your pharmacy*   Lab Work: FASTING lab work in 3 months to check cholesterol   If you have labs (blood work) drawn today and your tests are completely normal, you will receive your results only by: Marland Kitchen MyChart Message (if you have MyChart) OR . A paper copy in the mail If you have any lab test that is abnormal or we need to change your treatment, we will call you to review the results.   Testing/Procedures: NONE   Follow-Up: At [facility], you and your health needs are our priority.  As part of our continuing mission to provide you with exceptional heart care, we have created designated Provider Care Teams.  These Care Teams include your primary Cardiologist (physician) and Advanced Practice Providers (APPs -  Physician Assistants and Nurse Practitioners) who all work together to provide you with the care you need, when you need it.  We recommend signing up for the patient portal called "MyChart".  Sign up information is provided on this After Visit Summary.  MyChart is used to connect with patients for Virtual Visits (Telemedicine).  Patients are able to view lab/test results, encounter notes, upcoming appointments, etc.  Non-urgent messages can be sent to your provider as well.   To learn more about what you can do with MyChart, go to [email or web address].    Your next appointment:   12 month(s)  The format for your next appointment:   In Person  Provider:   You may see Dr. Debara Pickett or one of the following Advanced Practice Providers on your designated Care Team:    Almyra Deforest, PA-C  Fabian Sharp, [place] or   Roby Lofts, [place]    Other Instructions  [place]

## 2020-01-30 NOTE — Progress Notes (Signed)
OFFICE NOTE  Chief Complaint:  No complaints  Primary Care Physician: Janie Morning, DO  HPI:  [name] is a pleasant 84 year old female referred to me by Dr. Karlton Lemon. She is a past medical history significant for diabetes on insulin, peripheral neuropathy secondary to diabetes, dyslipidemia and hypertension. Over the past several weeks she's noted some chest pressure which she says starts in the upper abdominal region and is more substernal. She had one episode that occurred when she was walking on a treadmill but improved somewhat after she stopped and rested. She has had several other episodes not associated with exercise however sometimes the symptoms are associated with a pressure feeling that feels like gas. She has had some relief with belching, but that is not consistent. She does report to eat somewhat about high fat diet, was fried foods, but not specifically spicy foods. She has been taking Zantac as needed and he does seem to improve his symptoms somewhat. An EKG was performed at Dr. Roland Earl office which was abnormal and I repeated that today again demonstrated nonspecific T wave changes but otherwise sinus rhythm. There is a family history of heart disease in her father who died of heart attack and kidney failure and her mother had kidney failure presumably due to hypertension and diabetes.  At her last office visit I recommended a nuclear stress test which she underwent on 09/24/2013. This was negative for ischemia with an EF of 87%. I reviewed results with her today and I feel that her symptoms are more likely related to reflux. She continues to have some belching.  03/12/2017  [name] returns today for follow-up. This is considered a new patient visit as her last visit was in November 2014. She was referred back by her endocrinologist Dr. Chalmers Cater. She reports having had a solitary episode of palpitations would last for a few seconds or less than a minute. This  occurred about a month ago but she's had no further episodes. She denies any worsening chest pain or worsening shortness of breath. As previously mentioned she had a stress test which was negative for ischemia in November 2014. She does have a number of coronary risk factors including insulin-dependent diabetes with end organ dysfunction, dyslipidemia and hypertension.  03/12/2018  [name] was seen today in follow-up.  She is accompanied by her granddaughter.  Overall she is without complaints.  She occasionally gets some pain that goes down her right leg and starts up at her right hip.  Blood pressure was elevated today 168/70 to have a recheck came at 134/60.  EKG shows sinus rhythm with voltage criteria for LVH at 66.  Labs last in August 2018 showed total cholesterol 160, HDL 48, LDL 84 and triglycerides 140.  01/30/2020  [name] is seen today in follow-up.  Overall she is without complaints.  Most recently she had lipids that showed total cholesterol 184, triglycerides were high at 243, HDL 41 and LDL 80.  Her A1c is also not well controlled at 8.5.  She is currently on atorvastatin 20 mg daily.  EKG shows sinus rhythm at 67 with nonspecific T wave changes.  PMHx:  Past Medical History:  Diagnosis Date  . Diabetic neuropathy ([place])   . GERD (gastroesophageal reflux disease)   . HYPERLIPIDEMIA 08/13/2007  . HYPERTENSION 08/13/2007  . INSOMNIA 08/13/2007  . Palpitations   . SVD (spontaneous vaginal delivery)    x 2  . Type 2 DM with ketoacidosis ([place]) 08/13/2007  Past Surgical History:  Procedure Laterality Date  . ANTERIOR AND POSTERIOR REPAIR N/A 06/03/2014   Procedure: ANTERIOR (CYSTOCELE) AND POSTERIOR REPAIR (RECTOCELE);  Surgeon: Melina Schools, MD;  Location: [facility];  Service: Gynecology;  Laterality: N/A;  . COLONOSCOPY    . DILATION AND CURETTAGE OF UTERUS    . EYE SURGERY     bilateral cataracts  . LAPAROSCOPIC APPENDECTOMY  2004   Dr Lindwood Qua  . right hand  surgery    . Stress Cardiolite  02/19/2007  . UMBILICAL HERNIA REPAIR  2004   Primary repair at time of appendectomy.  Dr Deon Pilling  . VAGINAL HYSTERECTOMY N/A 06/03/2014   Procedure: HYSTERECTOMY VAGINAL;  Surgeon: Melina Schools, MD;  Location: [facility];  Service: Gynecology;  Laterality: N/A;  2hrs OR time    FAMHx:  Family History  Problem Relation Age of Onset  . Kidney disease Brother   . Diabetes Brother   . Colon cancer Brother   . Diabetes Mother   . Diabetes Father   . Heart Problems Father   . Kidney disease Sister   . Colon cancer Sister   . Diabetes Sister   . Multiple sclerosis Child   . Hypertension Child        x2  . Diabetes Child        x2    SOCHx:   reports that she has never smoked. She has never used smokeless tobacco. She reports that she does not drink alcohol or use drugs.  ALLERGIES:  No Known Allergies  ROS: Pertinent items noted in HPI and remainder of comprehensive ROS otherwise negative.  HOME MEDS: Current Outpatient Medications  Medication Sig Dispense Refill  . aspirin 325 MG EC tablet Take 325 mg by mouth daily.    Marland Kitchen atorvastatin (LIPITOR) 20 MG tablet Take 1 tablet (20 mg total) by mouth daily. 30 tablet 5  . B-D ULTRAFINE III SHORT PEN 31G X 8 MM MISC use 3 to 4 TIMES A DAY as directed by prescriber 100 each 6  . bisoprolol-hydrochlorothiazide (ZIAC) 2.5-6.25 MG per tablet Take 1 tablet by mouth daily. 30 tablet 0  . Calcium Carb-Cholecalciferol (CALCIUM 600 + D) 600-200 MG-UNIT TABS Take 2 tablets by mouth daily.    . Cholecalciferol (VITAMIN D-3) 1000 UNITS CAPS Take 1 capsule by mouth daily.    . clorazepate (TRANXENE) 7.5 MG tablet Take 7.5 mg by mouth 2 (two) times daily as needed for anxiety.    . dorzolamide-timolol (COSOPT) 22.3-6.8 MG/ML ophthalmic solution Place 1 drop into both eyes 2 (two) times daily.   3  . FARXIGA 10 MG TABS tablet Take 10 mg by mouth daily.    Marland Kitchen gabapentin (NEURONTIN) 100 MG capsule Take 100-200 mg by  mouth 2 (two) times daily as needed (pain).     Marland Kitchen HUMALOG KWIKPEN 100 UNIT/ML KiwkPen Inject 12-18 Units into the skin 3 (three) times daily.  0  . insulin degludec (TRESIBA FLEXTOUCH) 100 UNIT/ML SOPN FlexTouch Pen Inject 30-45 Units into the skin daily at 10 pm.     . latanoprost (XALATAN) 0.005 % ophthalmic solution Place 1 drop into both eyes at bedtime.    . Multiple Vitamin (MULTIVITAMIN) capsule Take 1 capsule by mouth daily.    . ONE TOUCH ULTRA TEST test strip TEST twice a day - DIAGNOSIS CODE 250.01 100 each 11  . OVER THE COUNTER MEDICATION Take 1 tablet by mouth daily. Focus Factor     . timolol (TIMOPTIC) 0.5 %  ophthalmic solution timolol maleate 0.5 % eye drops    . vitamin C (ASCORBIC ACID) 500 MG tablet Take 500 mg by mouth 2 (two) times daily.      No current facility-administered medications for this visit.    LABS/IMAGING: No results found for this or any previous visit (from the past 48 hour(s)). No results found.  VITALS: BP 132/82   Pulse 67   Temp 97.6 F (36.4 C)   Ht 5\' 4"  (1.626 m)   Wt 166 lb (75.3 kg)   SpO2 97%   BMI 28.49 kg/m   EXAM: General appearance: alert and no distress Neck: no carotid bruit and no JVD Lungs: clear to auscultation bilaterally Heart: regular rate and rhythm Abdomen: soft, non-tender; bowel sounds normal; no masses,  no organomegaly Extremities: extremities normal, atraumatic, no cyanosis or edema Pulses: 2+ and symmetric Skin: Skin color, texture, turgor normal. No rashes or lesions Neurologic: Grossly normal Psych: Pleasant  EKG: Normal sinus rhythm 67, nonspecific T wave changes-personally reviewed  ASSESSMENT: 1. Chest pressure, with and without exertion - negative nuclear stress test 11/14 (resolved) 2. Insulin-dependent diabetes with neuropathy 3. Hypertension-controlled 4. Dyslipidemia 5. Family history of coronary disease 6. Probable GERD  PLAN: 1.   Ms. [name] could use better risk factor management.   Her cholesterol is elevated, specifically triglycerides and A1c was high at 8.5, likely driving those triglycerides.  Discussed dietary interventions and increase in exercise.  We will plan a recheck of her lipids in about 3 months and adjust medications accordingly if she is not able to reach her targets with improvement in diet and her glycemic index.  Beverly Casino, MD, [facility], [place] Director of the Advanced Lipid Disorders &  Cardiovascular Risk Reduction Clinic Diplomate of the American Board of Clinical Lipidology Attending Cardiologist  Direct Dial: [phone]  Fax: [phone]  Website:  [email or web address] 01/30/2020, 2:18 PM

## 2020-02-02 ENCOUNTER — Encounter: Payer: Self-pay | Admitting: Internal Medicine

## 2020-02-07 ENCOUNTER — Ambulatory Visit
Admission: RE | Admit: 2020-02-07 | Discharge: 2020-02-07 | Disposition: A | Discharge status: Home / Self Care | Payer: Medicare Other | Source: Ambulatory Visit | Attending: Orthopaedic Surgery | Admitting: Orthopaedic Surgery

## 2020-02-07 DIAGNOSIS — M79604 Pain in right leg: Secondary | ICD-10-CM

## 2020-02-07 DIAGNOSIS — M48061 Spinal stenosis, lumbar region without neurogenic claudication: Secondary | ICD-10-CM | POA: Diagnosis not present

## 2020-02-10 ENCOUNTER — Ambulatory Visit (INDEPENDENT_AMBULATORY_CARE_PROVIDER_SITE_OTHER): Payer: Medicare Other | Admitting: Dermatology

## 2020-02-10 ENCOUNTER — Other Ambulatory Visit: Payer: Self-pay

## 2020-02-10 ENCOUNTER — Encounter: Payer: Self-pay | Admitting: Dermatology

## 2020-02-10 DIAGNOSIS — L821 Other seborrheic keratosis: Secondary | ICD-10-CM | POA: Diagnosis not present

## 2020-02-10 DIAGNOSIS — L815 Leukoderma, not elsewhere classified: Secondary | ICD-10-CM

## 2020-02-10 NOTE — Patient Instructions (Addendum)
First visit in many years for [employer].  She has multiple brown textured spots on her cheekbones and across her back; these were shown to her daughter.  These are benign seborrheic keratosis and require no intervention.  She has 2 types of 1 to 47mm light to white spots scattered on her extremities and torso.  Some of these are absolutely flat and represent guttate hypomelanosis, an idiopathic loss of pigment that is not related to vitiligo or her diabetes.  Others have a little subtle texture and represent punctate keratoses, a second cousin to benign seborrheic keratoses.  Neither of these require medical intervention.

## 2020-02-11 DIAGNOSIS — Z794 Long term (current) use of insulin: Secondary | ICD-10-CM | POA: Diagnosis not present

## 2020-02-11 DIAGNOSIS — E118 Type 2 diabetes mellitus with unspecified complications: Secondary | ICD-10-CM | POA: Diagnosis not present

## 2020-02-13 NOTE — Progress Notes (Addendum)
   New Patient   Subjective  [name] is a 84 y.o. female who presents for the following: Skin Problem (spots on arms, abdomen and all over legs- losing pigment).  Discolored spots Location: Mostly arms and face Duration: years Quality: All stable Associated Signs/Symptoms: Modifying Factors:  Severity:  Timing: Context:   The following portions of the chart were reviewed this encounter and updated as appropriate:     Objective  Well appearing patient in no apparent distress; mood and affect are within normal limits.  A focused examination was performed including hands, arms, face, neck, back. Relevant physical exam findings are noted in the Assessment and Plan.   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  She has multiple brown textured spots on her cheekbones and across her back; these were shown to her daughter.  These are benign seborrheic keratosis and require no intervention.  She has 2 types of 1 to 37mm light to white spots scattered on her extremities and torso.  Some of these are absolutely flat and represent guttate hypomelanosis, an idiopathic loss of pigment that is not related to vitiligo or her diabetes.  Others have a little subtle texture and represent punctate keratoses, a second cousin to benign seborrheic keratoses.  Neither of these require medical intervention. Follow-up can be on a as needed basis.

## 2020-03-12 DIAGNOSIS — E118 Type 2 diabetes mellitus with unspecified complications: Secondary | ICD-10-CM | POA: Diagnosis not present

## 2020-03-12 DIAGNOSIS — Z794 Long term (current) use of insulin: Secondary | ICD-10-CM | POA: Diagnosis not present

## 2020-04-11 DIAGNOSIS — E118 Type 2 diabetes mellitus with unspecified complications: Secondary | ICD-10-CM | POA: Diagnosis not present

## 2020-04-11 DIAGNOSIS — Z794 Long term (current) use of insulin: Secondary | ICD-10-CM | POA: Diagnosis not present

## 2020-04-26 DIAGNOSIS — H401134 Primary open-angle glaucoma, bilateral, indeterminate stage: Secondary | ICD-10-CM | POA: Diagnosis not present

## 2020-04-26 DIAGNOSIS — H02051 Trichiasis without entropian right upper eyelid: Secondary | ICD-10-CM | POA: Diagnosis not present

## 2020-04-28 DIAGNOSIS — E1165 Type 2 diabetes mellitus with hyperglycemia: Secondary | ICD-10-CM | POA: Diagnosis not present

## 2020-05-05 DIAGNOSIS — E782 Mixed hyperlipidemia: Secondary | ICD-10-CM | POA: Diagnosis not present

## 2020-05-06 LAB — LIPID PANEL
Chol/HDL Ratio: 3.8 ratio (ref 0.0–4.4)
Cholesterol, Total: 178 mg/dL (ref 100–199)
HDL: 47 mg/dL (ref >39)
LDL Chol Calc (NIH): 96 mg/dL (ref 0–99)
Triglycerides: 204 mg/dL — ABNORMAL HIGH (ref 0–149)
VLDL Cholesterol Cal: 35 mg/dL (ref 5–40)

## 2020-05-07 ENCOUNTER — Other Ambulatory Visit: Payer: Self-pay | Admitting: *Deleted

## 2020-05-07 MED ORDER — ATORVASTATIN CALCIUM 40 MG PO TABS: 40.0000 mg | ORAL_TABLET | Freq: Every day | ORAL | 3 refills | Status: DC

## 2020-05-12 DIAGNOSIS — E118 Type 2 diabetes mellitus with unspecified complications: Secondary | ICD-10-CM | POA: Diagnosis not present

## 2020-05-12 DIAGNOSIS — Z794 Long term (current) use of insulin: Secondary | ICD-10-CM | POA: Diagnosis not present

## 2020-06-11 DIAGNOSIS — Z794 Long term (current) use of insulin: Secondary | ICD-10-CM | POA: Diagnosis not present

## 2020-06-11 DIAGNOSIS — E118 Type 2 diabetes mellitus with unspecified complications: Secondary | ICD-10-CM | POA: Diagnosis not present

## 2020-07-05 DIAGNOSIS — H401133 Primary open-angle glaucoma, bilateral, severe stage: Secondary | ICD-10-CM | POA: Diagnosis not present

## 2020-07-05 DIAGNOSIS — H02051 Trichiasis without entropian right upper eyelid: Secondary | ICD-10-CM | POA: Diagnosis not present

## 2020-07-12 DIAGNOSIS — Z794 Long term (current) use of insulin: Secondary | ICD-10-CM | POA: Diagnosis not present

## 2020-07-12 DIAGNOSIS — E118 Type 2 diabetes mellitus with unspecified complications: Secondary | ICD-10-CM | POA: Diagnosis not present

## 2020-08-18 DIAGNOSIS — Z794 Long term (current) use of insulin: Secondary | ICD-10-CM | POA: Diagnosis not present

## 2020-08-18 DIAGNOSIS — E118 Type 2 diabetes mellitus with unspecified complications: Secondary | ICD-10-CM | POA: Diagnosis not present

## 2020-08-27 DIAGNOSIS — E7800 Pure hypercholesterolemia, unspecified: Secondary | ICD-10-CM | POA: Diagnosis not present

## 2020-08-27 DIAGNOSIS — E1165 Type 2 diabetes mellitus with hyperglycemia: Secondary | ICD-10-CM | POA: Diagnosis not present

## 2020-08-27 DIAGNOSIS — I10 Essential (primary) hypertension: Secondary | ICD-10-CM | POA: Diagnosis not present

## 2020-08-27 DIAGNOSIS — E1140 Type 2 diabetes mellitus with diabetic neuropathy, unspecified: Secondary | ICD-10-CM | POA: Diagnosis not present

## 2020-09-18 DIAGNOSIS — Z794 Long term (current) use of insulin: Secondary | ICD-10-CM | POA: Diagnosis not present

## 2020-09-18 DIAGNOSIS — E118 Type 2 diabetes mellitus with unspecified complications: Secondary | ICD-10-CM | POA: Diagnosis not present

## 2020-09-25 IMAGING — MR MR LUMBAR SPINE W/O CM
4 of 5 series · 25 of 48 positions shown · non-contrast
Comparison: Lumbar radiographs 10/30/2019

CLINICAL DATA: Low back pain with bilateral leg pain. Lumbar spinal
stenosis.

EXAM:
MRI LUMBAR SPINE WITHOUT CONTRAST
TECHNIQUE: Multiplanar, multisequence MR imaging of the lumbar spine was
performed. No intravenous contrast was administered.

[Series 3: T2 post-contrast · sagittal · 4.0mm · 0.55mm/px · 7 of 15 slices shown]
[im 1/15]
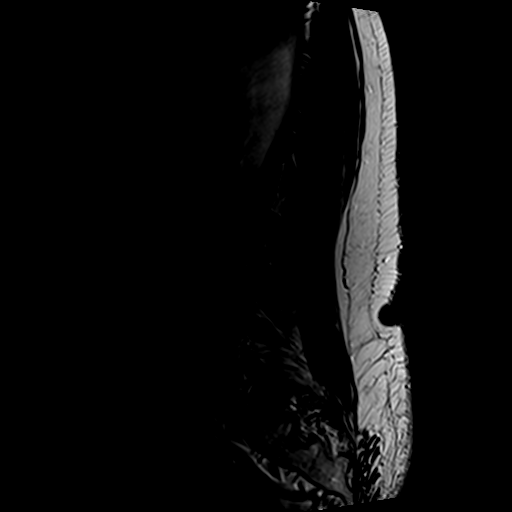
[im 3/15]
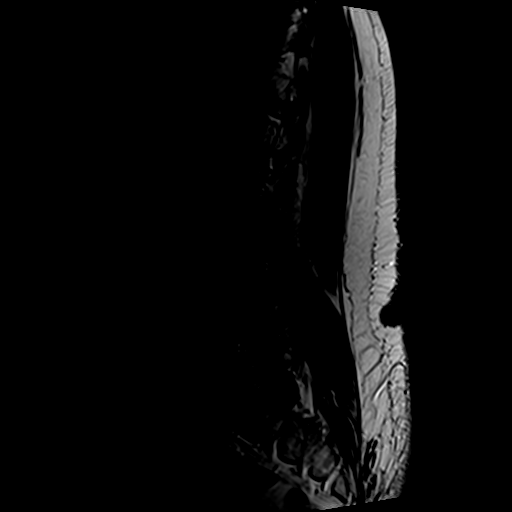
[im 5/15]
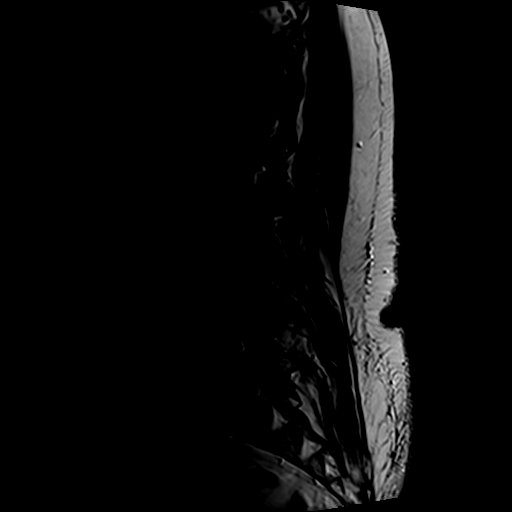
[im 8/15]
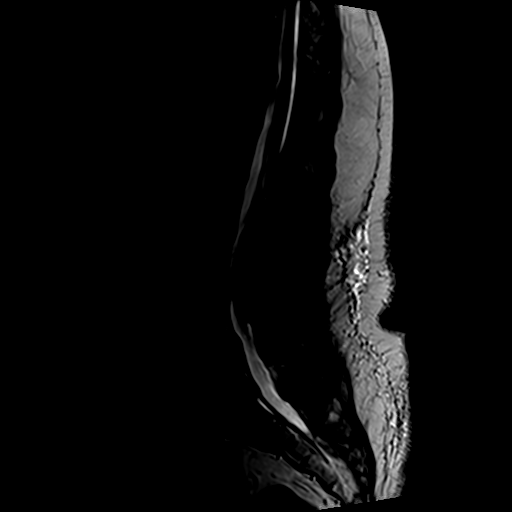
[im 10/15]
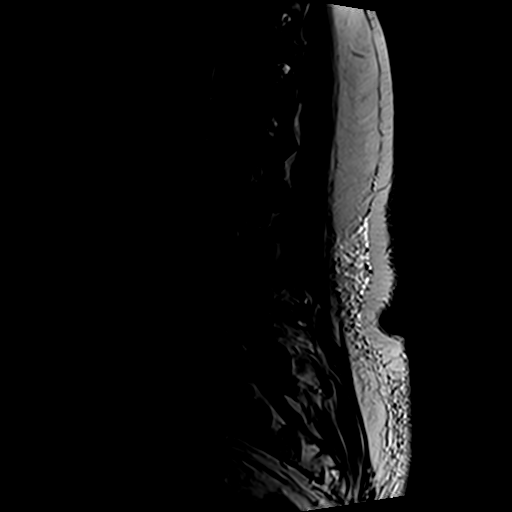
[im 12/15]
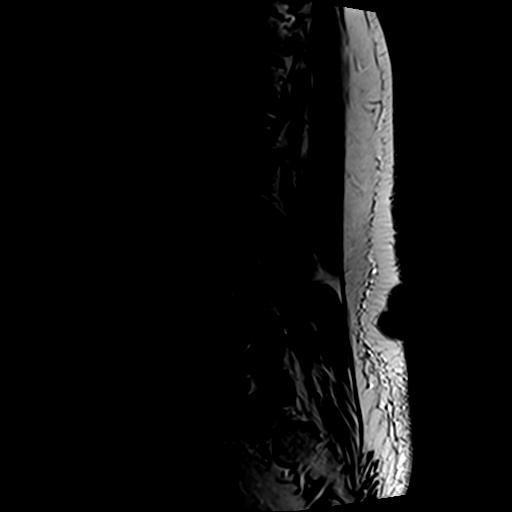
[im 15/15]
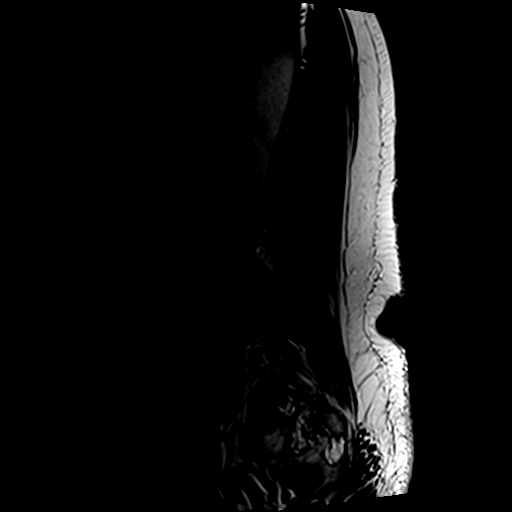

[Series 5: T1 · sagittal · 4.0mm · 0.55mm/px · 6 of 15 slices shown (1 of 2)]
[im 1/15]
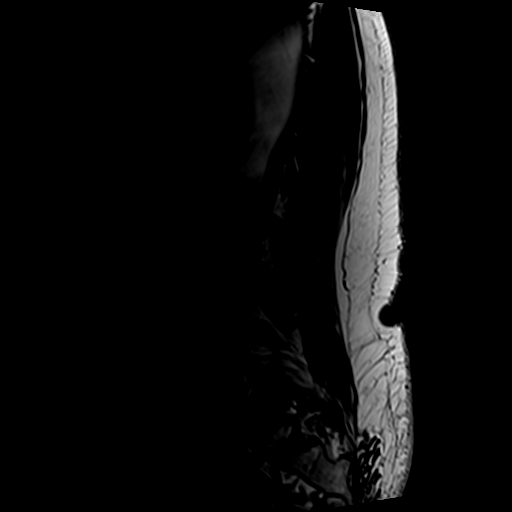
[im 3/15]
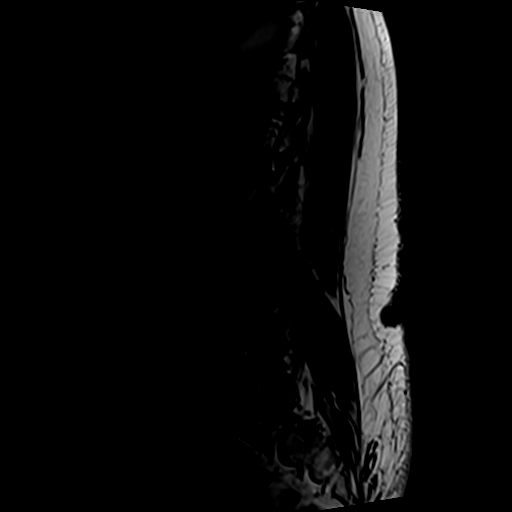
[im 6/15]
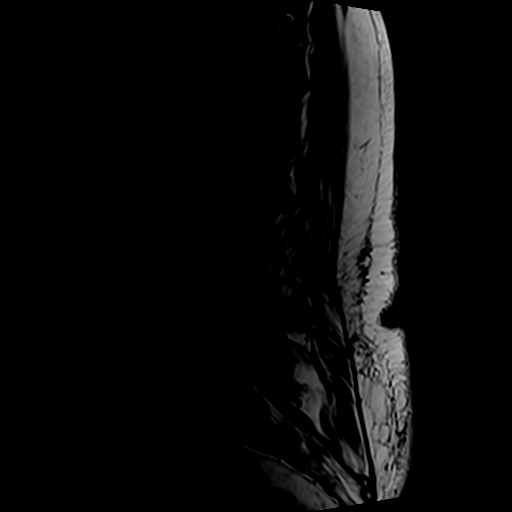
[im 9/15]
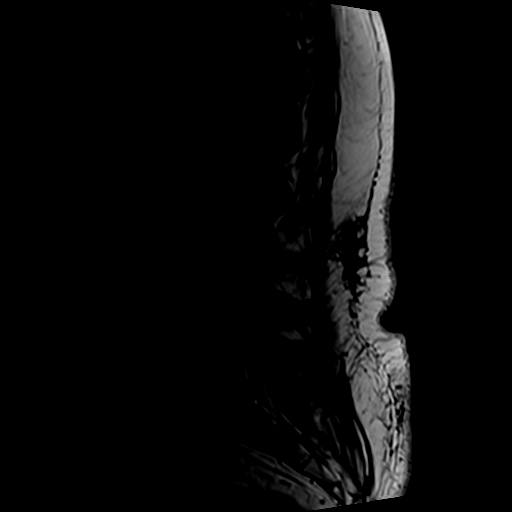
[im 12/15]
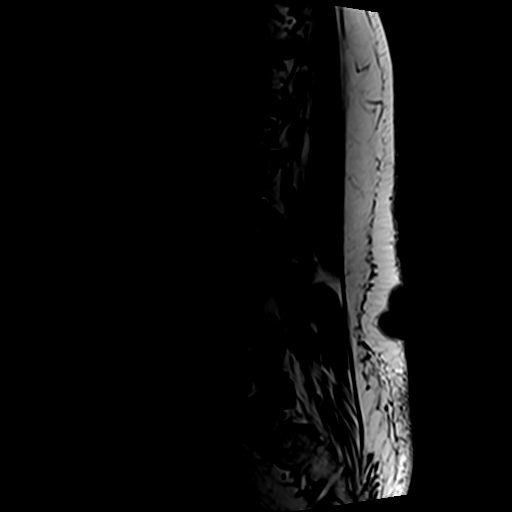
[im 15/15]
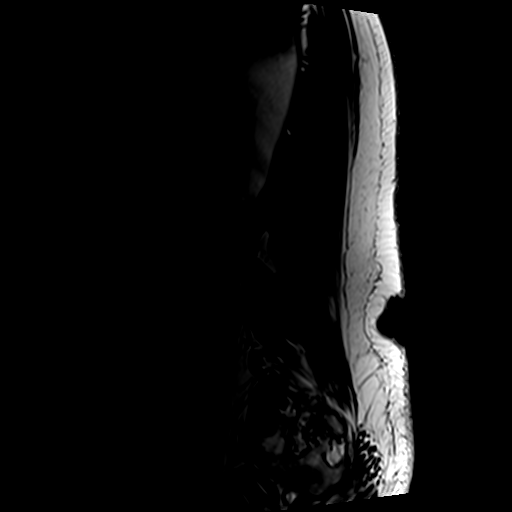

[Series 6: T2 · axial · 4.0mm · 0.70mm/px · z∈[-133,+70]mm · 8 of 33 slices shown]
[im 1/33]
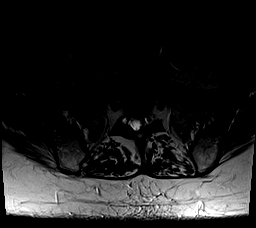
[im 5/33]
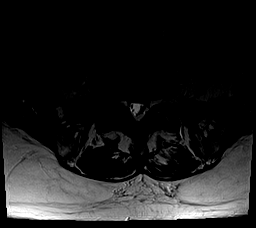
[im 10/33]
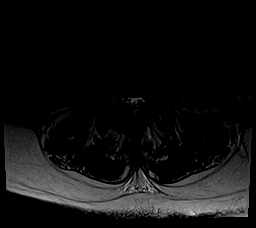
[im 15/33]
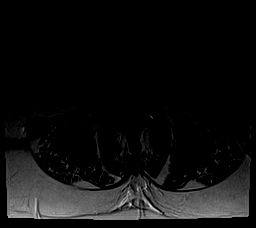
[im 18/33]
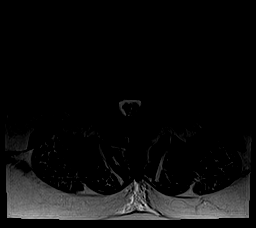
[im 23/33]
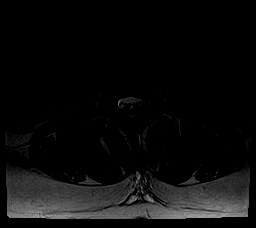
[im 28/33]
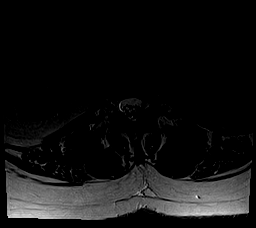
[im 33/33]
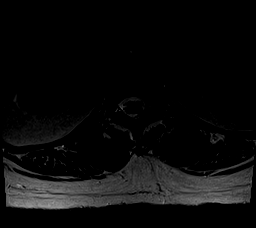

[Series 7: T1 · axial · 4.0mm · 0.35mm/px · z∈[-133,+34]mm · 4 of 33 slices shown (2 of 2)]
[im 1/33]
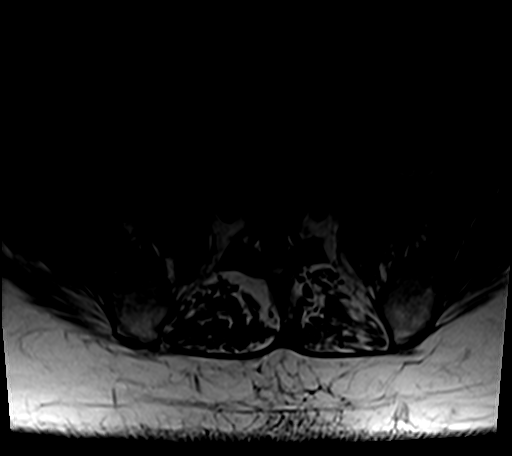
[im 5/33]
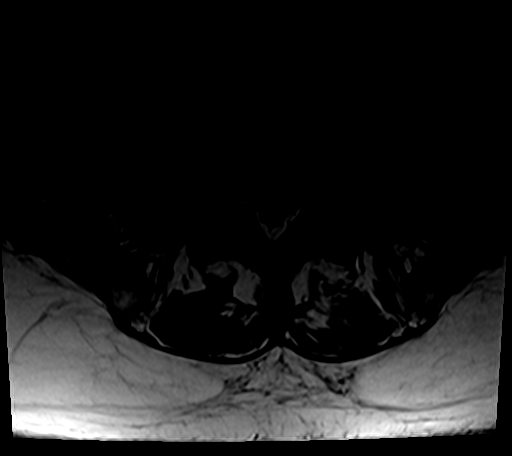
[im 18/33]
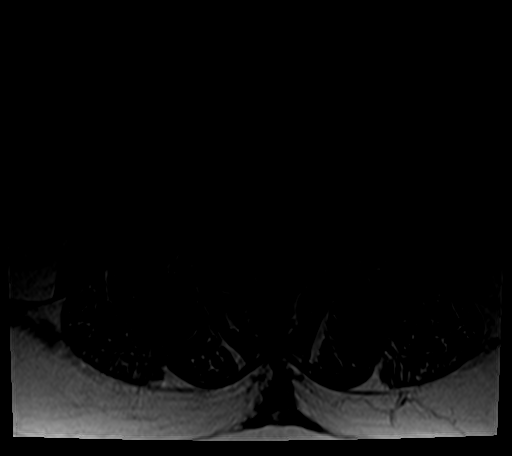
[im 28/33]
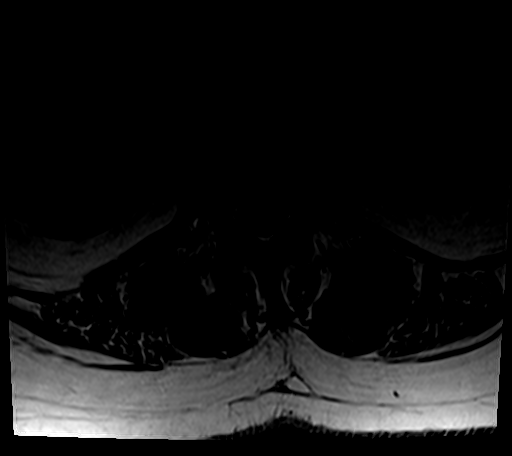

[25 of 48 positions shown; findings below may reference images not displayed]

FINDINGS: Segmentation:  Normal

Alignment: 5 mm anterolisthesis L4-5. Slight retrolisthesis L1-2 and
L2-3

Vertebrae: Negative for fracture or mass. Hemangioma L1 vertebral
body.

Conus medullaris and cauda equina: Conus extends to the L1-2 level.
Conus and cauda equina appear normal.

Paraspinal and other soft tissues: Negative for paraspinous mass or
adenopathy.

Disc levels:

L1-2: Negative

L2-3: Mild disc and mild facet degeneration.  Negative for stenosis

L3-4: Disc degeneration with diffuse disc bulging and endplate
spurring. Advanced facet and ligamentum flavum hypertrophy causing
severe spinal stenosis. Moderate to severe subarticular stenosis
bilaterally

L4-5: 5 mm anterolisthesis with diffuse disc bulging and left
paracentral small disc protrusion with annular fissure. Advanced
facet degeneration bilaterally causing moderate spinal stenosis.
Moderate subarticular stenosis bilaterally left greater than right

L5-S1: Disc degeneration with diffuse disc bulging. Bilateral facet
hypertrophy. Moderate subarticular and foraminal stenosis on the
right.
IMPRESSION: Severe spinal stenosis L3-4 with moderate to severe subarticular
stenosis bilaterally

5 mm anterolisthesis L4-5 with severe facet degeneration and
moderate spinal stenosis.

Moderate subarticular and foraminal stenosis on the right L5-S1.

## 2020-10-11 DIAGNOSIS — E7800 Pure hypercholesterolemia, unspecified: Secondary | ICD-10-CM | POA: Diagnosis not present

## 2020-10-11 DIAGNOSIS — R946 Abnormal results of thyroid function studies: Secondary | ICD-10-CM | POA: Diagnosis not present

## 2020-10-11 DIAGNOSIS — I10 Essential (primary) hypertension: Secondary | ICD-10-CM | POA: Diagnosis not present

## 2020-10-12 DIAGNOSIS — H401133 Primary open-angle glaucoma, bilateral, severe stage: Secondary | ICD-10-CM | POA: Diagnosis not present

## 2020-10-18 DIAGNOSIS — E7800 Pure hypercholesterolemia, unspecified: Secondary | ICD-10-CM | POA: Diagnosis not present

## 2020-10-18 DIAGNOSIS — E1129 Type 2 diabetes mellitus with other diabetic kidney complication: Secondary | ICD-10-CM | POA: Diagnosis not present

## 2020-10-18 DIAGNOSIS — Z0000 Encounter for general adult medical examination without abnormal findings: Secondary | ICD-10-CM | POA: Diagnosis not present

## 2020-10-18 DIAGNOSIS — E118 Type 2 diabetes mellitus with unspecified complications: Secondary | ICD-10-CM | POA: Diagnosis not present

## 2020-10-18 DIAGNOSIS — I129 Hypertensive chronic kidney disease with stage 1 through stage 4 chronic kidney disease, or unspecified chronic kidney disease: Secondary | ICD-10-CM | POA: Diagnosis not present

## 2020-10-18 DIAGNOSIS — M546 Pain in thoracic spine: Secondary | ICD-10-CM | POA: Diagnosis not present

## 2020-10-18 DIAGNOSIS — E1140 Type 2 diabetes mellitus with diabetic neuropathy, unspecified: Secondary | ICD-10-CM | POA: Diagnosis not present

## 2020-10-18 DIAGNOSIS — Z794 Long term (current) use of insulin: Secondary | ICD-10-CM | POA: Diagnosis not present

## 2020-10-18 DIAGNOSIS — K219 Gastro-esophageal reflux disease without esophagitis: Secondary | ICD-10-CM | POA: Diagnosis not present

## 2020-11-12 DIAGNOSIS — Z794 Long term (current) use of insulin: Secondary | ICD-10-CM | POA: Diagnosis not present

## 2020-11-12 DIAGNOSIS — E118 Type 2 diabetes mellitus with unspecified complications: Secondary | ICD-10-CM | POA: Diagnosis not present

## 2020-12-01 ENCOUNTER — Ambulatory Visit: Payer: Medicare Other | Admitting: Orthopaedic Surgery

## 2020-12-13 DIAGNOSIS — Z794 Long term (current) use of insulin: Secondary | ICD-10-CM | POA: Diagnosis not present

## 2020-12-13 DIAGNOSIS — E118 Type 2 diabetes mellitus with unspecified complications: Secondary | ICD-10-CM | POA: Diagnosis not present

## 2020-12-29 ENCOUNTER — Ambulatory Visit: Payer: Medicare Other | Admitting: Orthopaedic Surgery

## 2021-01-06 ENCOUNTER — Other Ambulatory Visit: Payer: Self-pay

## 2021-01-06 ENCOUNTER — Ambulatory Visit (INDEPENDENT_AMBULATORY_CARE_PROVIDER_SITE_OTHER): Payer: Medicare Other | Admitting: Orthopaedic Surgery

## 2021-01-06 ENCOUNTER — Encounter: Payer: Self-pay | Admitting: Orthopaedic Surgery

## 2021-01-06 VITALS — Ht 64.0 in | Wt 166.0 lb

## 2021-01-06 DIAGNOSIS — M545 Low back pain, unspecified: Secondary | ICD-10-CM

## 2021-01-06 NOTE — Progress Notes (Signed)
Office Visit Note   Patient: [name]           Date of Birth: Jun 20, 1936           MRN: [id number] Visit Date: 01/06/2021              Requested by: Janie Morning, DO [address] [place],  [place] [id number] PCP: Janie Morning, DO   Assessment & Plan: Visit Diagnoses:  1. Right low back pain, unspecified chronicity, unspecified whether sciatica present     Plan: Mrs. Whittenberg is accompanied by her daughter and here for follow-up evaluation of her low back pain.  She had an MRI scan in March of last year demonstrating significant spinal stenosis at L3-4 and L4-5 with a 5 mm anterior listhesis of L4 on 5 with severe facet degeneration.  She has taken some muscle relaxants but it makes her "tired" and occasional Tylenol it seems to help.  She has more trouble when she stands or when she walks.  She is having more back than leg pain.  She is not had an epidural steroid injection but I think that would be worthwhile.  I will consult Dr. Ernestina Patches and see what he thinks.  I like to see her back in a month.  We had discussed physical therapy at some point  Follow-Up Instructions: Return in about 1 month (around 02/03/2021).   Orders:  Orders Placed This Encounter  Procedures  . Ambulatory referral to Physical Medicine Rehab   No orders of the defined types were placed in this encounter.     Procedures: No procedures performed   Clinical Data: No additional findings.   Subjective: Chief Complaint  Patient presents with  . Lower Back - Pain  Patient presents today for her chronic back pain. She states that she has been doing okay overall. She was stepping down from a chair at the end of last year and a doorknob hit her lower back. She said that she has more pain with sitting. Her legs are fine, no numbness or tingling.  She is not having any significant bowel bladder function changes.  Denies any hip pain.  The pain is predominately in her back and worse the  longer she stands of the further she walks  HPI  Review of Systems   Objective: Vital Signs: Ht 5\' 4"  (1.626 m)   Wt 166 lb (75.3 kg)   BMI 28.49 kg/m   Physical Exam Constitutional:      Appearance: She is well-developed and well-nourished.  HENT:     Mouth/Throat:     Mouth: Oropharynx is clear and moist.  Eyes:     Extraocular Movements: EOM normal.     Pupils: Pupils are equal, round, and reactive to light.  Pulmonary:     Effort: Pulmonary effort is normal.  Skin:    General: Skin is warm and dry.  Neurological:     Mental Status: She is alert and oriented to person, place, and time.  Psychiatric:        Mood and Affect: Mood and affect normal.        Behavior: Behavior normal.     Ortho Exam awake alert and oriented x3.  Comfortable sitting.  Straight leg raise is negative.  No percussible tenderness of the lumbar spine, sacrum or either SI joint.  Painless range of motion of both hips.  Motor exam appears to be intact.  No use of ambulatory aid  Specialty Comments:  No specialty comments available.  Imaging: No results found.   PMFS History: Patient Active Problem List   Diagnosis Date Noted  . Low back pain 10/30/2019  . Uterine prolapse without vaginal wall prolapse 09/20/2018  . History of cystocele 09/20/2018  . Anemia 09/20/2018  . Fever, unspecified 09/20/2018  . Abdominal pain, epigastric 09/20/2018  . Nausea with vomiting 09/20/2018  . Recurrent ventral incisional hernia 09/20/2018  . Sepsis due to Gram negative bacteria ([place]) 09/20/2018  . CKD (chronic kidney disease) stage 3, GFR 30-59 ml/min (HCC) 09/20/2018  . Anxiety 09/20/2018  . Diabetic neuropathy ([place]) 09/20/2018  . E coli bacteremia 09/20/2018  . Sepsis ([place]) 09/19/2018  . Palpitations 03/12/2017  . Prolapse of anterior vaginal wall 06/03/2014  . Chest pressure 09/16/2013  . GERD (gastroesophageal reflux disease) 09/16/2013  . Pain in limb 03/16/2008  . Type 1 diabetes,  controlled, with neuropathy ([place]) 08/13/2007  . Hyperlipidemia 08/13/2007  . HTN (hypertension) 08/13/2007  . INSOMNIA 08/13/2007  . WEIGHT GAIN 08/13/2007   Past Medical History:  Diagnosis Date  . Diabetic neuropathy ([place])   . GERD (gastroesophageal reflux disease)   . HYPERLIPIDEMIA 08/13/2007  . HYPERTENSION 08/13/2007  . INSOMNIA 08/13/2007  . Palpitations   . SVD (spontaneous vaginal delivery)    x 2  . Type 2 DM with ketoacidosis ([place]) 08/13/2007    Family History  Problem Relation Age of Onset  . Kidney disease Brother   . Diabetes Brother   . Colon cancer Brother   . Diabetes Mother   . Diabetes Father   . Heart Problems Father   . Kidney disease Sister   . Colon cancer Sister   . Diabetes Sister   . Multiple sclerosis Child   . Hypertension Child        x2  . Diabetes Child        x2    Past Surgical History:  Procedure Laterality Date  . ANTERIOR AND POSTERIOR REPAIR N/A 06/03/2014   Procedure: ANTERIOR (CYSTOCELE) AND POSTERIOR REPAIR (RECTOCELE);  Surgeon: Melina Schools, MD;  Location: [facility];  Service: Gynecology;  Laterality: N/A;  . COLONOSCOPY    . DILATION AND CURETTAGE OF UTERUS    . EYE SURGERY     bilateral cataracts  . LAPAROSCOPIC APPENDECTOMY  2004   Dr Lindwood Qua  . right hand surgery    . Stress Cardiolite  02/19/2007  . UMBILICAL HERNIA REPAIR  2004   Primary repair at time of appendectomy.  Dr Deon Pilling  . VAGINAL HYSTERECTOMY N/A 06/03/2014   Procedure: HYSTERECTOMY VAGINAL;  Surgeon: Melina Schools, MD;  Location: [facility];  Service: Gynecology;  Laterality: N/A;  2hrs OR time   Social History   Occupational History  . Not on file  Tobacco Use  . Smoking status: Never Smoker  . Smokeless tobacco: Never Used  Substance and Sexual Activity  . Alcohol use: No  . Drug use: No  . Sexual activity: Yes    Birth control/protection: Post-menopausal

## 2021-01-10 DIAGNOSIS — Z794 Long term (current) use of insulin: Secondary | ICD-10-CM | POA: Diagnosis not present

## 2021-01-10 DIAGNOSIS — E118 Type 2 diabetes mellitus with unspecified complications: Secondary | ICD-10-CM | POA: Diagnosis not present

## 2021-02-02 ENCOUNTER — Telehealth: Payer: Self-pay | Admitting: Physical Medicine and Rehabilitation

## 2021-02-02 ENCOUNTER — Ambulatory Visit: Payer: Medicare Other | Admitting: Physical Medicine and Rehabilitation

## 2021-02-02 NOTE — Telephone Encounter (Signed)
Appointment cancelled. Left message #1 to reschedule.

## 2021-02-02 NOTE — Telephone Encounter (Signed)
Patient called requesting to reschedule appt. Please call patient about this matter at [phone].

## 2021-02-04 NOTE — Telephone Encounter (Signed)
Left message #2

## 2021-02-08 ENCOUNTER — Encounter: Payer: Self-pay | Admitting: Internal Medicine

## 2021-02-08 ENCOUNTER — Ambulatory Visit (INDEPENDENT_AMBULATORY_CARE_PROVIDER_SITE_OTHER): Payer: Medicare Other | Admitting: Internal Medicine

## 2021-02-08 ENCOUNTER — Other Ambulatory Visit: Payer: Self-pay

## 2021-02-08 VITALS — BP 130/71 | HR 62 | Ht 63.0 in | Wt 173.6 lb

## 2021-02-08 DIAGNOSIS — E782 Mixed hyperlipidemia: Secondary | ICD-10-CM

## 2021-02-08 DIAGNOSIS — I10 Essential (primary) hypertension: Secondary | ICD-10-CM

## 2021-02-08 DIAGNOSIS — E1040 Type 1 diabetes mellitus with diabetic neuropathy, unspecified: Secondary | ICD-10-CM | POA: Diagnosis not present

## 2021-02-08 NOTE — Patient Instructions (Signed)
Medication Instructions:  START aspirin 81mg  daily  *If you need a refill on your cardiac medications before your next appointment, please call your pharmacy*   Lab Work: Lipid Panel, A1c today  If you have labs (blood work) drawn today and your tests are completely normal, you will receive your results only by: Marland Kitchen MyChart Message (if you have MyChart) OR . A paper copy in the mail If you have any lab test that is abnormal or we need to change your treatment, we will call you to review the results.   Testing/Procedures: NONE   Follow-Up: At [facility], you and your health needs are our priority.  As part of our continuing mission to provide you with exceptional heart care, we have created designated Provider Care Teams.  These Care Teams include your primary Cardiologist (physician) and Advanced Practice Providers (APPs -  Physician Assistants and Nurse Practitioners) who all work together to provide you with the care you need, when you need it.  We recommend signing up for the patient portal called "MyChart".  Sign up information is provided on this After Visit Summary.  MyChart is used to connect with patients for Virtual Visits (Telemedicine).  Patients are able to view lab/test results, encounter notes, upcoming appointments, etc.  Non-urgent messages can be sent to your provider as well.   To learn more about what you can do with MyChart, go to [email or web address].    Your next appointment:   12 month(s)  The format for your next appointment:   In Person  Provider:   Lyman Bishop, MD   Other Instructions

## 2021-02-08 NOTE — Progress Notes (Signed)
OFFICE NOTE  Chief Complaint:  No complaints  Primary Care Physician: Janie Morning, DO  HPI:  [name] is a pleasant 85 year old female referred to me by Dr. Karlton Lemon. She is a past medical history significant for diabetes on insulin, peripheral neuropathy secondary to diabetes, dyslipidemia and hypertension. Over the past several weeks she's noted some chest pressure which she says starts in the upper abdominal region and is more substernal. She had one episode that occurred when she was walking on a treadmill but improved somewhat after she stopped and rested. She has had several other episodes not associated with exercise however sometimes the symptoms are associated with a pressure feeling that feels like gas. She has had some relief with belching, but that is not consistent. She does report to eat somewhat about high fat diet, was fried foods, but not specifically spicy foods. She has been taking Zantac as needed and he does seem to improve his symptoms somewhat. An EKG was performed at Dr. Roland Earl office which was abnormal and I repeated that today again demonstrated nonspecific T wave changes but otherwise sinus rhythm. There is a family history of heart disease in her father who died of heart attack and kidney failure and her mother had kidney failure presumably due to hypertension and diabetes.  At her last office visit I recommended a nuclear stress test which she underwent on 09/24/2013. This was negative for ischemia with an EF of 87%. I reviewed results with her today and I feel that her symptoms are more likely related to reflux. She continues to have some belching.  03/12/2017  [name] returns today for follow-up. This is considered a new patient visit as her last visit was in November 2014. She was referred back by her endocrinologist Dr. Chalmers Cater. She reports having had a solitary episode of palpitations would last for a few seconds or less than a minute. This  occurred about a month ago but she's had no further episodes. She denies any worsening chest pain or worsening shortness of breath. As previously mentioned she had a stress test which was negative for ischemia in November 2014. She does have a number of coronary risk factors including insulin-dependent diabetes with end organ dysfunction, dyslipidemia and hypertension.  03/12/2018  [name] was seen today in follow-up.  She is accompanied by her granddaughter.  Overall she is without complaints.  She occasionally gets some pain that goes down her right leg and starts up at her right hip.  Blood pressure was elevated today 168/70 to have a recheck came at 134/60.  EKG shows sinus rhythm with voltage criteria for LVH at 66.  Labs last in August 2018 showed total cholesterol 160, HDL 48, LDL 84 and triglycerides 140.  01/30/2020  [name] is seen today in follow-up.  Overall she is without complaints.  Most recently she had lipids that showed total cholesterol 184, triglycerides were high at 243, HDL 41 and LDL 80.  Her A1c is also not well controlled at 8.5.  She is currently on atorvastatin 20 mg daily.  EKG shows sinus rhythm at 67 with nonspecific T wave changes.  02/08/2021  [name] is seen today for follow-up.  She denies any shortness of breath or worsening chest pain.  I noted that her cholesterol recently was more elevated.  Total 195, HDL 42, LDL 118 and triglycerides 200.  For some reason she was not taking her atorvastatin.  PCP then switched her to rosuvastatin 5  mg 3 times weekly.  She says that she is currently taking this.  She also stopped taking daily aspirin because she occasionally takes full dose aspirin.  As a diabetic, I encouraged her to take it regularly.  PMHx:  Past Medical History:  Diagnosis Date  . Diabetic neuropathy ([place])   . GERD (gastroesophageal reflux disease)   . HYPERLIPIDEMIA 08/13/2007  . HYPERTENSION 08/13/2007  . INSOMNIA 08/13/2007  .  Palpitations   . SVD (spontaneous vaginal delivery)    x 2  . Type 2 DM with ketoacidosis ([place]) 08/13/2007    Past Surgical History:  Procedure Laterality Date  . ANTERIOR AND POSTERIOR REPAIR N/A 06/03/2014   Procedure: ANTERIOR (CYSTOCELE) AND POSTERIOR REPAIR (RECTOCELE);  Surgeon: Melina Schools, MD;  Location: [facility];  Service: Gynecology;  Laterality: N/A;  . COLONOSCOPY    . DILATION AND CURETTAGE OF UTERUS    . EYE SURGERY     bilateral cataracts  . LAPAROSCOPIC APPENDECTOMY  2004   Dr Lindwood Qua  . right hand surgery    . Stress Cardiolite  02/19/2007  . UMBILICAL HERNIA REPAIR  2004   Primary repair at time of appendectomy.  Dr Deon Pilling  . VAGINAL HYSTERECTOMY N/A 06/03/2014   Procedure: HYSTERECTOMY VAGINAL;  Surgeon: Melina Schools, MD;  Location: [facility];  Service: Gynecology;  Laterality: N/A;  2hrs OR time    FAMHx:  Family History  Problem Relation Age of Onset  . Kidney disease Brother   . Diabetes Brother   . Colon cancer Brother   . Diabetes Mother   . Diabetes Father   . Heart Problems Father   . Kidney disease Sister   . Colon cancer Sister   . Diabetes Sister   . Multiple sclerosis Child   . Hypertension Child        x2  . Diabetes Child        x2    SOCHx:   reports that she has never smoked. She has never used smokeless tobacco. She reports that she does not drink alcohol and does not use drugs.  ALLERGIES:  No Known Allergies  ROS: Pertinent items noted in HPI and remainder of comprehensive ROS otherwise negative.  HOME MEDS: Current Outpatient Medications  Medication Sig Dispense Refill  . aspirin 325 MG EC tablet Take 325 mg by mouth daily.    . B-D ULTRAFINE III SHORT PEN 31G X 8 MM MISC use 3 to 4 TIMES A DAY as directed by prescriber 100 each 6  . bisoprolol-hydrochlorothiazide (ZIAC) 2.5-6.25 MG per tablet Take 1 tablet by mouth daily. 30 tablet 0  . Calcium Carb-Cholecalciferol 600-200 MG-UNIT TABS Take 2 tablets by mouth daily.     . Cholecalciferol (VITAMIN D-3) 1000 UNITS CAPS Take 1 capsule by mouth daily.    . clorazepate (TRANXENE) 7.5 MG tablet Take 7.5 mg by mouth 2 (two) times daily as needed for anxiety.    . dorzolamide-timolol (COSOPT) 22.3-6.8 MG/ML ophthalmic solution Place 1 drop into both eyes 2 (two) times daily.   3  . FARXIGA 10 MG TABS tablet Take 10 mg by mouth daily.    Marland Kitchen gabapentin (NEURONTIN) 100 MG capsule Take 100-200 mg by mouth 2 (two) times daily as needed (pain).    Marland Kitchen HUMALOG KWIKPEN 100 UNIT/ML KiwkPen Inject 12-18 Units into the skin 3 (three) times daily.  0  . insulin degludec (TRESIBA) 100 UNIT/ML FlexTouch Pen Inject 30-45 Units into the skin daily at 10 pm.     .  latanoprost (XALATAN) 0.005 % ophthalmic solution Place 1 drop into both eyes at bedtime.    . Multiple Vitamin (MULTIVITAMIN) capsule Take 1 capsule by mouth daily.    . ONE TOUCH ULTRA TEST test strip TEST twice a day - DIAGNOSIS CODE 250.01 100 each 11  . OVER THE COUNTER MEDICATION Take 1 tablet by mouth daily. Focus Factor    . timolol (TIMOPTIC) 0.5 % ophthalmic solution timolol maleate 0.5 % eye drops    . vitamin C (ASCORBIC ACID) 500 MG tablet Take 500 mg by mouth 2 (two) times daily.     Marland Kitchen atorvastatin (LIPITOR) 40 MG tablet Take 1 tablet (40 mg total) by mouth daily. 90 tablet 3  . rosuvastatin (CRESTOR) 5 MG tablet Take 5 mg by mouth 3 (three) times a week.     No current facility-administered medications for this visit.    LABS/IMAGING: No results found for this or any previous visit (from the past 48 hour(s)). No results found.  VITALS: BP 130/71   Pulse 62   Ht 5\' 3"  (1.6 m)   Wt 173 lb 9.6 oz (78.7 kg)   SpO2 96%   BMI 30.75 kg/m   EXAM: General appearance: alert and no distress Neck: no carotid bruit and no JVD Lungs: clear to auscultation bilaterally Heart: regular rate and rhythm Abdomen: soft, non-tender; bowel sounds normal; no masses,  no organomegaly Extremities: extremities normal,  atraumatic, no cyanosis or edema Pulses: 2+ and symmetric Skin: Skin color, texture, turgor normal. No rashes or lesions Neurologic: Grossly normal Psych: Pleasant  EKG: Normal sinus rhythm's 62, minimal voltage criteria for LVH-personally reviewed  ASSESSMENT: 1. Chest pressure, with and without exertion - negative nuclear stress test 11/14 (resolved) 2. Insulin-dependent diabetes with neuropathy 3. Hypertension-controlled 4. Dyslipidemia 5. Family history of coronary disease 6. Probable GERD  PLAN: 1.   Ms. [name] has uncontrolled dyslipidemia and was intolerant or noncompliant with atorvastatin.  She was switched to rosuvastatin 5 mg 3 times a week.  I suspect her cholesterol may still be elevated with this.  Let us check it again today.  Need to further uptitrate her medication.  Her blood pressure is well controlled.  Finally, advised her to be on aspirin 81 mg daily based on the ACE recommendations for aspirin use in diabetics.  Follow-up annually or sooner as necessary  Pixie Casino, MD, FACC, [place] Director of the [facility]  [facility] Diplomate of the American Board of Clinical Lipidology Attending Cardiologist  Direct Dial: [phone]  Fax: [phone]  Website:  [email or web address] 02/08/2021, 2:04 PM

## 2021-02-09 ENCOUNTER — Encounter: Payer: Self-pay | Admitting: Internal Medicine

## 2021-02-09 DIAGNOSIS — H524 Presbyopia: Secondary | ICD-10-CM | POA: Diagnosis not present

## 2021-02-09 DIAGNOSIS — E119 Type 2 diabetes mellitus without complications: Secondary | ICD-10-CM | POA: Diagnosis not present

## 2021-02-09 DIAGNOSIS — H401133 Primary open-angle glaucoma, bilateral, severe stage: Secondary | ICD-10-CM | POA: Diagnosis not present

## 2021-02-09 DIAGNOSIS — H02051 Trichiasis without entropian right upper eyelid: Secondary | ICD-10-CM | POA: Diagnosis not present

## 2021-02-09 LAB — LIPID PANEL
Chol/HDL Ratio: 3.6 ratio (ref 0.0–4.4)
Cholesterol, Total: 156 mg/dL (ref 100–199)
HDL: 43 mg/dL (ref >39)
LDL Chol Calc (NIH): 79 mg/dL (ref 0–99)
Triglycerides: 200 mg/dL — ABNORMAL HIGH (ref 0–149)
VLDL Cholesterol Cal: 34 mg/dL (ref 5–40)

## 2021-02-09 LAB — HEMOGLOBIN A1C
Est. average glucose Bld gHb Est-mCnc: 209 mg/dL
Hgb A1c MFr Bld: 8.9 % — ABNORMAL HIGH (ref 4.8–5.6)

## 2021-02-14 NOTE — Telephone Encounter (Signed)
Patient has not called back to reschedule. Closing call.

## 2021-02-16 DIAGNOSIS — Z794 Long term (current) use of insulin: Secondary | ICD-10-CM | POA: Diagnosis not present

## 2021-02-16 DIAGNOSIS — E118 Type 2 diabetes mellitus with unspecified complications: Secondary | ICD-10-CM | POA: Diagnosis not present

## 2021-02-22 DIAGNOSIS — E7800 Pure hypercholesterolemia, unspecified: Secondary | ICD-10-CM | POA: Diagnosis not present

## 2021-02-22 DIAGNOSIS — I10 Essential (primary) hypertension: Secondary | ICD-10-CM | POA: Diagnosis not present

## 2021-02-22 DIAGNOSIS — E1165 Type 2 diabetes mellitus with hyperglycemia: Secondary | ICD-10-CM | POA: Diagnosis not present

## 2021-03-01 DIAGNOSIS — E1165 Type 2 diabetes mellitus with hyperglycemia: Secondary | ICD-10-CM | POA: Diagnosis not present

## 2021-03-01 DIAGNOSIS — E7800 Pure hypercholesterolemia, unspecified: Secondary | ICD-10-CM | POA: Diagnosis not present

## 2021-03-01 DIAGNOSIS — E1140 Type 2 diabetes mellitus with diabetic neuropathy, unspecified: Secondary | ICD-10-CM | POA: Diagnosis not present

## 2021-03-01 DIAGNOSIS — E1129 Type 2 diabetes mellitus with other diabetic kidney complication: Secondary | ICD-10-CM | POA: Diagnosis not present

## 2021-03-15 ENCOUNTER — Encounter: Payer: Self-pay | Admitting: Physical Medicine and Rehabilitation

## 2021-03-15 ENCOUNTER — Other Ambulatory Visit: Payer: Self-pay

## 2021-03-15 ENCOUNTER — Ambulatory Visit (INDEPENDENT_AMBULATORY_CARE_PROVIDER_SITE_OTHER): Payer: Medicare Other | Admitting: Physical Medicine and Rehabilitation

## 2021-03-15 VITALS — BP 150/76 | HR 78

## 2021-03-15 DIAGNOSIS — M5116 Intervertebral disc disorders with radiculopathy, lumbar region: Secondary | ICD-10-CM

## 2021-03-15 DIAGNOSIS — M47816 Spondylosis without myelopathy or radiculopathy, lumbar region: Secondary | ICD-10-CM

## 2021-03-15 DIAGNOSIS — M5416 Radiculopathy, lumbar region: Secondary | ICD-10-CM

## 2021-03-15 DIAGNOSIS — M48062 Spinal stenosis, lumbar region with neurogenic claudication: Secondary | ICD-10-CM | POA: Diagnosis not present

## 2021-03-15 NOTE — Progress Notes (Signed)
[name] - 85 y.o. female MRN [id number]  Date of birth: 06-26-1936  Office Visit Note: Visit Date: 03/15/2021 PCP: Janie Morning, DO Referred by: Janie Morning, DO  Subjective: Chief Complaint  Patient presents with  . Lower Back - Pain  . Left Knee - Pain  . Right Hip - Pain   HPI: [name] is a 85 y.o. female who comes in today At the request of Dr. Joni [name] for evaluation management of chronic worsening severe low back pain with some referral into the right and left hip at times.  More right hip and left knee pain.  Her case is complicated by insulin-dependent diabetes.  Patient was originally going to be looked at for fast-track epidural injection for her symptoms given that MRI findings did show moderate stenosis at L4-5 with listhesis and small disc tear.  She was very unsure about getting an injection and the risk involved and was just very anxious.  We did bring her in today to review her case with her.  We did pull up the MRI and did go over that at great length today.  We looked at images and spine models.  She does have moderate stenosis at L4-5 and I do think that is where her pain is coming from.  I showed her exactly how we do the injection and how the procedure goes.  I do think epidural injection is the right answer.  She has multilevel facet arthropathy that could be causing her back pain and we explained that to her as well.  Her case is complicated with the diabetes and I did talk to her about cortisone injections and diabetes management.  She has had cortisone injections by Dr. Durward Fortes in the past for her knees.  In terms of her back pain she reports chronic worsening recalcitrant low back pain mostly into the right buttock but sometimes left leg.  She reports this is worse with standing and walking.  She also gets a lot of pain riding in the car and getting out of bed.  She rates her average pain is a 5 out of 10 and 0 now just sitting in the  chair.  It is intermittent dull and aching.  She has had therapy and exercises which she continues to do she does report some relief with rest at times.  She does use some medications.  She is on a relatively low-dose of gabapentin.  She has had no focal weakness no bowel or bladder issues.  No prior lumbar surgery.  Review of Systems  Musculoskeletal: Positive for back pain and joint pain.  All other systems reviewed and are negative.  Otherwise per HPI.  Assessment & Plan: Visit Diagnoses:    ICD-10-CM   1. Spinal stenosis of lumbar region with neurogenic claudication  M48.062   2. Lumbar radiculopathy  M54.16   3. Radiculopathy due to lumbar intervertebral disc disorder  M51.16   4. Spondylosis without myelopathy or radiculopathy, lumbar region  M47.816      Plan: Findings:  Chronic worsening severe low back pain with right buttock pain consistent with neurogenic claudication and lumbar stenosis but also small disc herniation.  Any of those could cause similar symptoms she has a mixture of clinical signs and symptoms of both problems of stenosis and the disc protrusion or tear.  She likely has some back pain from the facet arthritis.  She continues with home exercises.  She seems to be happier after we went over  this with her and I do feel like epidural injection would help her diagnostically and therapeutically.  We talked about long-term use of epidural injections from time to time if it is very helpful.  Discussed with her the surgical approach to this to some degree although she is probably not a great surgical candidate.  She does want proceed with epidural injection which we will schedule.  I did give her a prescription for preprocedure Valium to see for 1 antianxiety standpoint.  This would be a right L4-5 interlaminar dural steroid injection.    Meds & Orders: No orders of the defined types were placed in this encounter.  No orders of the defined types were placed in this encounter.    Follow-up: Return for Right L4-5 interlaminar dural steroid injection.   Procedures: No procedures performed      Clinical History: MRI LUMBAR SPINE WITHOUT CONTRAST  TECHNIQUE: Multiplanar, multisequence MR imaging of the lumbar spine was performed. No intravenous contrast was administered.  COMPARISON:  Lumbar radiographs 10/30/2019  FINDINGS: Segmentation:  Normal  Alignment: 5 mm anterolisthesis L4-5. Slight retrolisthesis L1-2 and L2-3  Vertebrae: Negative for fracture or mass. Hemangioma L1 vertebral body.  Conus medullaris and cauda equina: Conus extends to the L1-2 level. Conus and cauda equina appear normal.  Paraspinal and other soft tissues: Negative for paraspinous mass or adenopathy.  Disc levels:  L1-2: Negative  L2-3: Mild disc and mild facet degeneration.  Negative for stenosis  L3-4: Disc degeneration with diffuse disc bulging and endplate spurring. Advanced facet and ligamentum flavum hypertrophy causing severe spinal stenosis. Moderate to severe subarticular stenosis bilaterally  L4-5: 5 mm anterolisthesis with diffuse disc bulging and left paracentral small disc protrusion with annular fissure. Advanced facet degeneration bilaterally causing moderate spinal stenosis. Moderate subarticular stenosis bilaterally left greater than right  L5-S1: Disc degeneration with diffuse disc bulging. Bilateral facet hypertrophy. Moderate subarticular and foraminal stenosis on the right.  IMPRESSION: Severe spinal stenosis L3-4 with moderate to severe subarticular stenosis bilaterally  5 mm anterolisthesis L4-5 with severe facet degeneration and moderate spinal stenosis.  Moderate subarticular and foraminal stenosis on the right L5-S1.   Electronically Signed   By: Franchot Gallo M.D.   On: 02/08/2020 07:31   She reports that she has never smoked. She has never used smokeless tobacco.  Recent Labs    02/08/21 1440  HGBA1C 8.9*     Objective:  VS:  HT:    WT:   BMI:     BP:(!) 150/76  HR:78bpm  TEMP: ( )  RESP:  Physical Exam Vitals and nursing note reviewed.  Constitutional:      General: She is not in acute distress.    Appearance: Normal appearance. She is not ill-appearing.  HENT:     Head: Normocephalic and atraumatic.     Right Ear: External ear normal.     Left Ear: External ear normal.  Eyes:     Extraocular Movements: Extraocular movements intact.  Cardiovascular:     Rate and Rhythm: Normal rate.     Pulses: Normal pulses.  Pulmonary:     Effort: Pulmonary effort is normal. No respiratory distress.  Abdominal:     General: There is no distension.     Palpations: Abdomen is soft.  Musculoskeletal:        General: Tenderness present.     Cervical back: Neck supple.     Right lower leg: No edema.     Left lower leg: No edema.  Comments: Patient has good distal strength with no pain over the greater trochanters.  No clonus or focal weakness.  She ambulates with a cane.  No pain with hip rotation.  She does have pain over the right PSIS about the left.  No specific focal trigger points.  Tightness in the bilateral quadratus lumborum.  Skin:    Findings: No erythema, lesion or rash.  Neurological:     General: No focal deficit present.     Mental Status: She is alert and oriented to person, place, and time.     Sensory: No sensory deficit.     Motor: No weakness or abnormal muscle tone.     Coordination: Coordination normal.  Psychiatric:        Mood and Affect: Mood normal.        Behavior: Behavior normal.     Ortho Exam  Imaging: Epidural Steroid injection  Result Date: 03/17/2021 Magnus Sinning, MD     03/18/2021  5:52 AM Lumbar Epidural Steroid Injection - Interlaminar Approach with Fluoroscopic Guidance Patient: HEELA HEISHMAN     Date of Birth: March 10, 1936 MRN: [id number] PCP: Janie Morning, DO     Visit Date: 03/17/2021  Universal Protocol:   Consent Given By: the patient  Position: PRONE Additional Comments: Vital signs were monitored before and after the procedure. Patient was prepped and draped in the usual sterile fashion. The correct patient, procedure, and site was verified. Injection Procedure Details: Procedure diagnoses: Lumbar radiculopathy [M54.16] Meds Administered: Meds ordered this encounter Medications . betamethasone acetate-betamethasone sodium phosphate (CELESTONE) injection 12 mg  Laterality: Right Location/Site:  L4-L5 Needle: 3.5 in., 20 ga. Tuohy Needle Placement: Paramedian epidural Findings:  -Comments: Excellent flow of contrast into the epidural space. Procedure Details: Using a paramedian approach from the side mentioned above, the region overlying the inferior lamina was localized under fluoroscopic visualization and the soft tissues overlying this structure were infiltrated with 4 ml. of 1% Lidocaine without Epinephrine. The Tuohy needle was inserted into the epidural space using a paramedian approach. The epidural space was localized using loss of resistance along with counter oblique bi-planar fluoroscopic views.  After negative aspirate for air, blood, and CSF, a 2 ml. volume of Isovue-250 was injected into the epidural space and the flow of contrast was observed. Radiographs were obtained for documentation purposes.  The injectate was administered into the level noted above. Additional Comments: The patient tolerated the procedure well Dressing: 2 x 2 sterile gauze and Band-Aid  Post-procedure details: Patient was observed during the procedure. Post-procedure instructions were reviewed. Patient left the clinic in stable condition.   XR C-ARM NO REPORT  Result Date: 03/17/2021 Please see Notes tab for imaging impression.   Past Medical/Family/Surgical/Social History: Medications & Allergies reviewed per EMR, new medications updated. Patient Active Problem List   Diagnosis Date Noted  . Low back pain 10/30/2019  . Uterine prolapse without  vaginal wall prolapse 09/20/2018  . History of cystocele 09/20/2018  . Anemia 09/20/2018  . Fever, unspecified 09/20/2018  . Abdominal pain, epigastric 09/20/2018  . Nausea with vomiting 09/20/2018  . Recurrent ventral incisional hernia 09/20/2018  . Sepsis due to Gram negative bacteria ([place]) 09/20/2018  . CKD (chronic kidney disease) stage 3, GFR 30-59 ml/min ([facility]) 09/20/2018  . Anxiety 09/20/2018  . Diabetic neuropathy ([place]) 09/20/2018  . E coli bacteremia 09/20/2018  . Sepsis ([place]) 09/19/2018  . Palpitations 03/12/2017  . Prolapse of anterior vaginal wall 06/03/2014  . Chest pressure 09/16/2013  . GERD (  gastroesophageal reflux disease) 09/16/2013  . Pain in limb 03/16/2008  . Type 1 diabetes, controlled, with neuropathy ([place]) 08/13/2007  . Hyperlipidemia 08/13/2007  . HTN (hypertension) 08/13/2007  . INSOMNIA 08/13/2007  . WEIGHT GAIN 08/13/2007   Past Medical History:  Diagnosis Date  . Diabetic neuropathy ([place])   . GERD (gastroesophageal reflux disease)   . HYPERLIPIDEMIA 08/13/2007  . HYPERTENSION 08/13/2007  . INSOMNIA 08/13/2007  . Palpitations   . SVD (spontaneous vaginal delivery)    x 2  . Type 2 DM with ketoacidosis ([place]) 08/13/2007   Family History  Problem Relation Age of Onset  . Kidney disease Brother   . Diabetes Brother   . Colon cancer Brother   . Diabetes Mother   . Diabetes Father   . Heart Problems Father   . Kidney disease Sister   . Colon cancer Sister   . Diabetes Sister   . Multiple sclerosis Child   . Hypertension Child        x2  . Diabetes Child        x2   Past Surgical History:  Procedure Laterality Date  . ANTERIOR AND POSTERIOR REPAIR N/A 06/03/2014   Procedure: ANTERIOR (CYSTOCELE) AND POSTERIOR REPAIR (RECTOCELE);  Surgeon: Melina Schools, MD;  Location: [facility];  Service: Gynecology;  Laterality: N/A;  . COLONOSCOPY    . DILATION AND CURETTAGE OF UTERUS    . EYE SURGERY     bilateral cataracts  . LAPAROSCOPIC APPENDECTOMY   2004   Dr Lindwood Qua  . right hand surgery    . Stress Cardiolite  02/19/2007  . UMBILICAL HERNIA REPAIR  2004   Primary repair at time of appendectomy.  Dr Deon Pilling  . VAGINAL HYSTERECTOMY N/A 06/03/2014   Procedure: HYSTERECTOMY VAGINAL;  Surgeon: Melina Schools, MD;  Location: [facility];  Service: Gynecology;  Laterality: N/A;  2hrs OR time   Social History   Occupational History  . Not on file  Tobacco Use  . Smoking status: Never Smoker  . Smokeless tobacco: Never Used  Substance and Sexual Activity  . Alcohol use: No  . Drug use: No  . Sexual activity: Yes    Birth control/protection: Post-menopausal

## 2021-03-15 NOTE — Progress Notes (Signed)
Pt state lower back pain that travels to her right buttock and down to her left knee. Pt state if she standing for long time it makes the pin worse. Pt state when riding in a car makes the pain worse. Pt state getting out of bed in the morning she feels stiff and cant sleep at night  Numeric Pain Rating Scale and Functional Assessment Average Pain 5 Pain Right Now 0 My pain is intermittent, dull and aching Pain is worse with: sitting, standing and some activites Pain improves with: rest, therapy/exercise and medication   In the last MONTH (on 0-10 scale) has pain interfered with the following?  1. General activity like being  able to carry out your everyday physical activities such as walking, climbing stairs, carrying groceries, or moving a chair?  Rating(4)  2. Relation with others like being able to carry out your usual social activities and roles such as  activities at home, at work and in your community. Rating(5)  3. Enjoyment of life such that you have  been bothered by emotional problems such as feeling anxious, depressed or irritable?  Rating(5)

## 2021-03-17 ENCOUNTER — Other Ambulatory Visit: Payer: Self-pay

## 2021-03-17 ENCOUNTER — Encounter: Payer: Self-pay | Admitting: Physical Medicine and Rehabilitation

## 2021-03-17 ENCOUNTER — Ambulatory Visit (INDEPENDENT_AMBULATORY_CARE_PROVIDER_SITE_OTHER): Payer: Medicare Other | Admitting: Physical Medicine and Rehabilitation

## 2021-03-17 ENCOUNTER — Ambulatory Visit: Payer: Self-pay

## 2021-03-17 VITALS — BP 148/77 | HR 76

## 2021-03-17 DIAGNOSIS — M5416 Radiculopathy, lumbar region: Secondary | ICD-10-CM | POA: Diagnosis not present

## 2021-03-17 MED ORDER — BETAMETHASONE SOD PHOS & ACET 6 (3-3) MG/ML IJ SUSP
12.0000 mg | Freq: Once | INTRAMUSCULAR | Status: AC
  Administered 2021-03-17: 12 mg

## 2021-03-17 NOTE — Patient Instructions (Signed)

## 2021-03-17 NOTE — Progress Notes (Signed)
Pt state Lower back pain mostly on the right side. Pt state sitting and riding in a car makes the pain worse. Pt state she take over the counter pain meds and heating pads.  Numeric Pain Rating Scale and Functional Assessment Average Pain 10   In the last MONTH (on 0-10 scale) has pain interfered with the following?  1. General activity like being  able to carry out your everyday physical activities such as walking, climbing stairs, carrying groceries, or moving a chair?  Rating(10)   +Driver, -BT, -Dye Allergies.

## 2021-03-18 ENCOUNTER — Encounter: Payer: Self-pay | Admitting: Physical Medicine and Rehabilitation

## 2021-03-18 DIAGNOSIS — E118 Type 2 diabetes mellitus with unspecified complications: Secondary | ICD-10-CM | POA: Diagnosis not present

## 2021-03-18 DIAGNOSIS — Z794 Long term (current) use of insulin: Secondary | ICD-10-CM | POA: Diagnosis not present

## 2021-03-18 NOTE — Progress Notes (Signed)
[name] - 85 y.o. female MRN [id number]  Date of birth: 06/17/1936  Office Visit Note: Visit Date: 03/17/2021 PCP: Janie Morning, DO Referred by: Janie Morning, DO  Subjective: Chief Complaint  Patient presents with  . Lower Back - Pain   HPI:  [name] is a 85 y.o. female who comes in today  for planned Right L4-L5 Lumbar epidural steroid injection with fluoroscopic guidance.  The patient has failed conservative care including home exercise, medications, time and activity modification.  This injection will be diagnostic and hopefully therapeutic.  Please see requesting physician notes for further details and justification.   ROS Otherwise per HPI.  Assessment & Plan: Visit Diagnoses:    ICD-10-CM   1. Lumbar radiculopathy  M54.16 XR C-ARM NO REPORT    Epidural Steroid injection    betamethasone acetate-betamethasone sodium phosphate (CELESTONE) injection 12 mg    Plan: No additional findings.   Meds & Orders:  Meds ordered this encounter  Medications  . betamethasone acetate-betamethasone sodium phosphate (CELESTONE) injection 12 mg    Orders Placed This Encounter  Procedures  . XR C-ARM NO REPORT  . Epidural Steroid injection    Follow-up: Return if symptoms worsen or fail to improve.   Procedures: No procedures performed  Lumbar Epidural Steroid Injection - Interlaminar Approach with Fluoroscopic Guidance  Patient: [name]      Date of Birth: 11-22-1935 MRN: [id number] PCP: Janie Morning, DO      Visit Date: 03/17/2021   Universal Protocol:     Consent Given By: the patient  Position: PRONE  Additional Comments: Vital signs were monitored before and after the procedure. Patient was prepped and draped in the usual sterile fashion. The correct patient, procedure, and site was verified.   Injection Procedure Details:   Procedure diagnoses: Lumbar radiculopathy [M54.16]   Meds Administered:  Meds ordered this encounter   Medications  . betamethasone acetate-betamethasone sodium phosphate (CELESTONE) injection 12 mg     Laterality: Right  Location/Site:  L4-L5  Needle: 3.5 in., 20 ga. Tuohy  Needle Placement: Paramedian epidural  Findings:   -Comments: Excellent flow of contrast into the epidural space.  Procedure Details: Using a paramedian approach from the side mentioned above, the region overlying the inferior lamina was localized under fluoroscopic visualization and the soft tissues overlying this structure were infiltrated with 4 ml. of 1% Lidocaine without Epinephrine. The Tuohy needle was inserted into the epidural space using a paramedian approach.   The epidural space was localized using loss of resistance along with counter oblique bi-planar fluoroscopic views.  After negative aspirate for air, blood, and CSF, a 2 ml. volume of Isovue-250 was injected into the epidural space and the flow of contrast was observed. Radiographs were obtained for documentation purposes.    The injectate was administered into the level noted above.   Additional Comments:  The patient tolerated the procedure well Dressing: 2 x 2 sterile gauze and Band-Aid    Post-procedure details: Patient was observed during the procedure. Post-procedure instructions were reviewed.  Patient left the clinic in stable condition.     Clinical History: MRI LUMBAR SPINE WITHOUT CONTRAST  TECHNIQUE: Multiplanar, multisequence MR imaging of the lumbar spine was performed. No intravenous contrast was administered.  COMPARISON:  Lumbar radiographs 10/30/2019  FINDINGS: Segmentation:  Normal  Alignment: 5 mm anterolisthesis L4-5. Slight retrolisthesis L1-2 and L2-3  Vertebrae: Negative for fracture or mass. Hemangioma L1 vertebral body.  Conus medullaris and cauda equina: Conus  extends to the L1-2 level. Conus and cauda equina appear normal.  Paraspinal and other soft tissues: Negative for paraspinous mass  or adenopathy.  Disc levels:  L1-2: Negative  L2-3: Mild disc and mild facet degeneration.  Negative for stenosis  L3-4: Disc degeneration with diffuse disc bulging and endplate spurring. Advanced facet and ligamentum flavum hypertrophy causing severe spinal stenosis. Moderate to severe subarticular stenosis bilaterally  L4-5: 5 mm anterolisthesis with diffuse disc bulging and left paracentral small disc protrusion with annular fissure. Advanced facet degeneration bilaterally causing moderate spinal stenosis. Moderate subarticular stenosis bilaterally left greater than right  L5-S1: Disc degeneration with diffuse disc bulging. Bilateral facet hypertrophy. Moderate subarticular and foraminal stenosis on the right.  IMPRESSION: Severe spinal stenosis L3-4 with moderate to severe subarticular stenosis bilaterally  5 mm anterolisthesis L4-5 with severe facet degeneration and moderate spinal stenosis.  Moderate subarticular and foraminal stenosis on the right L5-S1.   Electronically Signed   By: Franchot Gallo M.D.   On: 02/08/2020 07:31     Objective:  VS:  HT:    WT:   BMI:     BP:(!) 148/77  HR:76bpm  TEMP: ( )  RESP:  Physical Exam Vitals and nursing note reviewed.  Constitutional:      General: She is not in acute distress.    Appearance: Normal appearance. She is not ill-appearing.  HENT:     Head: Normocephalic and atraumatic.     Right Ear: External ear normal.     Left Ear: External ear normal.  Eyes:     Extraocular Movements: Extraocular movements intact.  Cardiovascular:     Rate and Rhythm: Normal rate.     Pulses: Normal pulses.  Pulmonary:     Effort: Pulmonary effort is normal. No respiratory distress.  Abdominal:     General: There is no distension.     Palpations: Abdomen is soft.  Musculoskeletal:        General: Tenderness present.     Cervical back: Neck supple.     Right lower leg: No edema.     Left lower leg: No edema.      Comments: Patient has good distal strength with no pain over the greater trochanters.  No clonus or focal weakness.  Skin:    Findings: No erythema, lesion or rash.  Neurological:     General: No focal deficit present.     Mental Status: She is alert and oriented to person, place, and time.     Sensory: No sensory deficit.     Motor: No weakness or abnormal muscle tone.     Coordination: Coordination normal.  Psychiatric:        Mood and Affect: Mood normal.        Behavior: Behavior normal.      Imaging: XR C-ARM NO REPORT  Result Date: 03/17/2021 Please see Notes tab for imaging impression.

## 2021-03-18 NOTE — Procedures (Signed)
Lumbar Epidural Steroid Injection - Interlaminar Approach with Fluoroscopic Guidance  Patient: [name]      Date of Birth: 01-19-1936 MRN: [id number] PCP: Janie Morning, DO      Visit Date: 03/17/2021   Universal Protocol:     Consent Given By: the patient  Position: PRONE  Additional Comments: Vital signs were monitored before and after the procedure. Patient was prepped and draped in the usual sterile fashion. The correct patient, procedure, and site was verified.   Injection Procedure Details:   Procedure diagnoses: Lumbar radiculopathy [M54.16]   Meds Administered:  Meds ordered this encounter  Medications  . betamethasone acetate-betamethasone sodium phosphate (CELESTONE) injection 12 mg     Laterality: Right  Location/Site:  L4-L5  Needle: 3.5 in., 20 ga. Tuohy  Needle Placement: Paramedian epidural  Findings:   -Comments: Excellent flow of contrast into the epidural space.  Procedure Details: Using a paramedian approach from the side mentioned above, the region overlying the inferior lamina was localized under fluoroscopic visualization and the soft tissues overlying this structure were infiltrated with 4 ml. of 1% Lidocaine without Epinephrine. The Tuohy needle was inserted into the epidural space using a paramedian approach.   The epidural space was localized using loss of resistance along with counter oblique bi-planar fluoroscopic views.  After negative aspirate for air, blood, and CSF, a 2 ml. volume of Isovue-250 was injected into the epidural space and the flow of contrast was observed. Radiographs were obtained for documentation purposes.    The injectate was administered into the level noted above.   Additional Comments:  The patient tolerated the procedure well Dressing: 2 x 2 sterile gauze and Band-Aid    Post-procedure details: Patient was observed during the procedure. Post-procedure instructions were reviewed.  Patient left the  clinic in stable condition.

## 2021-04-18 DIAGNOSIS — E118 Type 2 diabetes mellitus with unspecified complications: Secondary | ICD-10-CM | POA: Diagnosis not present

## 2021-04-18 DIAGNOSIS — Z794 Long term (current) use of insulin: Secondary | ICD-10-CM | POA: Diagnosis not present

## 2021-05-04 ENCOUNTER — Other Ambulatory Visit: Payer: Self-pay

## 2021-05-04 ENCOUNTER — Encounter: Payer: Self-pay | Admitting: Orthopaedic Surgery

## 2021-05-04 ENCOUNTER — Ambulatory Visit (INDEPENDENT_AMBULATORY_CARE_PROVIDER_SITE_OTHER): Payer: Medicare Other | Admitting: Orthopaedic Surgery

## 2021-05-04 VITALS — Ht 63.0 in | Wt 173.0 lb

## 2021-05-04 DIAGNOSIS — M5441 Lumbago with sciatica, right side: Secondary | ICD-10-CM

## 2021-05-04 NOTE — Progress Notes (Signed)
Office Visit Note   Patient: [name]           Date of Birth: 09-09-1936           MRN: [id number] Visit Date: 05/04/2021              Requested by: Janie Morning, DO [address] [place],  [place] [address] PCP: Janie Morning, DO   Assessment & Plan: Visit Diagnoses:  1. Right-sided low back pain with right-sided sciatica, unspecified chronicity     Plan: Mrs. Durand was recently seen by Dr. Ernestina Patches about 6 weeks ago for an epidural steroid injection.  An MRI scan last year demonstrated significant stenosis at L3-4 and L4-5 associated with diffuse degenerative changes.  She notes it made a big difference but in the last several weeks she has developed some pain in her buttock and into her right leg as far distally as her ankle.  Her straight leg raise is negative and she has excellent strength.  I think it is worth having Dr. Ernestina Patches reevaluate her for another epidural steroid injection.  I have discussed surgical decompression with her but she is not interested in any surgery at "my age".  Follow-Up Instructions: Return if symptoms worsen or fail to improve.   Orders:  No orders of the defined types were placed in this encounter.  No orders of the defined types were placed in this encounter.     Procedures: No procedures performed   Clinical Data: No additional findings.   Subjective: Chief Complaint  Patient presents with   Lower Back - Pain  Patient presents today for her lower back. She was last seen with Dr.Whitfield in February of this year. She had an ESI with Dr.Newton on 03/17/2021. She said that the injection helped for about 3 weeks. She developed a new pain on her right side last week. She said that it travels down the lateral side of her right leg. No injury. She takes Tylenol if needed.   HPI  Review of Systems   Objective: Vital Signs: Ht 5\' 3"  (1.6 m)   Wt 173 lb (78.5 kg)   BMI 30.65 kg/m   Physical Exam Constitutional:       Appearance: She is well-developed.  Pulmonary:     Effort: Pulmonary effort is normal.  Skin:    General: Skin is warm and dry.  Neurological:     Mental Status: She is alert and oriented to person, place, and time.  Psychiatric:        Behavior: Behavior normal.    Ortho Exam straight leg raise negative bilaterally.  No percussible tenderness of the lumbar spine.  No pain over either greater trochanter.  Painless range of motion both hips.  Appears to have good strength in both lower extremities.  Might have some slight altered sensibility in both of her feet.  Patient is diabetic  Specialty Comments:  No specialty comments available.  Imaging: No results found.   PMFS History: Patient Active Problem List   Diagnosis Date Noted   Low back pain 10/30/2019   Uterine prolapse without vaginal wall prolapse 09/20/2018   History of cystocele 09/20/2018   Anemia 09/20/2018   Fever, unspecified 09/20/2018   Abdominal pain, epigastric 09/20/2018   Nausea with vomiting 09/20/2018   Recurrent ventral incisional hernia 09/20/2018   Sepsis due to Gram negative bacteria ([place]) 09/20/2018   CKD (chronic kidney disease) stage 3, GFR 30-59 ml/min (HCC) 09/20/2018   Anxiety 09/20/2018  Diabetic neuropathy ([place]) 09/20/2018   E coli bacteremia 09/20/2018   Sepsis ([place]) 09/19/2018   Palpitations 03/12/2017   Prolapse of anterior vaginal wall 06/03/2014   Chest pressure 09/16/2013   GERD (gastroesophageal reflux disease) 09/16/2013   Pain in limb 03/16/2008   Type 1 diabetes, controlled, with neuropathy ([place]) 08/13/2007   Hyperlipidemia 08/13/2007   HTN (hypertension) 08/13/2007   INSOMNIA 08/13/2007   WEIGHT GAIN 08/13/2007   Past Medical History:  Diagnosis Date   Diabetic neuropathy ([place])    GERD (gastroesophageal reflux disease)    HYPERLIPIDEMIA 08/13/2007   HYPERTENSION 08/13/2007   INSOMNIA 08/13/2007   Palpitations    SVD (spontaneous vaginal delivery)    x 2   Type 2  DM with ketoacidosis ([place]) 08/13/2007    Family History  Problem Relation Age of Onset   Kidney disease Brother    Diabetes Brother    Colon cancer Brother    Diabetes Mother    Diabetes Father    Heart Problems Father    Kidney disease Sister    Colon cancer Sister    Diabetes Sister    Multiple sclerosis Child    Hypertension Child        x2   Diabetes Child        x2    Past Surgical History:  Procedure Laterality Date   ANTERIOR AND POSTERIOR REPAIR N/A 06/03/2014   Procedure: ANTERIOR (CYSTOCELE) AND POSTERIOR REPAIR (RECTOCELE);  Surgeon: Melina Schools, MD;  Location: [facility];  Service: Gynecology;  Laterality: N/A;   COLONOSCOPY     DILATION AND CURETTAGE OF UTERUS     EYE SURGERY     bilateral cataracts   LAPAROSCOPIC APPENDECTOMY  2004   Dr Lindwood Qua   right hand surgery     Stress Cardiolite  65/79/0383   UMBILICAL HERNIA REPAIR  2004   Primary repair at time of appendectomy.  Dr Deon Pilling   VAGINAL HYSTERECTOMY N/A 06/03/2014   Procedure: HYSTERECTOMY VAGINAL;  Surgeon: Melina Schools, MD;  Location: [facility];  Service: Gynecology;  Laterality: N/A;  2hrs OR time   Social History   Occupational History   Not on file  Tobacco Use   Smoking status: Never   Smokeless tobacco: Never  Substance and Sexual Activity   Alcohol use: No   Drug use: No   Sexual activity: Yes    Birth control/protection: Post-menopausal

## 2021-05-04 NOTE — Addendum Note (Signed)
Addended by: Lendon Collar on: 05/04/2021 02:20 PM   Modules accepted: Orders

## 2021-05-05 ENCOUNTER — Telehealth: Payer: Self-pay

## 2021-05-05 DIAGNOSIS — F411 Generalized anxiety disorder: Secondary | ICD-10-CM

## 2021-05-05 NOTE — Telephone Encounter (Signed)
Pt req Rx for her appt on 7/12

## 2021-05-09 ENCOUNTER — Telehealth: Payer: Self-pay | Admitting: Physical Medicine and Rehabilitation

## 2021-05-09 NOTE — Telephone Encounter (Signed)
Patient saw Dr. Durward Fortes on 6/22 and was referred to you for another injection. Scheduled for a right L4 TF on 7/13. Please advise.

## 2021-05-09 NOTE — Telephone Encounter (Signed)
Patient's daughter [name] called asked if there is something else Dr Ernestina Patches can do other than the injection. [name] asked if MRI or X-ray can be done before moving forward with the injection? The number to contact [name] is  [phone]

## 2021-05-10 NOTE — Telephone Encounter (Signed)
Patient's daughter states that she has not really had a new injury. She states that she was in a training class and was lifting 10 pound weights and noticed the pain after that. She also states that the patient "hit her butt on a doorknob this year." She states that the patient is willing to try another injection but she does not really want more.

## 2021-05-11 NOTE — Telephone Encounter (Signed)
Called daughter to advise. They are going to try another injection.

## 2021-05-12 DIAGNOSIS — E118 Type 2 diabetes mellitus with unspecified complications: Secondary | ICD-10-CM | POA: Diagnosis not present

## 2021-05-12 DIAGNOSIS — Z794 Long term (current) use of insulin: Secondary | ICD-10-CM | POA: Diagnosis not present

## 2021-05-23 MED ORDER — DIAZEPAM 5 MG PO TABS: ORAL_TABLET | ORAL | 0 refills | Status: AC

## 2021-05-23 NOTE — Addendum Note (Signed)
Addended by: Raymondo Band on: 05/23/2021 04:33 PM   Modules accepted: Orders

## 2021-05-24 ENCOUNTER — Telehealth: Payer: Self-pay

## 2021-05-24 ENCOUNTER — Ambulatory Visit: Payer: Medicare Other | Admitting: Physical Medicine and Rehabilitation

## 2021-05-24 NOTE — Telephone Encounter (Signed)
Returned patient's call to reschedule. No answer after multiple rings and no voicemail.

## 2021-05-24 NOTE — Telephone Encounter (Signed)
Patient called she wants to cancel her appointment  for this afternoon patient would like a call back to r/s call [phone]

## 2021-06-08 DIAGNOSIS — N368 Other specified disorders of urethra: Secondary | ICD-10-CM | POA: Diagnosis not present

## 2021-06-09 ENCOUNTER — Telehealth: Payer: Self-pay | Admitting: Physical Medicine and Rehabilitation

## 2021-06-09 NOTE — Telephone Encounter (Signed)
Patient's daughter Luellen Pucker called needing to cancel her mother's appointment. She advised do not want to reschedule the appointment at this time

## 2021-06-09 NOTE — Telephone Encounter (Signed)
Appointment cancelled

## 2021-06-11 DIAGNOSIS — E118 Type 2 diabetes mellitus with unspecified complications: Secondary | ICD-10-CM | POA: Diagnosis not present

## 2021-06-11 DIAGNOSIS — Z794 Long term (current) use of insulin: Secondary | ICD-10-CM | POA: Diagnosis not present

## 2021-06-13 ENCOUNTER — Ambulatory Visit: Payer: Medicare Other | Admitting: Physical Medicine and Rehabilitation

## 2021-06-20 DIAGNOSIS — H401133 Primary open-angle glaucoma, bilateral, severe stage: Secondary | ICD-10-CM | POA: Diagnosis not present

## 2021-06-20 DIAGNOSIS — H02051 Trichiasis without entropian right upper eyelid: Secondary | ICD-10-CM | POA: Diagnosis not present

## 2021-07-04 DIAGNOSIS — E1140 Type 2 diabetes mellitus with diabetic neuropathy, unspecified: Secondary | ICD-10-CM | POA: Diagnosis not present

## 2021-07-04 DIAGNOSIS — E1129 Type 2 diabetes mellitus with other diabetic kidney complication: Secondary | ICD-10-CM | POA: Diagnosis not present

## 2021-07-04 DIAGNOSIS — E1165 Type 2 diabetes mellitus with hyperglycemia: Secondary | ICD-10-CM | POA: Diagnosis not present

## 2021-07-04 DIAGNOSIS — E7800 Pure hypercholesterolemia, unspecified: Secondary | ICD-10-CM | POA: Diagnosis not present

## 2021-07-11 DIAGNOSIS — B372 Candidiasis of skin and nail: Secondary | ICD-10-CM | POA: Diagnosis not present

## 2021-07-12 DIAGNOSIS — E118 Type 2 diabetes mellitus with unspecified complications: Secondary | ICD-10-CM | POA: Diagnosis not present

## 2021-07-12 DIAGNOSIS — Z794 Long term (current) use of insulin: Secondary | ICD-10-CM | POA: Diagnosis not present

## 2021-07-13 DIAGNOSIS — E1165 Type 2 diabetes mellitus with hyperglycemia: Secondary | ICD-10-CM | POA: Diagnosis not present

## 2021-07-13 DIAGNOSIS — K219 Gastro-esophageal reflux disease without esophagitis: Secondary | ICD-10-CM | POA: Diagnosis not present

## 2021-07-13 DIAGNOSIS — E7800 Pure hypercholesterolemia, unspecified: Secondary | ICD-10-CM | POA: Diagnosis not present

## 2021-08-10 DIAGNOSIS — Z794 Long term (current) use of insulin: Secondary | ICD-10-CM | POA: Diagnosis not present

## 2021-08-10 DIAGNOSIS — E118 Type 2 diabetes mellitus with unspecified complications: Secondary | ICD-10-CM | POA: Diagnosis not present

## 2021-08-11 DIAGNOSIS — Z23 Encounter for immunization: Secondary | ICD-10-CM | POA: Diagnosis not present

## 2021-09-09 DIAGNOSIS — E118 Type 2 diabetes mellitus with unspecified complications: Secondary | ICD-10-CM | POA: Diagnosis not present

## 2021-09-09 DIAGNOSIS — Z794 Long term (current) use of insulin: Secondary | ICD-10-CM | POA: Diagnosis not present

## 2021-10-10 DIAGNOSIS — Z794 Long term (current) use of insulin: Secondary | ICD-10-CM | POA: Diagnosis not present

## 2021-10-10 DIAGNOSIS — E118 Type 2 diabetes mellitus with unspecified complications: Secondary | ICD-10-CM | POA: Diagnosis not present

## 2021-10-12 DIAGNOSIS — H401133 Primary open-angle glaucoma, bilateral, severe stage: Secondary | ICD-10-CM | POA: Diagnosis not present

## 2021-10-13 DIAGNOSIS — E7800 Pure hypercholesterolemia, unspecified: Secondary | ICD-10-CM | POA: Diagnosis not present

## 2021-10-13 DIAGNOSIS — I129 Hypertensive chronic kidney disease with stage 1 through stage 4 chronic kidney disease, or unspecified chronic kidney disease: Secondary | ICD-10-CM | POA: Diagnosis not present

## 2021-10-13 DIAGNOSIS — E1129 Type 2 diabetes mellitus with other diabetic kidney complication: Secondary | ICD-10-CM | POA: Diagnosis not present

## 2021-10-13 DIAGNOSIS — E559 Vitamin D deficiency, unspecified: Secondary | ICD-10-CM | POA: Diagnosis not present

## 2021-10-20 DIAGNOSIS — N1831 Chronic kidney disease, stage 3a: Secondary | ICD-10-CM | POA: Diagnosis not present

## 2021-10-20 DIAGNOSIS — Z0000 Encounter for general adult medical examination without abnormal findings: Secondary | ICD-10-CM | POA: Diagnosis not present

## 2021-10-20 DIAGNOSIS — I129 Hypertensive chronic kidney disease with stage 1 through stage 4 chronic kidney disease, or unspecified chronic kidney disease: Secondary | ICD-10-CM | POA: Diagnosis not present

## 2021-10-20 DIAGNOSIS — E1122 Type 2 diabetes mellitus with diabetic chronic kidney disease: Secondary | ICD-10-CM | POA: Diagnosis not present

## 2021-11-08 DIAGNOSIS — Z794 Long term (current) use of insulin: Secondary | ICD-10-CM | POA: Diagnosis not present

## 2021-11-08 DIAGNOSIS — E118 Type 2 diabetes mellitus with unspecified complications: Secondary | ICD-10-CM | POA: Diagnosis not present

## 2021-12-05 DIAGNOSIS — E7800 Pure hypercholesterolemia, unspecified: Secondary | ICD-10-CM | POA: Diagnosis not present

## 2021-12-05 DIAGNOSIS — E1122 Type 2 diabetes mellitus with diabetic chronic kidney disease: Secondary | ICD-10-CM | POA: Diagnosis not present

## 2021-12-05 DIAGNOSIS — N1831 Chronic kidney disease, stage 3a: Secondary | ICD-10-CM | POA: Diagnosis not present

## 2021-12-05 DIAGNOSIS — I10 Essential (primary) hypertension: Secondary | ICD-10-CM | POA: Diagnosis not present

## 2021-12-09 DIAGNOSIS — Z794 Long term (current) use of insulin: Secondary | ICD-10-CM | POA: Diagnosis not present

## 2021-12-09 DIAGNOSIS — E118 Type 2 diabetes mellitus with unspecified complications: Secondary | ICD-10-CM | POA: Diagnosis not present

## 2022-01-03 DIAGNOSIS — E1122 Type 2 diabetes mellitus with diabetic chronic kidney disease: Secondary | ICD-10-CM | POA: Diagnosis not present

## 2022-01-03 DIAGNOSIS — E1165 Type 2 diabetes mellitus with hyperglycemia: Secondary | ICD-10-CM | POA: Diagnosis not present

## 2022-01-09 DIAGNOSIS — Z794 Long term (current) use of insulin: Secondary | ICD-10-CM | POA: Diagnosis not present

## 2022-01-09 DIAGNOSIS — E118 Type 2 diabetes mellitus with unspecified complications: Secondary | ICD-10-CM | POA: Diagnosis not present

## 2022-02-01 DIAGNOSIS — H02051 Trichiasis without entropian right upper eyelid: Secondary | ICD-10-CM | POA: Diagnosis not present

## 2022-02-01 DIAGNOSIS — H524 Presbyopia: Secondary | ICD-10-CM | POA: Diagnosis not present

## 2022-02-01 DIAGNOSIS — E119 Type 2 diabetes mellitus without complications: Secondary | ICD-10-CM | POA: Diagnosis not present

## 2022-02-01 DIAGNOSIS — H401133 Primary open-angle glaucoma, bilateral, severe stage: Secondary | ICD-10-CM | POA: Diagnosis not present

## 2022-02-08 DIAGNOSIS — Z794 Long term (current) use of insulin: Secondary | ICD-10-CM | POA: Diagnosis not present

## 2022-02-08 DIAGNOSIS — E118 Type 2 diabetes mellitus with unspecified complications: Secondary | ICD-10-CM | POA: Diagnosis not present

## 2022-02-09 ENCOUNTER — Ambulatory Visit: Payer: Medicare Other | Admitting: Physician Assistant

## 2022-02-28 DIAGNOSIS — M542 Cervicalgia: Secondary | ICD-10-CM | POA: Diagnosis not present

## 2022-02-28 DIAGNOSIS — M5030 Other cervical disc degeneration, unspecified cervical region: Secondary | ICD-10-CM | POA: Diagnosis not present

## 2022-03-09 NOTE — Progress Notes (Deleted)
Office Visit    Patient Name: [name] Date of Encounter: 03/09/2022  Primary Care Provider:  Janie Morning, DO Primary Cardiologist:  Pixie Casino, MD  Chief Complaint    86 year old female with a history of atypical chest pain, palpitations, hypertension, hyperlipidemia, type 2 diabetes, insomnia and GERD who presents for follow-up related to hypertension and hyperlipidemia.  Past Medical History    Past Medical History:  Diagnosis Date   Diabetic neuropathy ([place])    GERD (gastroesophageal reflux disease)    HYPERLIPIDEMIA 08/13/2007   HYPERTENSION 08/13/2007   INSOMNIA 08/13/2007   Palpitations    SVD (spontaneous vaginal delivery)    x 2   Type 2 DM with ketoacidosis ([place]) 08/13/2007   Past Surgical History:  Procedure Laterality Date   ANTERIOR AND POSTERIOR REPAIR N/A 06/03/2014   Procedure: ANTERIOR (CYSTOCELE) AND POSTERIOR REPAIR (RECTOCELE);  Surgeon: Melina Schools, MD;  Location: [facility];  Service: Gynecology;  Laterality: N/A;   COLONOSCOPY     DILATION AND CURETTAGE OF UTERUS     EYE SURGERY     bilateral cataracts   LAPAROSCOPIC APPENDECTOMY  2004   Dr Lindwood Qua   right hand surgery     Stress Cardiolite  93/90/3009   UMBILICAL HERNIA REPAIR  2004   Primary repair at time of appendectomy.  Dr Deon Pilling   VAGINAL HYSTERECTOMY N/A 06/03/2014   Procedure: HYSTERECTOMY VAGINAL;  Surgeon: Melina Schools, MD;  Location: [facility];  Service: Gynecology;  Laterality: N/A;  2hrs OR time    Allergies  No Known Allergies  History of Present Illness    86 year old female with the above past medical history including atypical chest pain, palpitations, hypertension, hyperlipidemia, type 2 diabetes, insomnia and GERD.  Lexiscan Myoview in 2014 was negative for ischemia.  ABIs in 2018 in setting of leg pain were normal.  Echocardiogram in November 2019 showed EF 65 to 70%, no RWMA, G1 DD, mild aortic valve regurgitation, mild tricuspid valve regurgitation.   He was last seen in the office on 02/08/2021 and was stable from a cardiac standpoint.  She had stopped taking her atorvastatin, her PCP had subsequently switched her to rosuvastatin 5 mg 3 times weekly.  Follow-up was recommended in 1 year.  She presents today for follow-up.  Since her last visit  Atypical chest pain: Hypertension: Hyperlipidemia: Type 2 diabetes: Disposition:   Home Medications    Current Outpatient Medications  Medication Sig Dispense Refill   atorvastatin (LIPITOR) 40 MG tablet Take 1 tablet (40 mg total) by mouth daily. 90 tablet 3   B-D ULTRAFINE III SHORT PEN 31G X 8 MM MISC use 3 to 4 TIMES A DAY as directed by prescriber 100 each 6   bisoprolol-hydrochlorothiazide (ZIAC) 2.5-6.25 MG per tablet Take 1 tablet by mouth daily. 30 tablet 0   Calcium Carb-Cholecalciferol 600-200 MG-UNIT TABS Take 2 tablets by mouth daily.     Cholecalciferol (VITAMIN D-3) 1000 UNITS CAPS Take 1 capsule by mouth daily.     clorazepate (TRANXENE) 7.5 MG tablet Take 7.5 mg by mouth 2 (two) times daily as needed for anxiety.     diazepam (VALIUM) 5 MG tablet Take 1 by mouth 1 hour  pre-procedure with very light food. May bring 2nd tablet to appointment. 2 tablet 0   dorzolamide-timolol (COSOPT) 22.3-6.8 MG/ML ophthalmic solution Place 1 drop into both eyes 2 (two) times daily.   3   FARXIGA 10 MG TABS tablet Take 10 mg by  mouth daily.     gabapentin (NEURONTIN) 100 MG capsule Take 100-200 mg by mouth 2 (two) times daily as needed (pain).     HUMALOG KWIKPEN 100 UNIT/ML KiwkPen Inject 12-18 Units into the skin 3 (three) times daily.  0   insulin degludec (TRESIBA) 100 UNIT/ML FlexTouch Pen Inject 30-45 Units into the skin daily at 10 pm.      latanoprost (XALATAN) 0.005 % ophthalmic solution Place 1 drop into both eyes at bedtime.     Multiple Vitamin (MULTIVITAMIN) capsule Take 1 capsule by mouth daily.     ONE TOUCH ULTRA TEST test strip TEST twice a day - DIAGNOSIS CODE 250.01 100 each  11   OVER THE COUNTER MEDICATION Take 1 tablet by mouth daily. Focus Factor     rosuvastatin (CRESTOR) 5 MG tablet Take 5 mg by mouth 3 (three) times a week.     timolol (TIMOPTIC) 0.5 % ophthalmic solution timolol maleate 0.5 % eye drops     vitamin C (ASCORBIC ACID) 500 MG tablet Take 500 mg by mouth 2 (two) times daily.      No current facility-administered medications for this visit.     Review of Systems    ***.  All other systems reviewed and are otherwise negative except as noted above.    Physical Exam    VS:  There were no vitals taken for this visit. , BMI There is no height or weight on file to calculate BMI.     GEN: Well nourished, well developed, in no acute distress. HEENT: normal. Neck: Supple, no JVD, carotid bruits, or masses. Cardiac: RRR, no murmurs, rubs, or gallops. No clubbing, cyanosis, edema.  Radials/DP/PT 2+ and equal bilaterally.  Respiratory:  Respirations regular and unlabored, clear to auscultation bilaterally. GI: Soft, nontender, nondistended, BS + x 4. MS: no deformity or atrophy. Skin: warm and dry, no rash. Neuro:  Strength and sensation are intact. Psych: Normal affect.  Accessory Clinical Findings    ECG personally reviewed by me today - *** - no acute changes.  Lab Results  Component Value Date   WBC 3.3 (L) 09/22/2018   HGB 11.4 (L) 09/22/2018   HCT 36.0 09/22/2018   MCV 84.5 09/22/2018   PLT 261 09/22/2018   Lab Results  Component Value Date   CREATININE 0.97 09/20/2018   BUN 13 09/20/2018   NA 143 09/20/2018   K 3.8 09/20/2018   CL 110 09/20/2018   CO2 23 09/20/2018   Lab Results  Component Value Date   ALT 196 (H) 09/20/2018   AST 193 (H) 09/20/2018   ALKPHOS 75 09/20/2018   BILITOT 2.1 (H) 09/20/2018   Lab Results  Component Value Date   CHOL 156 02/08/2021   HDL 43 02/08/2021   LDLCALC 79 02/08/2021   TRIG 200 (H) 02/08/2021   CHOLHDL 3.6 02/08/2021    Lab Results  Component Value Date   HGBA1C 8.9 (H)  02/08/2021    Assessment & Plan    1.  ***   Lenna Sciara, NP 03/09/2022, 9:45 AM

## 2022-03-10 ENCOUNTER — Ambulatory Visit: Payer: Medicare Other | Admitting: Nurse Practitioner

## 2022-03-10 DIAGNOSIS — Z794 Long term (current) use of insulin: Secondary | ICD-10-CM | POA: Diagnosis not present

## 2022-03-10 DIAGNOSIS — E118 Type 2 diabetes mellitus with unspecified complications: Secondary | ICD-10-CM | POA: Diagnosis not present

## 2022-03-14 NOTE — Progress Notes (Deleted)
Cardiology Office Note:    Date:  03/14/2022   ID:  [name], DOB Jun 28, 1936, MRN [id number]  PCP:  Janie Morning, [place] Cardiologist: Pixie Casino, MD   Reason for visit: 1 year follow-up  History of Present Illness:    [name] is a 86 y.o. female with a hx of diabetes, dyslipidemia, hypertension, GERD.  Patient last saw Dr. Debara Pickett in March 2022.  Lipids checked with LDL 79, triglycerides 200, HDL 43.  Recommended continuing Crestor '5mg'$  3x weekly.  .   Ms. Conkright has uncontrolled dyslipidemia and was intolerant or noncompliant with atorvastatin.  She was switched to rosuvastatin 5 mg 3 times a week.  I suspect her cholesterol may still be elevated with this.  Let us check it again today.  Need to further uptitrate her medication.  Her blood pressure is well controlled.  Finally, advised her to be on aspirin 81 mg daily based on the ACE recommendations for aspirin use in diabetics.  Today, ***  Hypertension -*** -Goal BP is <130/80.  Recommend DASH diet (high in vegetables, fruits, low-fat dairy products, whole grains, poultry, fish, and nuts and low in sweets, sugar-sweetened beverages, and red meats), salt restriction and increase physical activity.  Hyperlipidemia -*** -Discussed cholesterol lowering diets - Mediterranean diet, DASH diet, vegetarian diet, low-carbohydrate diet and avoidance of trans fats.  Discussed healthier choice substitutes.  Nuts, high-fiber foods, and fiber supplements may also improve lipids.    Obesity -Discussed how even a 5-10% weight loss can have cardiovascular benefits.   -Recommend moderate intensity activity for 30 minutes 5 days/week and the DASH diet.  Disposition - Follow-up in ***     Past Medical History:  Diagnosis Date   Diabetic neuropathy ([place])    GERD (gastroesophageal reflux disease)    HYPERLIPIDEMIA 08/13/2007   HYPERTENSION 08/13/2007   INSOMNIA 08/13/2007   Palpitations    SVD  (spontaneous vaginal delivery)    x 2   Type 2 DM with ketoacidosis ([place]) 08/13/2007    Past Surgical History:  Procedure Laterality Date   ANTERIOR AND POSTERIOR REPAIR N/A 06/03/2014   Procedure: ANTERIOR (CYSTOCELE) AND POSTERIOR REPAIR (RECTOCELE);  Surgeon: Melina Schools, MD;  Location: [facility];  Service: Gynecology;  Laterality: N/A;   COLONOSCOPY     DILATION AND CURETTAGE OF UTERUS     EYE SURGERY     bilateral cataracts   LAPAROSCOPIC APPENDECTOMY  2004   Dr Lindwood Qua   right hand surgery     Stress Cardiolite  52/77/8242   UMBILICAL HERNIA REPAIR  2004   Primary repair at time of appendectomy.  Dr Deon Pilling   VAGINAL HYSTERECTOMY N/A 06/03/2014   Procedure: HYSTERECTOMY VAGINAL;  Surgeon: Melina Schools, MD;  Location: [facility];  Service: Gynecology;  Laterality: N/A;  2hrs OR time    Current Medications: No outpatient medications have been marked as taking for the 03/15/22 encounter (Appointment) with Warren Lacy, PA-C.     Allergies:   Patient has no known allergies.   Social History   Socioeconomic History   Marital status: Widowed    Spouse name: Not on file   Number of children: Not on file   Years of education: Not on file   Highest education level: Not on file  Occupational History   Not on file  Tobacco Use   Smoking status: Never   Smokeless tobacco: Never  Substance and Sexual Activity   Alcohol use: No  Drug use: No   Sexual activity: Yes    Birth control/protection: Post-menopausal  Other Topics Concern   Not on file  Social History Narrative   Not on file   Social Determinants of Health   Financial Resource Strain: Not on file  Food Insecurity: Not on file  Transportation Needs: Not on file  Physical Activity: Not on file  Stress: Not on file  Social Connections: Not on file     Family History: The patient's family history includes Colon cancer in her brother and sister; Diabetes in her brother, child, father, mother, and  sister; Heart Problems in her father; Hypertension in her child; Kidney disease in her brother and sister; Multiple sclerosis in her child.  ROS:   Please see the history of present illness.     EKGs/Labs/Other Studies Reviewed:    EKG:  The ekg ordered today demonstrates ***  Recent Labs: No results found for requested labs within last 8760 hours.   Recent Lipid Panel Lab Results  Component Value Date/Time   CHOL 156 02/08/2021 02:40 PM   TRIG 200 (H) 02/08/2021 02:40 PM   HDL 43 02/08/2021 02:40 PM   [place] 79 02/08/2021 02:40 PM    Physical Exam:    VS:  There were no vitals taken for this visit.   No data found.  Wt Readings from Last 3 Encounters:  05/04/21 173 lb (78.5 kg)  02/08/21 173 lb 9.6 oz (78.7 kg)  01/06/21 166 lb (75.3 kg)     GEN: *** Well nourished, well developed in no acute distress HEENT: Normal NECK: No JVD; No carotid bruits CARDIAC: ***RRR, no murmurs, rubs, gallops RESPIRATORY:  Clear to auscultation without rales, wheezing or rhonchi  ABDOMEN: Soft, non-tender, non-distended MUSCULOSKELETAL: No edema; No deformity  SKIN: Warm and dry NEUROLOGIC:  Alert and oriented PSYCHIATRIC:  Normal affect     ASSESSMENT AND PLAN   ***   {Are you ordering a CV Procedure (e.g. stress test, cath, DCCV, TEE, etc)?   Press F2        :[id number]}    Medication Adjustments/Labs and Tests Ordered: Current medicines are reviewed at length with the patient today.  Concerns regarding medicines are outlined above.  No orders of the defined types were placed in this encounter.  No orders of the defined types were placed in this encounter.   There are no Patient Instructions on file for this visit.   Signed, Warren Lacy, PA-C  03/14/2022 10:20 PM    [facility]

## 2022-03-15 ENCOUNTER — Ambulatory Visit: Payer: Medicare Other | Admitting: Physician Assistant

## 2022-03-15 DIAGNOSIS — E782 Mixed hyperlipidemia: Secondary | ICD-10-CM

## 2022-03-15 DIAGNOSIS — I10 Essential (primary) hypertension: Secondary | ICD-10-CM

## 2022-03-17 ENCOUNTER — Ambulatory Visit (INDEPENDENT_AMBULATORY_CARE_PROVIDER_SITE_OTHER): Payer: Medicare Other | Admitting: Podiatry

## 2022-03-17 ENCOUNTER — Encounter: Payer: Self-pay | Admitting: Podiatry

## 2022-03-17 DIAGNOSIS — M79674 Pain in right toe(s): Secondary | ICD-10-CM | POA: Diagnosis not present

## 2022-03-17 DIAGNOSIS — M79675 Pain in left toe(s): Secondary | ICD-10-CM | POA: Diagnosis not present

## 2022-03-17 DIAGNOSIS — B351 Tinea unguium: Secondary | ICD-10-CM

## 2022-03-17 DIAGNOSIS — E139 Other specified diabetes mellitus without complications: Secondary | ICD-10-CM | POA: Diagnosis not present

## 2022-03-17 NOTE — Progress Notes (Signed)
Subjective:  ? ?Patient ID: [name], female   DOB: 86 y.o.   MRN: [id number]  ? ?HPI ?Patient presents with caregiver with elongated nails 1-5 both feet that become painful and long-term diabetes under reasonable control.  Patient does have good mental acuity does not smoke likes to be active ? ? ?Review of Systems  ?All other systems reviewed and are negative. ? ? ?   ?Objective:  ?Physical Exam ?Vitals and nursing note reviewed.  ?Constitutional:   ?   Appearance: She is well-developed.  ?Pulmonary:  ?   Effort: Pulmonary effort is normal.  ?Musculoskeletal:     ?   General: Normal range of motion.  ?Skin: ?   General: Skin is warm.  ?Neurological:  ?   Mental Status: She is alert.  ?  ?Neurovascular status intact muscle strength found to be adequate range of motion within normal limits.  Patient is found to have nail disease 1-5 both feet that are thickened incurvated and painful and does have only mild diminishment sharp dull vibratory currently.  Good digital perfusion well oriented ? ?   ?Assessment:  ?Chronic mycotic nail infection that becomes painful 1-5 both feet with long-term diabetes ? ?   ?Plan:  ?H&P advised on daily inspections and did diabetic evaluation today and debrided nailbeds 1-5 both feet neurogenic bleeding reappoint for routine care ?   ? ? ?

## 2022-03-21 ENCOUNTER — Encounter: Payer: Self-pay | Admitting: Physician Assistant

## 2022-04-09 DIAGNOSIS — Z794 Long term (current) use of insulin: Secondary | ICD-10-CM | POA: Diagnosis not present

## 2022-04-09 DIAGNOSIS — E118 Type 2 diabetes mellitus with unspecified complications: Secondary | ICD-10-CM | POA: Diagnosis not present

## 2022-05-23 DIAGNOSIS — E1140 Type 2 diabetes mellitus with diabetic neuropathy, unspecified: Secondary | ICD-10-CM | POA: Diagnosis not present

## 2022-05-23 DIAGNOSIS — E7800 Pure hypercholesterolemia, unspecified: Secondary | ICD-10-CM | POA: Diagnosis not present

## 2022-05-23 DIAGNOSIS — I10 Essential (primary) hypertension: Secondary | ICD-10-CM | POA: Diagnosis not present

## 2022-05-23 DIAGNOSIS — E1165 Type 2 diabetes mellitus with hyperglycemia: Secondary | ICD-10-CM | POA: Diagnosis not present

## 2022-05-31 DIAGNOSIS — Z794 Long term (current) use of insulin: Secondary | ICD-10-CM | POA: Diagnosis not present

## 2022-05-31 DIAGNOSIS — E118 Type 2 diabetes mellitus with unspecified complications: Secondary | ICD-10-CM | POA: Diagnosis not present

## 2022-06-21 DIAGNOSIS — H02051 Trichiasis without entropian right upper eyelid: Secondary | ICD-10-CM | POA: Diagnosis not present

## 2022-06-21 DIAGNOSIS — H401133 Primary open-angle glaucoma, bilateral, severe stage: Secondary | ICD-10-CM | POA: Diagnosis not present

## 2022-06-30 DIAGNOSIS — E118 Type 2 diabetes mellitus with unspecified complications: Secondary | ICD-10-CM | POA: Diagnosis not present

## 2022-06-30 DIAGNOSIS — Z794 Long term (current) use of insulin: Secondary | ICD-10-CM | POA: Diagnosis not present

## 2022-07-30 DIAGNOSIS — Z794 Long term (current) use of insulin: Secondary | ICD-10-CM | POA: Diagnosis not present

## 2022-07-30 DIAGNOSIS — E118 Type 2 diabetes mellitus with unspecified complications: Secondary | ICD-10-CM | POA: Diagnosis not present

## 2022-08-18 ENCOUNTER — Ambulatory Visit: Payer: Medicare Other | Attending: Internal Medicine | Admitting: Internal Medicine

## 2022-08-23 DIAGNOSIS — Z794 Long term (current) use of insulin: Secondary | ICD-10-CM | POA: Diagnosis not present

## 2022-08-23 DIAGNOSIS — E118 Type 2 diabetes mellitus with unspecified complications: Secondary | ICD-10-CM | POA: Diagnosis not present

## 2022-09-22 DIAGNOSIS — Z794 Long term (current) use of insulin: Secondary | ICD-10-CM | POA: Diagnosis not present

## 2022-09-22 DIAGNOSIS — E118 Type 2 diabetes mellitus with unspecified complications: Secondary | ICD-10-CM | POA: Diagnosis not present

## 2022-10-18 DIAGNOSIS — E559 Vitamin D deficiency, unspecified: Secondary | ICD-10-CM | POA: Diagnosis not present

## 2022-10-18 DIAGNOSIS — E1122 Type 2 diabetes mellitus with diabetic chronic kidney disease: Secondary | ICD-10-CM | POA: Diagnosis not present

## 2022-10-18 DIAGNOSIS — R946 Abnormal results of thyroid function studies: Secondary | ICD-10-CM | POA: Diagnosis not present

## 2022-10-18 DIAGNOSIS — E7800 Pure hypercholesterolemia, unspecified: Secondary | ICD-10-CM | POA: Diagnosis not present

## 2022-10-18 DIAGNOSIS — I10 Essential (primary) hypertension: Secondary | ICD-10-CM | POA: Diagnosis not present

## 2022-10-22 DIAGNOSIS — E118 Type 2 diabetes mellitus with unspecified complications: Secondary | ICD-10-CM | POA: Diagnosis not present

## 2022-10-22 DIAGNOSIS — Z794 Long term (current) use of insulin: Secondary | ICD-10-CM | POA: Diagnosis not present

## 2022-10-25 DIAGNOSIS — H26492 Other secondary cataract, left eye: Secondary | ICD-10-CM | POA: Diagnosis not present

## 2022-10-25 DIAGNOSIS — H02051 Trichiasis without entropian right upper eyelid: Secondary | ICD-10-CM | POA: Diagnosis not present

## 2022-10-25 DIAGNOSIS — Z0000 Encounter for general adult medical examination without abnormal findings: Secondary | ICD-10-CM | POA: Diagnosis not present

## 2022-10-25 DIAGNOSIS — H52203 Unspecified astigmatism, bilateral: Secondary | ICD-10-CM | POA: Diagnosis not present

## 2022-10-25 DIAGNOSIS — H401113 Primary open-angle glaucoma, right eye, severe stage: Secondary | ICD-10-CM | POA: Diagnosis not present

## 2022-10-25 DIAGNOSIS — Z23 Encounter for immunization: Secondary | ICD-10-CM | POA: Diagnosis not present

## 2022-10-25 DIAGNOSIS — H524 Presbyopia: Secondary | ICD-10-CM | POA: Diagnosis not present

## 2022-10-25 DIAGNOSIS — Z794 Long term (current) use of insulin: Secondary | ICD-10-CM | POA: Diagnosis not present

## 2022-10-25 DIAGNOSIS — E1122 Type 2 diabetes mellitus with diabetic chronic kidney disease: Secondary | ICD-10-CM | POA: Diagnosis not present

## 2022-10-25 DIAGNOSIS — E119 Type 2 diabetes mellitus without complications: Secondary | ICD-10-CM | POA: Diagnosis not present

## 2022-10-25 DIAGNOSIS — H1789 Other corneal scars and opacities: Secondary | ICD-10-CM | POA: Diagnosis not present

## 2022-10-25 DIAGNOSIS — I129 Hypertensive chronic kidney disease with stage 1 through stage 4 chronic kidney disease, or unspecified chronic kidney disease: Secondary | ICD-10-CM | POA: Diagnosis not present

## 2022-11-17 DIAGNOSIS — Z794 Long term (current) use of insulin: Secondary | ICD-10-CM | POA: Diagnosis not present

## 2022-11-17 DIAGNOSIS — E118 Type 2 diabetes mellitus with unspecified complications: Secondary | ICD-10-CM | POA: Diagnosis not present

## 2022-11-23 DIAGNOSIS — E559 Vitamin D deficiency, unspecified: Secondary | ICD-10-CM | POA: Diagnosis not present

## 2022-11-23 DIAGNOSIS — E1122 Type 2 diabetes mellitus with diabetic chronic kidney disease: Secondary | ICD-10-CM | POA: Diagnosis not present

## 2022-11-23 DIAGNOSIS — E1129 Type 2 diabetes mellitus with other diabetic kidney complication: Secondary | ICD-10-CM | POA: Diagnosis not present

## 2022-11-23 DIAGNOSIS — E1165 Type 2 diabetes mellitus with hyperglycemia: Secondary | ICD-10-CM | POA: Diagnosis not present

## 2022-11-23 DIAGNOSIS — E7800 Pure hypercholesterolemia, unspecified: Secondary | ICD-10-CM | POA: Diagnosis not present

## 2022-11-30 DIAGNOSIS — E1122 Type 2 diabetes mellitus with diabetic chronic kidney disease: Secondary | ICD-10-CM | POA: Diagnosis not present

## 2022-11-30 DIAGNOSIS — I10 Essential (primary) hypertension: Secondary | ICD-10-CM | POA: Diagnosis not present

## 2022-11-30 DIAGNOSIS — E7800 Pure hypercholesterolemia, unspecified: Secondary | ICD-10-CM | POA: Diagnosis not present

## 2022-11-30 DIAGNOSIS — I129 Hypertensive chronic kidney disease with stage 1 through stage 4 chronic kidney disease, or unspecified chronic kidney disease: Secondary | ICD-10-CM | POA: Diagnosis not present

## 2022-12-17 DIAGNOSIS — Z794 Long term (current) use of insulin: Secondary | ICD-10-CM | POA: Diagnosis not present

## 2022-12-17 DIAGNOSIS — E118 Type 2 diabetes mellitus with unspecified complications: Secondary | ICD-10-CM | POA: Diagnosis not present

## 2023-01-16 DIAGNOSIS — E118 Type 2 diabetes mellitus with unspecified complications: Secondary | ICD-10-CM | POA: Diagnosis not present

## 2023-01-16 DIAGNOSIS — Z794 Long term (current) use of insulin: Secondary | ICD-10-CM | POA: Diagnosis not present

## 2023-02-18 DIAGNOSIS — Z794 Long term (current) use of insulin: Secondary | ICD-10-CM | POA: Diagnosis not present

## 2023-02-18 DIAGNOSIS — E118 Type 2 diabetes mellitus with unspecified complications: Secondary | ICD-10-CM | POA: Diagnosis not present

## 2023-03-01 DIAGNOSIS — E7800 Pure hypercholesterolemia, unspecified: Secondary | ICD-10-CM | POA: Diagnosis not present

## 2023-03-01 DIAGNOSIS — E1122 Type 2 diabetes mellitus with diabetic chronic kidney disease: Secondary | ICD-10-CM | POA: Diagnosis not present

## 2023-03-01 DIAGNOSIS — I10 Essential (primary) hypertension: Secondary | ICD-10-CM | POA: Diagnosis not present

## 2023-03-01 DIAGNOSIS — J309 Allergic rhinitis, unspecified: Secondary | ICD-10-CM | POA: Diagnosis not present

## 2023-03-07 ENCOUNTER — Telehealth: Payer: Self-pay | Admitting: Internal Medicine

## 2023-03-07 NOTE — Telephone Encounter (Signed)
Pt's daughter returning nurses call. Please advise 

## 2023-03-07 NOTE — Telephone Encounter (Signed)
Patient daughter states patient states "not my chest above my breast on left side, goes down her shoulder and left arm". No sweating, no dizziness, no SOB, no headache. Patient states happens every 3 days a week.  Sometimes it "last for a while" maybe 5-10 minutes. Feels like cramp or something draws over my upper chest. Sometimes across the right side but usually left side and this is where the pain comes in.  No excess stretching, moving of anything.  No excess activity.  Under a lot of stress. She states took a tylenol last night and again this morning and has had no pain since then.   Set appt. For Monday with NP . Advised if symptoms continue and do not resolve as they have in the past week , or if symptoms of N&V, sweating, extreme headache, or any weakness to face then she should go to ER immediatly.  She and her daughter both state understanding.

## 2023-03-07 NOTE — Telephone Encounter (Signed)
Pt c/o of Chest Pain: STAT if CP now or developed within 24 hours  1. Are you having CP right now? No  2. Are you experiencing any other symptoms (ex. SOB, nausea, vomiting, sweating)? No  3. How long have you been experiencing CP? A week or two  4. Is your CP continuous or coming and going? Coming and going  5. Have you taken Nitroglycerin? No  Patient's daughter stated that the pt is having chest pains that will run down both sides of her shoulders and in her arms. Feels like electricity running down and has a little tingling. Patient's daughter would like a call back at [phone]

## 2023-03-07 NOTE — Telephone Encounter (Signed)
Left message to return call to office.

## 2023-03-11 NOTE — Progress Notes (Unsigned)
Cardiology Clinic Note   Date: 03/12/2023 ID: SAN RUA, DOB [birth year], MRN [id number]  Primary Cardiologist:  Chrystie Nose, MD  Patient Profile    [name] is a 87 y.o. female who presents to the clinic today for evaluation of chest pain.  Past medical history significant for: Chest pain. Stress test 09/24/2013: Normal study. Echo 09/20/2018: EF 65 to 70%.  Grade I DD.  Moderately thickened/calcified aortic valve.  Mild AI.  Mild TR. Hypertension. Hyperlipidemia. Lipid panel 11/23/2022: LDL 64, HDL 39, TG 145, total 128. GERD. T1DM.   History of Present Illness    [name] was first evaluated by Dr. Rennis Golden on 09/16/2013 for chest pressure at the request of Dr. Renae Gloss.  She had a normal nuclear stress test.  She continues to be followed by Dr. Rennis Golden for the above outlined history.  Patient was last seen in the office by Dr. Rennis Golden on 02/08/2021 for routine follow-up.  At that time she was taking rosuvastatin 5 mg 3 times a week per her PCP.  She also reported she did not take a daily aspirin secondary to taking a full dose aspirin on occasion.  She was educated on the importance of daily aspirin secondary to diabetes and encouraged to take aspirin 81 mg daily.  Most recently, patient's daughter contacted triage on 03/07/2023 with complaints of chest pain"Patient daughter states patient states "not my chest above my breast on left side, goes down her shoulder and left arm". No sweating, no dizziness, no SOB, no headache. Patient states happens every 3 days a week.  Sometimes it "last for a while" maybe 5-10 minutes. Feels like cramp or something draws over my upper chest. Sometimes across the right side but usually left side and this is where the pain comes in. No excess stretching, moving of anything.  No excess activity.  Under a lot of stress. She states took a tylenol last night and again this morning and has had no pain since then."  Patient was provided  with ED precautions.  Today, patient is accompanied by her granddaughter. She complains of a week history of intermittent exertional central chest pain that radiates to the left and down her shoulder/arm and to her axilla with no associated symptoms. Nothing makes pain worse. She reports no response with Tylenol but it did get better with a full strength aspirin and rest. She denies new activities. She does report a stressful situation while driving with some family but states this patient started prior to that. She denies shortness of breath, DOE, lower extremity edema, orthopnea or PND.     ROS: All other systems reviewed and are otherwise negative except as noted in History of Present Illness.  Studies Reviewed    ECG personally reviewed by me today: NSR, 68 bpm no significant changes from 02/08/2021.   Physical Exam    VS:  BP (!) 120/56 (BP Location: Left Arm, Patient Position: Sitting, Cuff Size: Normal)   Pulse 68   Ht 5\' 4"  (1.626 m)   Wt 171 lb (77.6 kg)   BMI 29.35 kg/m  , BMI Body mass index is 29.35 kg/m.  GEN: Well nourished, well developed, in no acute distress. Neck: No JVD or carotid bruits. Cardiac:  RRR. No murmurs. No rubs or gallops.   Respiratory:  Respirations regular and unlabored. Clear to auscultation without rales, wheezing or rhonchi. GI: Soft, nontender, nondistended. Extremities: Radials/DP/PT 2+ and equal bilaterally. No clubbing or cyanosis. No edema.  Skin:  Warm and dry, no rash. Neuro: Strength intact.  Assessment & Plan    Precordial chest pain.  Normal nuclear stress test November 2014.  Patient described a week history of intermittent exertional central chest pain that radiates to left side of chest and down her shoulder/arm and to her axilla with no associated symptoms. Nothing makes the pain worse. Tylenol did not help pain but full strength aspirin did. She denies new activities. She is not tender to palpation.  EKG shows NSR, 68 bpm. Chest  pain with typical and atypical features.  Will get coronary CTA for further evaluation.  Will get BMP. Hypertension. BP today 120/56.  Patient denies headaches, dizziness or vision changes. Continue Ziac. Hyperlipidemia.  LDL January 2024 64, at goal.  Continue rosuvastatin 5 mg 3 times a week.  Disposition: Coronary CTA.  BMP today.  Return in 3 months or sooner as needed.        Signed, [name]. Beverly Yaklin, DNP, NP-C

## 2023-03-12 ENCOUNTER — Encounter: Payer: Self-pay | Admitting: Student

## 2023-03-12 ENCOUNTER — Ambulatory Visit: Payer: 59 | Attending: Student | Admitting: Student

## 2023-03-12 VITALS — BP 120/56 | HR 68 | Ht 64.0 in | Wt 171.0 lb

## 2023-03-12 DIAGNOSIS — E785 Hyperlipidemia, unspecified: Secondary | ICD-10-CM

## 2023-03-12 DIAGNOSIS — Z01812 Encounter for preprocedural laboratory examination: Secondary | ICD-10-CM

## 2023-03-12 DIAGNOSIS — R072 Precordial pain: Secondary | ICD-10-CM

## 2023-03-12 DIAGNOSIS — I10 Essential (primary) hypertension: Secondary | ICD-10-CM | POA: Diagnosis not present

## 2023-03-12 MED ORDER — METOPROLOL TARTRATE 100 MG PO TABS: ORAL_TABLET | ORAL | 0 refills | Status: AC

## 2023-03-12 NOTE — Patient Instructions (Addendum)
Medication Instructions:  Your physician recommends that you continue on your current medications as directed. Please refer to the Current Medication list given to you today.  *If you need a refill on your cardiac medications before your next appointment, please call your pharmacy*   Lab Work: Your physician recommends that you have the following lab drawn today: BMET  If you have labs (blood work) drawn today and your tests are completely normal, you will receive your results only by: MyChart Message (if you have MyChart) OR A paper copy in the mail If you have any lab test that is abnormal or we need to change your treatment, we will call you to review the results.   Testing/Procedures: Cardiac CT Angiography (CTA), is a special type of CT scan that uses a computer to produce multi-dimensional views of major blood vessels throughout the body. In CT angiography, a contrast material is injected through an IV to help visualize the blood vessels    Follow-Up: At [facility], you and your health needs are our priority.  As part of our continuing mission to provide you with exceptional heart care, we have created designated Provider Care Teams.  These Care Teams include your primary Cardiologist (physician) and Advanced Practice Providers (APPs -  Physician Assistants and Nurse Practitioners) who all work together to provide you with the care you need, when you need it.  We recommend signing up for the patient portal called "MyChart".  Sign up information is provided on this After Visit Summary.  MyChart is used to connect with patients for Virtual Visits (Telemedicine).  Patients are able to view lab/test results, encounter notes, upcoming appointments, etc.  Non-urgent messages can be sent to your provider as well.   To learn more about what you can do with MyChart, go to [email or web address].    Your next appointment:   3 month(s)  Provider:   Carlos Levering OR Chrystie Nose, MD   Other Instructions   Your cardiac CT will be scheduled at one of the below locations:   [facility] [address] [place], [place] [address] [phone]  If scheduled at [facility], please arrive at the [facility] and Children's Entrance (Entrance C2) of [facility] 30 minutes prior to test start time. You can use the FREE valet parking offered at entrance C (encouraged to control the heart rate for the test)  Proceed to the [facility] Radiology Department (first floor) to check-in and test prep.  All radiology patients and guests should use entrance C2 at [facility], accessed from [facility], even though the hospital's physical address listed is [address].    Please follow these instructions carefully (unless otherwise directed):  On the Night Before the Test: Be sure to Drink plenty of water. Do not consume any caffeinated/decaffeinated beverages or chocolate 12 hours prior to your test. Do not take any antihistamines 12 hours prior to your test.  On the Day of the Test: Drink plenty of water until 1 hour prior to the test. Do not eat any food 1 hour prior to test. You may take your regular medications prior to the test.  Take metoprolol (Lopressor) 100mg  two hours prior to test. If you take Furosemide/Hydrochlorothiazide/Spironolactone, please HOLD on the morning of the test. FEMALES- please wear underwire-free bra if available, avoid dresses & tight clothing       After the Test: Drink plenty of water. After receiving IV contrast, you  may experience a mild flushed feeling. This is normal. On occasion, you may experience a mild rash up to 24 hours after the test. This is not dangerous. If this occurs, you can take Benadryl 25 mg and increase your fluid intake. If you experience trouble breathing, this can be serious. If it is severe call 911 IMMEDIATELY. If it is mild, please call our office. If  you take any of these medications: Glipizide/Metformin, Avandament, Glucavance, please do not take 48 hours after completing test unless otherwise instructed.  We will call to schedule your test 2-4 weeks out understanding that some insurance companies will need an authorization prior to the service being performed.   For non-scheduling related questions, please contact the cardiac imaging nurse navigator should you have any questions/concerns: Rockwell Alexandria, Cardiac Imaging Nurse Navigator Larey Brick, Cardiac Imaging Nurse Navigator [facility] Direct Office Dial: [phone]   For scheduling needs, including cancellations and rescheduling, please call [place], [phone].

## 2023-03-13 LAB — BASIC METABOLIC PANEL
BUN/Creatinine Ratio: 15 (ref 12–28)
BUN: 16 mg/dL (ref 8–27)
CO2: 22 mmol/L (ref 20–29)
Calcium: 10.1 mg/dL (ref 8.7–10.3)
Chloride: 103 mmol/L (ref 96–106)
Creatinine, Ser: 1.04 mg/dL — ABNORMAL HIGH (ref 0.57–1.00)
Glucose: 149 mg/dL — ABNORMAL HIGH (ref 70–99)
Potassium: 4.9 mmol/L (ref 3.5–5.2)
Sodium: 142 mmol/L (ref 134–144)
eGFR: 53 mL/min/{1.73_m2} — ABNORMAL LOW (ref >59)

## 2023-03-14 ENCOUNTER — Telehealth: Payer: Self-pay | Admitting: Internal Medicine

## 2023-03-14 NOTE — Telephone Encounter (Signed)
Patient returned CMA's call regarding results. 

## 2023-03-14 NOTE — Telephone Encounter (Signed)
Per result note    Daryll Brod, [place] 03/14/2023  1:52 PM EDT Back to Top    Patient is aware of her lab results, Gave a verbal understanding.

## 2023-03-20 DIAGNOSIS — E118 Type 2 diabetes mellitus with unspecified complications: Secondary | ICD-10-CM | POA: Diagnosis not present

## 2023-03-20 DIAGNOSIS — Z794 Long term (current) use of insulin: Secondary | ICD-10-CM | POA: Diagnosis not present

## 2023-03-22 ENCOUNTER — Telehealth (HOSPITAL_COMMUNITY): Payer: Self-pay | Admitting: Emergency Medicine

## 2023-03-22 NOTE — Telephone Encounter (Signed)
Unable to leave vm Shawnell Dykes RN Navigator Cardiac Imaging [facility] [phone] Office  [phone] Cell  

## 2023-03-23 ENCOUNTER — Ambulatory Visit (HOSPITAL_COMMUNITY)
Admission: RE | Admit: 2023-03-23 | Discharge: 2023-03-23 | Disposition: A | Discharge status: Home / Self Care | Payer: 59 | Source: Ambulatory Visit | Attending: Student | Admitting: Student

## 2023-03-23 DIAGNOSIS — R072 Precordial pain: Secondary | ICD-10-CM | POA: Diagnosis not present

## 2023-03-23 MED ORDER — IOHEXOL 350 MG/ML SOLN
100.0000 mL | Freq: Once | INTRAVENOUS | Status: AC | PRN
  Administered 2023-03-23: 100 mL via INTRAVENOUS

## 2023-03-23 MED ORDER — NITROGLYCERIN 0.4 MG SL SUBL
SUBLINGUAL_TABLET | SUBLINGUAL | Status: AC
  Filled 2023-03-23: qty 2

## 2023-03-23 MED ORDER — NITROGLYCERIN 0.4 MG SL SUBL
0.8000 mg | SUBLINGUAL_TABLET | SUBLINGUAL | Status: DC | PRN
  Administered 2023-03-23: 0.8 mg via SUBLINGUAL

## 2023-04-19 DIAGNOSIS — E118 Type 2 diabetes mellitus with unspecified complications: Secondary | ICD-10-CM | POA: Diagnosis not present

## 2023-04-19 DIAGNOSIS — Z794 Long term (current) use of insulin: Secondary | ICD-10-CM | POA: Diagnosis not present

## 2023-05-07 DIAGNOSIS — H401133 Primary open-angle glaucoma, bilateral, severe stage: Secondary | ICD-10-CM | POA: Diagnosis not present

## 2023-05-18 DIAGNOSIS — E118 Type 2 diabetes mellitus with unspecified complications: Secondary | ICD-10-CM | POA: Diagnosis not present

## 2023-05-18 DIAGNOSIS — Z794 Long term (current) use of insulin: Secondary | ICD-10-CM | POA: Diagnosis not present

## 2023-06-17 DIAGNOSIS — Z794 Long term (current) use of insulin: Secondary | ICD-10-CM | POA: Diagnosis not present

## 2023-06-17 DIAGNOSIS — E118 Type 2 diabetes mellitus with unspecified complications: Secondary | ICD-10-CM | POA: Diagnosis not present

## 2023-07-02 DIAGNOSIS — E1122 Type 2 diabetes mellitus with diabetic chronic kidney disease: Secondary | ICD-10-CM | POA: Diagnosis not present

## 2023-07-02 DIAGNOSIS — I10 Essential (primary) hypertension: Secondary | ICD-10-CM | POA: Diagnosis not present

## 2023-07-02 DIAGNOSIS — E559 Vitamin D deficiency, unspecified: Secondary | ICD-10-CM | POA: Diagnosis not present

## 2023-07-02 DIAGNOSIS — E7800 Pure hypercholesterolemia, unspecified: Secondary | ICD-10-CM | POA: Diagnosis not present

## 2023-07-09 DIAGNOSIS — E1122 Type 2 diabetes mellitus with diabetic chronic kidney disease: Secondary | ICD-10-CM | POA: Diagnosis not present

## 2023-07-09 DIAGNOSIS — J309 Allergic rhinitis, unspecified: Secondary | ICD-10-CM | POA: Diagnosis not present

## 2023-07-09 DIAGNOSIS — E7800 Pure hypercholesterolemia, unspecified: Secondary | ICD-10-CM | POA: Diagnosis not present

## 2023-07-09 DIAGNOSIS — I10 Essential (primary) hypertension: Secondary | ICD-10-CM | POA: Diagnosis not present

## 2023-07-17 DIAGNOSIS — E118 Type 2 diabetes mellitus with unspecified complications: Secondary | ICD-10-CM | POA: Diagnosis not present

## 2023-07-17 DIAGNOSIS — Z794 Long term (current) use of insulin: Secondary | ICD-10-CM | POA: Diagnosis not present

## 2023-08-31 DIAGNOSIS — E118 Type 2 diabetes mellitus with unspecified complications: Secondary | ICD-10-CM | POA: Diagnosis not present

## 2023-09-12 DIAGNOSIS — H1789 Other corneal scars and opacities: Secondary | ICD-10-CM | POA: Diagnosis not present

## 2023-09-12 DIAGNOSIS — H02051 Trichiasis without entropian right upper eyelid: Secondary | ICD-10-CM | POA: Diagnosis not present

## 2023-09-12 DIAGNOSIS — H401133 Primary open-angle glaucoma, bilateral, severe stage: Secondary | ICD-10-CM | POA: Diagnosis not present

## 2023-09-12 DIAGNOSIS — E119 Type 2 diabetes mellitus without complications: Secondary | ICD-10-CM | POA: Diagnosis not present

## 2023-09-12 DIAGNOSIS — H26492 Other secondary cataract, left eye: Secondary | ICD-10-CM | POA: Diagnosis not present

## 2023-09-12 DIAGNOSIS — H52203 Unspecified astigmatism, bilateral: Secondary | ICD-10-CM | POA: Diagnosis not present

## 2023-09-12 DIAGNOSIS — H524 Presbyopia: Secondary | ICD-10-CM | POA: Diagnosis not present

## 2023-09-30 DIAGNOSIS — E118 Type 2 diabetes mellitus with unspecified complications: Secondary | ICD-10-CM | POA: Diagnosis not present

## 2023-10-08 DIAGNOSIS — I10 Essential (primary) hypertension: Secondary | ICD-10-CM | POA: Diagnosis not present

## 2023-10-08 DIAGNOSIS — E559 Vitamin D deficiency, unspecified: Secondary | ICD-10-CM | POA: Diagnosis not present

## 2023-10-08 DIAGNOSIS — E7800 Pure hypercholesterolemia, unspecified: Secondary | ICD-10-CM | POA: Diagnosis not present

## 2023-10-08 DIAGNOSIS — E1122 Type 2 diabetes mellitus with diabetic chronic kidney disease: Secondary | ICD-10-CM | POA: Diagnosis not present

## 2023-10-23 DIAGNOSIS — Z13220 Encounter for screening for lipoid disorders: Secondary | ICD-10-CM | POA: Diagnosis not present

## 2023-10-23 DIAGNOSIS — R946 Abnormal results of thyroid function studies: Secondary | ICD-10-CM | POA: Diagnosis not present

## 2023-10-23 DIAGNOSIS — Z79899 Other long term (current) drug therapy: Secondary | ICD-10-CM | POA: Diagnosis not present

## 2023-10-23 DIAGNOSIS — E559 Vitamin D deficiency, unspecified: Secondary | ICD-10-CM | POA: Diagnosis not present

## 2023-10-23 DIAGNOSIS — R7309 Other abnormal glucose: Secondary | ICD-10-CM | POA: Diagnosis not present

## 2023-10-30 DIAGNOSIS — N1831 Chronic kidney disease, stage 3a: Secondary | ICD-10-CM | POA: Diagnosis not present

## 2023-10-30 DIAGNOSIS — E1122 Type 2 diabetes mellitus with diabetic chronic kidney disease: Secondary | ICD-10-CM | POA: Diagnosis not present

## 2023-10-30 DIAGNOSIS — I129 Hypertensive chronic kidney disease with stage 1 through stage 4 chronic kidney disease, or unspecified chronic kidney disease: Secondary | ICD-10-CM | POA: Diagnosis not present

## 2023-10-30 DIAGNOSIS — Z0000 Encounter for general adult medical examination without abnormal findings: Secondary | ICD-10-CM | POA: Diagnosis not present

## 2023-10-30 DIAGNOSIS — E118 Type 2 diabetes mellitus with unspecified complications: Secondary | ICD-10-CM | POA: Diagnosis not present

## 2023-10-30 DIAGNOSIS — E1140 Type 2 diabetes mellitus with diabetic neuropathy, unspecified: Secondary | ICD-10-CM | POA: Diagnosis not present

## 2023-11-26 DIAGNOSIS — E118 Type 2 diabetes mellitus with unspecified complications: Secondary | ICD-10-CM | POA: Diagnosis not present

## 2023-12-24 ENCOUNTER — Encounter: Payer: Self-pay | Admitting: Gastroenterology

## 2023-12-26 DIAGNOSIS — E118 Type 2 diabetes mellitus with unspecified complications: Secondary | ICD-10-CM | POA: Diagnosis not present

## 2024-01-24 ENCOUNTER — Ambulatory Visit: Payer: 59 | Admitting: Gastroenterology

## 2024-01-25 DIAGNOSIS — E118 Type 2 diabetes mellitus with unspecified complications: Secondary | ICD-10-CM | POA: Diagnosis not present

## 2024-02-08 DIAGNOSIS — E1122 Type 2 diabetes mellitus with diabetic chronic kidney disease: Secondary | ICD-10-CM | POA: Diagnosis not present

## 2024-02-15 DIAGNOSIS — I10 Essential (primary) hypertension: Secondary | ICD-10-CM | POA: Diagnosis not present

## 2024-02-15 DIAGNOSIS — E7800 Pure hypercholesterolemia, unspecified: Secondary | ICD-10-CM | POA: Diagnosis not present

## 2024-02-15 DIAGNOSIS — E1122 Type 2 diabetes mellitus with diabetic chronic kidney disease: Secondary | ICD-10-CM | POA: Diagnosis not present

## 2024-02-15 DIAGNOSIS — E559 Vitamin D deficiency, unspecified: Secondary | ICD-10-CM | POA: Diagnosis not present

## 2024-02-25 DIAGNOSIS — E118 Type 2 diabetes mellitus with unspecified complications: Secondary | ICD-10-CM | POA: Diagnosis not present

## 2024-02-25 DIAGNOSIS — Z794 Long term (current) use of insulin: Secondary | ICD-10-CM | POA: Diagnosis not present

## 2024-02-26 ENCOUNTER — Encounter: Payer: Self-pay | Admitting: Internal Medicine

## 2024-02-26 ENCOUNTER — Ambulatory Visit: Payer: 59 | Admitting: Internal Medicine

## 2024-02-26 VITALS — BP 122/60 | HR 67 | Ht 64.0 in | Wt 173.0 lb

## 2024-02-26 DIAGNOSIS — K644 Residual hemorrhoidal skin tags: Secondary | ICD-10-CM

## 2024-02-26 DIAGNOSIS — R159 Full incontinence of feces: Secondary | ICD-10-CM

## 2024-02-26 DIAGNOSIS — Z860101 Personal history of adenomatous and serrated colon polyps: Secondary | ICD-10-CM

## 2024-02-26 DIAGNOSIS — K649 Unspecified hemorrhoids: Secondary | ICD-10-CM

## 2024-02-26 DIAGNOSIS — K5900 Constipation, unspecified: Secondary | ICD-10-CM | POA: Diagnosis not present

## 2024-02-26 DIAGNOSIS — R151 Fecal smearing: Secondary | ICD-10-CM | POA: Diagnosis not present

## 2024-02-26 MED ORDER — HYDROCORTISONE (PERIANAL) 2.5 % EX CREA: 1.0000 | TOPICAL_CREAM | Freq: Every day | CUTANEOUS | 1 refills | Status: AC

## 2024-02-26 NOTE — Progress Notes (Signed)
 HISTORY OF PRESENT ILLNESS:  [name] is a 88 y.o. female with multiple medical problems as listed below.  She presents today, with her daughter, regarding fecal smearing and constipation.  I last saw the patient Mar 29, 2016 when she underwent a screening colonoscopy.  She was found to have a diminutive ascending colon polyp which was removed and found to be a tubular adenoma.  Also noted to have left-sided diverticulosis.  Patient tells me that over the past year she has found it difficult to clean post defecation.  She will have fecal smearing for which she places toilet tissue between her buttocks.  She describes chronic intermittent constipation for which she has taken laxatives.  Sometimes resulting in diarrhea.  No significant bleeding though she may see some light pink blood on the tissue only, at times.  She does have daily bowel movements for the most part.  She also complains of chronic urinary leakage.  REVIEW OF SYSTEMS:  All non-GI ROS negative unless otherwise stated in the HPI except for arthritis, sleeping problems, night sweats  Past Medical History:  Diagnosis Date   Diabetic neuropathy (HCC)    GERD (gastroesophageal reflux disease)    HYPERLIPIDEMIA 08/13/2007   HYPERTENSION 08/13/2007   INSOMNIA 08/13/2007   Palpitations    SVD (spontaneous vaginal delivery)    x 2   Type 2 DM with ketoacidosis (HCC) 08/13/2007    Past Surgical History:  Procedure Laterality Date   ANTERIOR AND POSTERIOR REPAIR N/A 06/03/2014   Procedure: ANTERIOR (CYSTOCELE) AND POSTERIOR REPAIR (RECTOCELE);  Surgeon: Bing Plume, MD;  Location: [facility];  Service: Gynecology;  Laterality: N/A;   COLONOSCOPY     DILATION AND CURETTAGE OF UTERUS     EYE SURGERY     bilateral cataracts   LAPAROSCOPIC APPENDECTOMY  2004   Dr Marcy Panning   right hand surgery     Stress Cardiolite  02/19/2007   UMBILICAL HERNIA REPAIR  2004   Primary repair at time of appendectomy.  Dr Orson Slick   VAGINAL  HYSTERECTOMY N/A 06/03/2014   Procedure: HYSTERECTOMY VAGINAL;  Surgeon: Bing Plume, MD;  Location: [facility];  Service: Gynecology;  Laterality: N/A;  2hrs OR time    Social History [name]  reports that she has never smoked. She has never used smokeless tobacco. She reports that she does not drink alcohol and does not use drugs.  family history includes Colon cancer in her brother and sister; Diabetes in her brother, child, father, mother, and sister; Heart Problems in her father; Hypertension in her child; Kidney disease in her brother and sister; Multiple sclerosis in her child.  No Known Allergies     PHYSICAL EXAMINATION: Vital signs: BP 122/60   Pulse 67   Ht 5\' 4"  (1.626 m)   Wt 173 lb (78.5 kg)   BMI 29.70 kg/m   Constitutional: generally well-appearing elderly female, no acute distress Psychiatric: alert and oriented x3, cooperative Eyes: extraocular movements intact, anicteric, conjunctiva pink Mouth: oral pharynx moist, no lesions Neck: supple no lymphadenopathy Cardiovascular: heart regular rate and rhythm, no murmur Lungs: clear to auscultation bilaterally Abdomen: soft, nontender, nondistended, no obvious ascites, no peritoneal signs, normal bowel sounds, no organomegaly Rectal: External hemorrhoid tags.  No mass or tenderness.  Brown stool Extremities: no clubbing or cyanosis.  Trace lower extremity edema bilaterally Skin: no lesions on visible extremities Neuro: No focal deficits.  Cranial nerves intact  ASSESSMENT:  1.  Fecal smearing 2.  Constipation 3.  History of diminutive adenoma on colon [name] 2017 4.  External hemorrhoids   PLAN:  1.  Citrucel 2 tablespoons daily 2.  Anusol suppositories.  Prescribed. 3.  Return to the care of her PCP.  Urology for urinary leakage. 4.  GI follow-up as needed A total time of 45 minutes was spent caring see the patient, obtaining comprehensive history, performing medically appropriate physical exam,  counseling and educating the patient and her daughter regarding the above listed issues, ordering medication, and documenting clinical information in the health record

## 2024-02-26 NOTE — Patient Instructions (Signed)
 We have sent the following medications to your pharmacy for you to pick up at your convenience:  Anusol HC cream.  Use at bedtime for two weeks then as needed.  Purchase Preparation H suppositories over the counter.  Apply a pea sized amount of the Anusol cream to a suppository and insert rectally.  Take two tablespoons of Citrucel daily in water or juice.  _______________________________________________________  If your blood pressure at your visit was 140/90 or greater, please contact your primary care physician to follow up on this.  _______________________________________________________  If you are age 86 or older, your body mass index should be between 23-30. Your Body mass index is 29.7 kg/m. If this is out of the aforementioned range listed, please consider follow up with your Primary Care Provider.  If you are age 34 or younger, your body mass index should be between 19-25. Your Body mass index is 29.7 kg/m. If this is out of the aformentioned range listed, please consider follow up with your Primary Care Provider.   ________________________________________________________  The [place] [facility] providers would like to encourage you to use MYCHART to communicate with providers for non-urgent requests or questions.  Due to long hold times on the telephone, sending your provider a message by [facility] may be a faster and more efficient way to get a response.  Please allow 48 business hours for a response.  Please remember that this is for non-urgent requests.  _______________________________________________________

## 2024-03-08 ENCOUNTER — Encounter (HOSPITAL_BASED_OUTPATIENT_CLINIC_OR_DEPARTMENT_OTHER): Payer: Self-pay

## 2024-03-08 ENCOUNTER — Other Ambulatory Visit: Payer: Self-pay

## 2024-03-08 ENCOUNTER — Emergency Department (HOSPITAL_BASED_OUTPATIENT_CLINIC_OR_DEPARTMENT_OTHER): Admission: EM | Admit: 2024-03-08 | Discharge: 2024-03-08 | Disposition: A | Discharge status: Home / Self Care

## 2024-03-08 DIAGNOSIS — R040 Epistaxis: Secondary | ICD-10-CM | POA: Diagnosis not present

## 2024-03-08 DIAGNOSIS — R0981 Nasal congestion: Secondary | ICD-10-CM | POA: Insufficient documentation

## 2024-03-08 DIAGNOSIS — Z794 Long term (current) use of insulin: Secondary | ICD-10-CM | POA: Diagnosis not present

## 2024-03-08 NOTE — ED Triage Notes (Signed)
 Pt presents via POV c/o episode of epistaxis at 1900 while sneezing. Nose not currently bleeding. A&O x4. Ambulatory to triage.

## 2024-03-08 NOTE — ED Provider Notes (Signed)
 [place] EMERGENCY DEPARTMENT AT [facility] Provider Note   CSN: [id number] Arrival date & time: 03/08/24  2058     History  Chief Complaint  Patient presents with   Epistaxis    [name] is a 88 y.o. female.  88 year old female presenting emergency department with nosebleed prior to arrival.  Symptoms resolved.  Has had some URI symptoms the past couple days.   Epistaxis      Home Medications Prior to Admission medications   Medication Sig Start Date End Date Taking? Authorizing Provider  B-D ULTRAFINE III SHORT PEN 31G X 8 MM MISC use 3 to 4 TIMES A DAY as directed by prescriber 06/06/13   Gwyndolyn Lerner, MD  bisoprolol -hydrochlorothiazide  (ZIAC ) 2.5-6.25 MG per tablet Take 1 tablet by mouth daily. 12/05/13   Gwyndolyn Lerner, MD  Calcium  Carb-Cholecalciferol  600-200 MG-UNIT TABS Take 2 tablets by mouth daily.    [provider]  Cholecalciferol  (VITAMIN D -3) 1000 UNITS CAPS Take 1 capsule by mouth daily.    [provider]  clorazepate  (TRANXENE ) 7.5 MG tablet Take 7.5 mg by mouth 2 (two) times daily as needed for anxiety.    [provider]  diazepam  (VALIUM ) 5 MG tablet Take 1 by mouth 1 hour  pre-procedure with very light food. May bring 2nd tablet to appointment. 05/23/21   Bridget Campion, MD  dorzolamide -timolol  (COSOPT ) 22.3-6.8 MG/ML ophthalmic solution Place 1 drop into both eyes 2 (two) times daily.  02/25/18   [provider]  FARXIGA 10 MG TABS tablet Take 10 mg by mouth daily. 01/01/20   [provider]  gabapentin  (NEURONTIN ) 100 MG capsule Take 100-200 mg by mouth 2 (two) times daily as needed (pain).    [provider]  HUMALOG  KWIKPEN 100 UNIT/ML KiwkPen Inject 12-18 Units into the skin 3 (three) times daily. 01/16/17   [provider]  hydrocortisone  (ANUSOL -HC) 2.5 % rectal cream Place 1 Application rectally at bedtime. 02/26/24   Tobin Forts, MD  insulin  degludec (TRESIBA ) 100  UNIT/ML FlexTouch Pen Inject 30-45 Units into the skin daily at 10 pm.     [provider]  latanoprost  (XALATAN ) 0.005 % ophthalmic solution Place 1 drop into both eyes at bedtime.    [provider]  metoprolol  tartrate (LOPRESSOR ) 100 MG tablet Take 2 hours prior to CT 03/12/23   Morey Ar, NP  Multiple Vitamin (MULTIVITAMIN) capsule Take 1 capsule by mouth daily.    [provider]  ONE TOUCH ULTRA TEST test strip TEST twice a day - DIAGNOSIS CODE 250.01 03/24/13   Gwyndolyn Lerner, MD  OVER THE COUNTER MEDICATION Take 1 tablet by mouth daily. Focus Factor    [provider]  rosuvastatin (CRESTOR) 5 MG tablet Take 5 mg by mouth 3 (three) times a week. Patient not taking: Reported on 02/26/2024 01/12/21   [provider]  timolol  (TIMOPTIC ) 0.5 % ophthalmic solution timolol  maleate 0.5 % eye drops    [provider]  vitamin C  (ASCORBIC ACID ) 500 MG tablet Take 500 mg by mouth 2 (two) times daily.     [provider]      Allergies    Patient has no known allergies.    Review of Systems   Review of Systems  HENT:  Positive for nosebleeds.     Physical Exam Updated Vital Signs BP (!) 147/59 (BP Location: Right Arm)   Pulse 80   Temp 98.1 F (36.7 C) (Oral)   Resp 16  SpO2 95%  Physical Exam Vitals and nursing note reviewed.  Constitutional:      General: She is not in acute distress.    Appearance: She is not toxic-appearing.  HENT:     Head: Normocephalic.     Nose: Congestion present.     Comments: Some friable tissue to the right nare.  No bleeding.    Mouth/Throat:     Mouth: Mucous membranes are moist.  Eyes:     Extraocular Movements: Extraocular movements intact.     Pupils: Pupils are equal, round, and reactive to light.  Cardiovascular:     Rate and Rhythm: Normal rate and regular rhythm.  Pulmonary:     Effort: Pulmonary effort is normal.  Abdominal:     General: Abdomen is flat.   Neurological:     General: No focal deficit present.     Mental Status: She is alert and oriented to person, place, and time.  Psychiatric:        Mood and Affect: Mood normal.        Behavior: Behavior normal.     ED Results / Procedures / Treatments   Labs (all labs ordered are listed, but only abnormal results are displayed) Labs Reviewed - No data to display  EKG None  Radiology No results found.  Procedures Procedures    Medications Ordered in ED Medications - No data to display  ED Course/ Medical Decision Making/ A&P                                 Medical Decision Making 88 year old female presenting emergency department with complaint of epistaxis.  Resolved prior to arrival.  No bleeding currently.  Has had some URI symptoms past several days.  Clinically well-appearing.  Likely viral.  Discussed supportive care and return precautions.  Stable for discharge.         Final Clinical Impression(s) / ED Diagnoses Final diagnoses:  None    Rx / DC Orders ED Discharge Orders     None         Rolinda Climes, DO 03/08/24 2206

## 2024-03-08 NOTE — Discharge Instructions (Signed)
 Please follow-up with your primary doctor.  Return if develop fevers, chills, sudden onset headache, vision changes, facial droop, unilateral weakness, you know starts bleeding again you are unable to control it.  Again please hold pressure for 5 minutes if you do experience repeat nosebleeding.

## 2024-04-04 DIAGNOSIS — H401133 Primary open-angle glaucoma, bilateral, severe stage: Secondary | ICD-10-CM | POA: Diagnosis not present

## 2024-04-09 DIAGNOSIS — H02051 Trichiasis without entropian right upper eyelid: Secondary | ICD-10-CM | POA: Diagnosis not present

## 2024-04-09 DIAGNOSIS — E113292 Type 2 diabetes mellitus with mild nonproliferative diabetic retinopathy without macular edema, left eye: Secondary | ICD-10-CM | POA: Diagnosis not present

## 2024-04-09 DIAGNOSIS — H26492 Other secondary cataract, left eye: Secondary | ICD-10-CM | POA: Diagnosis not present

## 2024-04-09 DIAGNOSIS — H1789 Other corneal scars and opacities: Secondary | ICD-10-CM | POA: Diagnosis not present

## 2024-04-09 DIAGNOSIS — H401133 Primary open-angle glaucoma, bilateral, severe stage: Secondary | ICD-10-CM | POA: Diagnosis not present

## 2024-05-19 DIAGNOSIS — E118 Type 2 diabetes mellitus with unspecified complications: Secondary | ICD-10-CM | POA: Diagnosis not present

## 2024-05-19 DIAGNOSIS — Z794 Long term (current) use of insulin: Secondary | ICD-10-CM | POA: Diagnosis not present

## 2024-05-26 ENCOUNTER — Ambulatory Visit (INDEPENDENT_AMBULATORY_CARE_PROVIDER_SITE_OTHER): Admitting: Audiology

## 2024-05-26 ENCOUNTER — Ambulatory Visit (INDEPENDENT_AMBULATORY_CARE_PROVIDER_SITE_OTHER): Admitting: Physician Assistant

## 2024-05-26 ENCOUNTER — Encounter (INDEPENDENT_AMBULATORY_CARE_PROVIDER_SITE_OTHER): Payer: Self-pay | Admitting: Physician Assistant

## 2024-05-26 VITALS — BP 150/77 | HR 63

## 2024-05-26 DIAGNOSIS — H903 Sensorineural hearing loss, bilateral: Secondary | ICD-10-CM

## 2024-05-26 DIAGNOSIS — H9041 Sensorineural hearing loss, unilateral, right ear, with unrestricted hearing on the contralateral side: Secondary | ICD-10-CM

## 2024-05-26 NOTE — Progress Notes (Signed)
  [address] [place], [place] [address] [phone]  Audiological Evaluation    Name: [name]     DOB:   [birth year]      MRN:   [id number]                                                                                     Service Date: 05/26/2024     Accompanied by: daughter   Patient comes today after Reyes Cohen, PA-C sent a referral for a hearing evaluation due to concerns with hearing loss.   Symptoms Yes Details  Hearing loss  [x]  Difficulty understanding recently, known longstanding left ear hearing loss since ( maybe since birth- unclear)  Tinnitus  [x]  Right ear   Ear pain/ infections/pressure  []    Balance problems  []    Noise exposure history  []    Previous ear surgeries  []    Family history of hearing loss  []    Amplification  []    Other  []      Otoscopy: Right ear: Clear external ear canal and notable landmarks visualized on the tympanic membrane. Left ear:  Clear external ear canal and notable landmarks visualized on the tympanic membrane.  Tympanometry: Right ear: Type A- Normal external ear canal volume with normal middle ear pressure and tympanic membrane compliance. Left ear: Type A- Normal external ear canal volume with normal middle ear pressure and tympanic membrane compliance.   Pure tone Audiometry: Right ear- Normal sloping to severe sensorineural hearing loss from 125 Hz - 8000 Hz. Left ear-  Profound  sensorineural hearing loss from 125 Hz - 8000 Hz ( essentially no responses were obtained).  Speech Audiometry: Right ear- Speech Reception Threshold (SRT) was obtained at 30 dBHL. Left ear-Speech Awareness Threshold (SAT) was observed at 105 dBHL, with contralateral masking.   Word Recognition Score Tested using NU-6 (recorded) Right ear: 96 % was obtained at a presentation level of 70 dBHL without contralateral masking which is deemed as  excellent. Left ear: Could not test due to degree of hearing loss.    The  hearing test results were completed under headphones and re-checked with inserts and results are deemed to be of good to fair reliability. Fair when masking.  Test technique:  conventional    Impression: There is a significant difference in pure-tone thresholds between ears.   Recommendations: Follow up with ENT as scheduled for today. Return for a hearing evaluation if concerns with hearing changes arise or per MD recommendation. Consider a communication needs assessment after medical clearance for hearing aids is obtained.   Kayon Dozier MARIE LEROUX-MARTINEZ, AUD

## 2024-05-26 NOTE — Progress Notes (Signed)
 Dear Dr. Gerome, Here is my assessment for our mutual patient, [name]. Thank you for allowing me the opportunity to care for your patient. Please do not hesitate to contact me should you have any other questions. Sincerely, Beverly Cohen PA-C  Otolaryngology Clinic Note Referring provider: Dr. Gerome HPI:  [name] is a 88 y.o. female kindly referred by Dr. Gerome   The patient is is an 88 year old female seen in our office for evaluation of hearing loss.  The patient is companied by her daughter who helps with some of her history.  She notes that as a child she was diagnosed with unilateral hearing loss on the left side.  Uncertain etiology no significant workup at that time.  She notes that she has not been able to hear out of her left ear since she was a child.  She notes over the years she has had some progressive hearing loss out of the right.  She denies any pain, no recurrent infections, no trauma, no head or neck surgery.  She does note some dizziness with movements and standing and has been referred to physical therapy for this.   Independent Review of Additional Tests or Records:   Audiological valuation on 05/26/2024  Otoscopy: Right ear: Clear external ear canal and notable landmarks visualized on the tympanic membrane. Left ear:  Clear external ear canal and notable landmarks visualized on the tympanic membrane.   Tympanometry: Right ear: Type A- Normal external ear canal volume with normal middle ear pressure and tympanic membrane compliance. Left ear: Type A- Normal external ear canal volume with normal middle ear pressure and tympanic membrane compliance.   Pure tone Audiometry: Right ear- Normal sloping to severe sensorineural hearing loss from 125 Hz - 8000 Hz. Left ear-  Profound  sensorineural hearing loss from 125 Hz - 8000 Hz ( essentially no responses were obtained).   Speech Audiometry: Right ear- Speech Reception Threshold (SRT) was obtained  at 30 dBHL. Left ear-Speech Awareness Threshold (SAT) was observed at 105 dBHL, with contralateral masking.   Word Recognition Score Tested using NU-6 (recorded) Right ear: 96 % was obtained at a presentation level of 70 dBHL without contralateral masking which is deemed as  excellent. Left ear: Could not test due to degree of hearing loss.    The hearing test results were completed under headphones and re-checked with inserts and results are deemed to be of good to fair reliability. Fair when masking.  Test technique:  conventional     Impression: There is a significant difference in pure-tone thresholds between ears.      PMH/Meds/All/SocHx/FamHx/ROS:   Past Medical History:  Diagnosis Date   Diabetic neuropathy (HCC)    GERD (gastroesophageal reflux disease)    HYPERLIPIDEMIA 08/13/2007   HYPERTENSION 08/13/2007   INSOMNIA 08/13/2007   Palpitations    SVD (spontaneous vaginal delivery)    x 2   Type 2 DM with ketoacidosis (HCC) 08/13/2007     Past Surgical History:  Procedure Laterality Date   ANTERIOR AND POSTERIOR REPAIR N/A 06/03/2014   Procedure: ANTERIOR (CYSTOCELE) AND POSTERIOR REPAIR (RECTOCELE);  Surgeon: Debby JULIANNA Lares, MD;  Location: [facility];  Service: Gynecology;  Laterality: N/A;   COLONOSCOPY     DILATION AND CURETTAGE OF UTERUS     EYE SURGERY     bilateral cataracts   LAPAROSCOPIC APPENDECTOMY  2004   Dr Zell Sink   right hand surgery     Stress Cardiolite   02/19/2007   UMBILICAL HERNIA  REPAIR  2004   Primary repair at time of appendectomy.  Dr Effie   VAGINAL HYSTERECTOMY N/A 06/03/2014   Procedure: HYSTERECTOMY VAGINAL;  Surgeon: Debby JULIANNA Lares, MD;  Location: [facility];  Service: Gynecology;  Laterality: N/A;  2hrs OR time    Family History  Problem Relation Age of Onset   Kidney disease Brother    Diabetes Brother    Colon cancer Brother    Diabetes Mother    Diabetes Father    Heart Problems Father    Kidney disease Sister    Colon cancer  Sister    Diabetes Sister    Multiple sclerosis Child    Hypertension Child        x2   Diabetes Child        x2     Social Connections: Not on file      Current Outpatient Medications:    B-D ULTRAFINE III SHORT PEN 31G X 8 MM MISC, use 3 to 4 TIMES A DAY as directed by prescriber, Disp: 100 each, Rfl: 6   bisoprolol -hydrochlorothiazide  (ZIAC ) 2.5-6.25 MG per tablet, Take 1 tablet by mouth daily., Disp: 30 tablet, Rfl: 0   Calcium  Carb-Cholecalciferol  600-200 MG-UNIT TABS, Take 2 tablets by mouth daily., Disp: , Rfl:    Cholecalciferol  (VITAMIN D -3) 1000 UNITS CAPS, Take 1 capsule by mouth daily., Disp: , Rfl:    clorazepate  (TRANXENE ) 7.5 MG tablet, Take 7.5 mg by mouth 2 (two) times daily as needed for anxiety., Disp: , Rfl:    diazepam  (VALIUM ) 5 MG tablet, Take 1 by mouth 1 hour  pre-procedure with very light food. May bring 2nd tablet to appointment., Disp: 2 tablet, Rfl: 0   dorzolamide -timolol  (COSOPT ) 22.3-6.8 MG/ML ophthalmic solution, Place 1 drop into both eyes 2 (two) times daily. , Disp: , Rfl: 3   FARXIGA 10 MG TABS tablet, Take 10 mg by mouth daily., Disp: , Rfl:    gabapentin  (NEURONTIN ) 100 MG capsule, Take 100-200 mg by mouth 2 (two) times daily as needed (pain)., Disp: , Rfl:    HUMALOG  KWIKPEN 100 UNIT/ML KiwkPen, Inject 12-18 Units into the skin 3 (three) times daily., Disp: , Rfl: 0   hydrocortisone  (ANUSOL -HC) 2.5 % rectal cream, Place 1 Application rectally at bedtime., Disp: 30 g, Rfl: 1   insulin  degludec (TRESIBA ) 100 UNIT/ML FlexTouch Pen, Inject 30-45 Units into the skin daily at 10 pm. , Disp: , Rfl:    latanoprost  (XALATAN ) 0.005 % ophthalmic solution, Place 1 drop into both eyes at bedtime., Disp: , Rfl:    metoprolol  tartrate (LOPRESSOR ) 100 MG tablet, Take 2 hours prior to CT, Disp: 1 tablet, Rfl: 0   Multiple Vitamin (MULTIVITAMIN) capsule, Take 1 capsule by mouth daily., Disp: , Rfl:    ONE TOUCH ULTRA TEST test strip, TEST twice a day - DIAGNOSIS  CODE 250.01, Disp: 100 each, Rfl: 11   OVER THE COUNTER MEDICATION, Take 1 tablet by mouth daily. Focus Factor, Disp: , Rfl:    rosuvastatin (CRESTOR) 5 MG tablet, Take 5 mg by mouth 3 (three) times a week. (Patient not taking: Reported on 02/26/2024), Disp: , Rfl:    timolol  (TIMOPTIC ) 0.5 % ophthalmic solution, timolol  maleate 0.5 % eye drops, Disp: , Rfl:    vitamin C  (ASCORBIC ACID ) 500 MG tablet, Take 500 mg by mouth 2 (two) times daily. , Disp: , Rfl:    Physical Exam:   BP (!) 150/77   Pulse 63   SpO2 95%  Pertinent Findings CN II-XII intact Bilateral EAC clear and TM intact with well pneumatized middle ear spaces  Anterior rhinoscopy: Septum midline; bilateral inferior turbinates with no hypertrophy No lesions of oral cavity/oropharynx No obviously palpable neck masses/lymphadenopathy/thyromegaly No respiratory distress or stridor  Seprately Identifiable Procedures:  None  Impression & Plans:  [name] is a 88 y.o. female with the following   Hearing loss-  88 year old female seen today for evaluation of hearing.  She lost hearing as a child, this is evidenced on her audiogram on the left.  Her right shows rather significant sensorineural hearing loss.  She has not had any significant workup for the hearing loss as a child.  I discussed options including further evaluation with imaging for the hearing loss.  Given her age and the fact that this has been present for such a long period of time likely the results will not change our plan.  The patient would not like any further evaluation or management for the left-sided hearing loss.  We will focus on the right side I think she would benefit from hearing aids, she will follow-up with local providers, am happy to see her back in the office with any further questions or concerns she may have.  The patient and her daughter who was present verbalized understanding and agreement to today's plan.   - f/u PRN   Thank you for  allowing me the opportunity to care for your patient. Please do not hesitate to contact me should you have any other questions.  Sincerely, Beverly Cohen PA-C [facility] Phone: [phone] Fax: [phone]  05/26/2024, 11:19 AM

## 2024-06-12 DIAGNOSIS — H905 Unspecified sensorineural hearing loss: Secondary | ICD-10-CM | POA: Diagnosis not present

## 2024-06-18 DIAGNOSIS — Z794 Long term (current) use of insulin: Secondary | ICD-10-CM | POA: Diagnosis not present

## 2024-06-18 DIAGNOSIS — E118 Type 2 diabetes mellitus with unspecified complications: Secondary | ICD-10-CM | POA: Diagnosis not present

## 2024-07-18 DIAGNOSIS — Z794 Long term (current) use of insulin: Secondary | ICD-10-CM | POA: Diagnosis not present

## 2024-07-18 DIAGNOSIS — E118 Type 2 diabetes mellitus with unspecified complications: Secondary | ICD-10-CM | POA: Diagnosis not present

## 2024-08-13 DIAGNOSIS — H90A22 Sensorineural hearing loss, unilateral, left ear, with restricted hearing on the contralateral side: Secondary | ICD-10-CM | POA: Diagnosis not present

## 2024-08-13 DIAGNOSIS — H9313 Tinnitus, bilateral: Secondary | ICD-10-CM | POA: Diagnosis not present

## 2024-08-14 DIAGNOSIS — E7800 Pure hypercholesterolemia, unspecified: Secondary | ICD-10-CM | POA: Diagnosis not present

## 2024-08-14 DIAGNOSIS — E1122 Type 2 diabetes mellitus with diabetic chronic kidney disease: Secondary | ICD-10-CM | POA: Diagnosis not present

## 2024-08-14 DIAGNOSIS — Z23 Encounter for immunization: Secondary | ICD-10-CM | POA: Diagnosis not present

## 2024-08-14 DIAGNOSIS — I10 Essential (primary) hypertension: Secondary | ICD-10-CM | POA: Diagnosis not present

## 2024-08-14 DIAGNOSIS — J309 Allergic rhinitis, unspecified: Secondary | ICD-10-CM | POA: Diagnosis not present

## 2024-08-19 DIAGNOSIS — E118 Type 2 diabetes mellitus with unspecified complications: Secondary | ICD-10-CM | POA: Diagnosis not present

## 2024-08-19 DIAGNOSIS — Z794 Long term (current) use of insulin: Secondary | ICD-10-CM | POA: Diagnosis not present

## 2024-08-27 NOTE — Progress Notes (Unsigned)
 Cardiology Office Note:    Date:  08/28/2024   ID:  [name], DOB 28-Jan-1936, MRN [id number]  PCP:  Gerome Brunet, DO   [facility] Providers Cardiologist:  Vinie JAYSON Maxcy, MD     Referring MD: Gerome Brunet, DO   Chief complaint: Annual follow-up  History of Present Illness:    [name] is a 88 y.o. female with a hx of chest pain with normal stress test (2014), HTN, HLD, GERD, T2DM presents the office today for annual follow-up.  Patient initially seen at our office in 2014 by Dr. Maxcy with complaints of chest pressure beginning in the upper abdominal/substernal regions.  NM stress test on 09/24/2013 was negative for ischemia with EF 87%.  Complained of palpitations lasting anywhere from a few seconds to less than a minute without other cardiac complaints in 2019.  Echo 09/20/2018: LVEF 65-70%.  LV with vigorous systolic function, no RWMA, G1 DD, aortic valve mildly thickened with moderately calcified leaflets and mild regurgitation.  Mild regurgitation of the tricuspid valve.  In 2022 patient was found to not be taking her atorvastatin .  Her PCP switched her to rosuvastatin 5 mg 3 times weekly, with an elevation in LDL.  She had also stopped taking daily aspirin  because she occasionally took a full dose aspirin , Dr. Maxcy provided encouragement to take aspirin  regularly.  Last seen by cardiology on 02/2023.  Patient's daughter had contacted our triage services with complaints that patient was having chest pain.  In office it was described as a weeklong history of intermittent exertional central chest pain radiating down left arm/shoulder with no associated symptoms.  Improved with full-strength aspirin .  Coronary CTA was ordered.  Coronary CTA on 03/23/2023: Coronary calcium  score of 0.  Normal RCA and LAD.  Left main and LCx are nondiagnostic due to slab artifact.  This is a nondiagnostic study, obstructive CAD could not be excluded, alternative evaluation  was recommended.  Patient was notified of results and was told she would need a PET stress test if symptoms continued, patient reported she was no longer having symptoms.  Patient presents with her daughter to clinic today, appears stable from a cardiovascular standpoint. She denies chest pain, palpitations, dyspnea, orthopnea, n, v, dark/tarry/bloody stools, hematuria,  syncope, edema, weight gain.  Reports some lightheadedness with positional changes.  Takes her blood pressure at home, cannot remember the exact numbers on a regular basis.  Recently had some issues with her hearing and difficulties with hearing aids that are been frustrating for her.  Has not had any further episodes of chest pain since last year.  Receives most of her medical care with her PCP.  Past Medical History:  Diagnosis Date   Diabetic neuropathy (HCC)    GERD (gastroesophageal reflux disease)    HYPERLIPIDEMIA 08/13/2007   HYPERTENSION 08/13/2007   INSOMNIA 08/13/2007   Palpitations    SVD (spontaneous vaginal delivery)    x 2   Type 2 DM with ketoacidosis (HCC) 08/13/2007    Past Surgical History:  Procedure Laterality Date   ANTERIOR AND POSTERIOR REPAIR N/A 06/03/2014   Procedure: ANTERIOR (CYSTOCELE) AND POSTERIOR REPAIR (RECTOCELE);  Surgeon: Debby JULIANNA Lares, MD;  Location: [facility];  Service: Gynecology;  Laterality: N/A;   COLONOSCOPY     DILATION AND CURETTAGE OF UTERUS     EYE SURGERY     bilateral cataracts   LAPAROSCOPIC APPENDECTOMY  2004   Dr Zell Sink   right hand surgery  Stress Cardiolite   02/19/2007   UMBILICAL HERNIA REPAIR  2004   Primary repair at time of appendectomy.  Dr Effie   VAGINAL HYSTERECTOMY N/A 06/03/2014   Procedure: HYSTERECTOMY VAGINAL;  Surgeon: Debby JULIANNA Lares, MD;  Location: [facility];  Service: Gynecology;  Laterality: N/A;  2hrs OR time    Current Medications: Current Meds  Medication Sig   B-D ULTRAFINE III SHORT PEN 31G X 8 MM MISC use 3 to 4 TIMES A DAY as directed  by prescriber   bisoprolol -hydrochlorothiazide  (ZIAC ) 2.5-6.25 MG per tablet Take 1 tablet by mouth daily.   Calcium  Carb-Cholecalciferol  600-200 MG-UNIT TABS Take 2 tablets by mouth daily.   clorazepate  (TRANXENE ) 7.5 MG tablet Take 7.5 mg by mouth 2 (two) times daily as needed for anxiety.   dorzolamide -timolol  (COSOPT ) 22.3-6.8 MG/ML ophthalmic solution Place 1 drop into both eyes 2 (two) times daily.    FARXIGA 10 MG TABS tablet Take 10 mg by mouth daily.   gabapentin  (NEURONTIN ) 100 MG capsule Take 100-200 mg by mouth 2 (two) times daily as needed (pain).   HUMALOG  KWIKPEN 100 UNIT/ML KiwkPen Inject 12-18 Units into the skin 3 (three) times daily.   hydrocortisone  (ANUSOL -HC) 2.5 % rectal cream Place 1 Application rectally at bedtime.   insulin  degludec (TRESIBA ) 100 UNIT/ML FlexTouch Pen Inject 30-45 Units into the skin daily at 10 pm.    latanoprost  (XALATAN ) 0.005 % ophthalmic solution Place 1 drop into both eyes at bedtime.   metoprolol  tartrate (LOPRESSOR ) 100 MG tablet Take 2 hours prior to CT   Multiple Vitamin (MULTIVITAMIN) capsule Take 1 capsule by mouth daily.   ONE TOUCH ULTRA TEST test strip TEST twice a day - DIAGNOSIS CODE 250.01   OVER THE COUNTER MEDICATION Take 1 tablet by mouth daily. Focus Factor   rosuvastatin (CRESTOR) 5 MG tablet Take 5 mg by mouth 3 (three) times a week.   timolol  (TIMOPTIC ) 0.5 % ophthalmic solution timolol  maleate 0.5 % eye drops   vitamin C  (ASCORBIC ACID ) 500 MG tablet Take 500 mg by mouth 2 (two) times daily.      Allergies:   Patient has no known allergies.   Social History   Socioeconomic History   Marital status: Widowed    Spouse name: Not on file   Number of children: 2   Years of education: Not on file   Highest education level: Not on file  Occupational History   Not on file  Tobacco Use   Smoking status: Never   Smokeless tobacco: Never  Vaping Use   Vaping status: Never Used  Substance and Sexual Activity   Alcohol use:  No   Drug use: No   Sexual activity: Yes    Birth control/protection: Post-menopausal  Other Topics Concern   Not on file  Social History Narrative   Not on file   Social Drivers of Health   Financial Resource Strain: Low Risk  (09/20/2018)   Overall Financial Resource Strain (CARDIA)    Difficulty of Paying Living Expenses: Not very hard  Food Insecurity: No Food Insecurity (09/20/2018)   Hunger Vital Sign    Worried About Running Out of Food in the Last Year: Never true    Ran Out of Food in the Last Year: Never true  Transportation Needs: No Transportation Needs (09/20/2018)   PRAPARE - Administrator, Civil Service (Medical): No    Lack of Transportation (Non-Medical): No  Physical Activity: Unknown (09/20/2018)   Exercise Vital Sign  Days of Exercise per Week: Patient declined    Minutes of Exercise per Session: Patient declined  Stress: Not on file  Social Connections: Not on file     Family History: The patient's family history includes Colon cancer in her brother and sister; Diabetes in her brother, child, father, mother, and sister; Heart Problems in her father; Hypertension in her child; Kidney disease in her brother and sister; Multiple sclerosis in her child.  ROS:   Please see the history of present illness.     All other systems reviewed and are negative.  EKGs/Labs/Other Studies Reviewed:    The following studies were reviewed today:  EKG Interpretation Date/Time:  Thursday August 28 2024 13:20:01 EDT Ventricular Rate:  62 PR Interval:  200 QRS Duration:  78 QT Interval:  380 QTC Calculation: 385 R Axis:   17  Text Interpretation: Normal sinus rhythm Nonspecific T wave abnormality When compared with ECG of 20-Sep-2018 06:50, QT has shortened Confirmed by Farrell Broerman [phone]) on 08/28/2024 1:34:49 PM    Recent Labs: No results found for requested labs within last 365 days.  Recent Lipid Panel    Component Value Date/Time   CHOL 156  02/08/2021 1440   TRIG 200 (H) 02/08/2021 1440   HDL 43 02/08/2021 1440   CHOLHDL 3.6 02/08/2021 1440   LDLCALC 79 02/08/2021 1440     Physical Exam:    VS:  BP 136/64   Pulse 64   Ht 5' 4 (1.626 m)   Wt 174 lb 6.4 oz (79.1 kg)   SpO2 96%   BMI 29.94 kg/m     Wt Readings from Last 3 Encounters:  08/28/24 174 lb 6.4 oz (79.1 kg)  02/26/24 173 lb (78.5 kg)  03/12/23 171 lb (77.6 kg)     GEN:  Well nourished, well developed in no acute distress HEENT: Normal NECK: No JVD; No carotid bruits CARDIAC: RRR, no murmurs, rubs, gallops RESPIRATORY:  Clear to auscultation without rales, wheezing or rhonchi  ABDOMEN: Soft, non-tender, non-distended MUSCULOSKELETAL:  No edema; No deformity  SKIN: Warm and dry NEUROLOGIC:  Alert and oriented x 3 PSYCHIATRIC:  Normal affect   ASSESSMENT:    1. Primary hypertension   2. Hyperlipidemia, unspecified hyperlipidemia type    PLAN:    In order of problems listed above:  Hypertension Reports occasional dizziness with standing Denies chest pain, SOB, near-syncope, headache, blurred vision, flushing Orthostatic vital signs negative in clinic today Advised patient to check BP a few times a week and keep a log to bring with her to next visit Continue bisoprolol -hydrochlorothiazide  2.5-6.25 daily Labs 02/08/2024: Creatinine 1.15  Hyperlipidemia Currently managed by PCP Lipid panel 10/23/2023: Cholesterol 144, triglycerides 162, LDL 74, HDL 42 Continue rosuvastatin as prescribed by your PCP  Follow-up in 6 months to reevaluate blood pressure/lightheadedness, sooner if new symptoms occur    Medication Adjustments/Labs and Tests Ordered: Current medicines are reviewed at length with the patient today.  Concerns regarding medicines are outlined above.  Orders Placed This Encounter  Procedures   EKG 12-Lead   No orders of the defined types were placed in this encounter.   Patient Instructions   Horatio you for choosing   [facility]!     Medication Instructions:  No medication changes were made during today's visit.  *If you need a refill on your cardiac medications before your next appointment, please call your pharmacy*   Lab Work: No labs were ordered during today's visit.  If you have labs (  blood work) drawn today and your tests are completely normal, you will receive your results only by: MyChart Message (if you have MyChart) OR A paper copy in the mail If you have any lab test that is abnormal or we need to change your treatment, we will call you to review the results.   Testing/Procedures: No procedures were ordered during today's visit.   Your next appointment:  A letter will be mailed to you as a reminder to call the office for your next follow up appointment.  6 month(s)   Provider:   Vinie JAYSON Maxcy, MD     Follow-Up: At [facility], you and your health needs are our priority.  As part of our continuing mission to provide you with exceptional heart care, we have created designated Provider Care Teams.  These Care Teams include your primary Cardiologist (physician) and Advanced Practice Providers (APPs -  Physician Assistants and Nurse Practitioners) who all work together to provide you with the care you need, when you need it. We recommend signing up for the patient portal called MyChart.  Sign up information is provided on this After Visit Summary.  MyChart is used to connect with patients for Virtual Visits (Telemedicine).  Patients are able to view lab/test results, encounter notes, upcoming appointments, etc.  Non-urgent messages can be sent to your provider as well.   To learn more about what you can do with MyChart, go to [email or web address].      Signed, Miriam FORBES Shams, NP  08/28/2024 2:48 PM    [facility]

## 2024-08-28 ENCOUNTER — Encounter: Payer: Self-pay | Admitting: Physician Assistant

## 2024-08-28 ENCOUNTER — Ambulatory Visit: Attending: Physician Assistant | Admitting: Emergency Medicine

## 2024-08-28 VITALS — BP 136/64 | HR 64 | Ht 64.0 in | Wt 174.4 lb

## 2024-08-28 DIAGNOSIS — E785 Hyperlipidemia, unspecified: Secondary | ICD-10-CM | POA: Diagnosis not present

## 2024-08-28 DIAGNOSIS — I10 Essential (primary) hypertension: Secondary | ICD-10-CM

## 2024-08-28 NOTE — Patient Instructions (Addendum)
 Horatio you for choosing [facility]!     Medication Instructions:  No medication changes were made during today's visit.  *If you need a refill on your cardiac medications before your next appointment, please call your pharmacy*   Lab Work: No labs were ordered during today's visit.  If you have labs (blood work) drawn today and your tests are completely normal, you will receive your results only by: MyChart Message (if you have MyChart) OR A paper copy in the mail If you have any lab test that is abnormal or we need to change your treatment, we will call you to review the results.   Testing/Procedures: No procedures were ordered during today's visit.   Your next appointment:  A letter will be mailed to you as a reminder to call the office for your next follow up appointment.  6 month(s)   Provider:   Vinie JAYSON Maxcy, MD     Follow-Up: At [facility], you and your health needs are our priority.  As part of our continuing mission to provide you with exceptional heart care, we have created designated Provider Care Teams.  These Care Teams include your primary Cardiologist (physician) and Advanced Practice Providers (APPs -  Physician Assistants and Nurse Practitioners) who all work together to provide you with the care you need, when you need it. We recommend signing up for the patient portal called MyChart.  Sign up information is provided on this After Visit Summary.  MyChart is used to connect with patients for Virtual Visits (Telemedicine).  Patients are able to view lab/test results, encounter notes, upcoming appointments, etc.  Non-urgent messages can be sent to your provider as well.   To learn more about what you can do with MyChart, go to [email or web address].
